# Patient Record
Sex: Male | Born: 1945
Health system: Southern US, Community
[De-identification: ages and names within clinical notes are randomized; demographics above are authoritative.]

## PROBLEM LIST (undated history)

## (undated) DIAGNOSIS — IMO0002 Reserved for concepts with insufficient information to code with codable children: Secondary | ICD-10-CM

## (undated) DIAGNOSIS — M1990 Unspecified osteoarthritis, unspecified site: Secondary | ICD-10-CM

## (undated) DIAGNOSIS — K219 Gastro-esophageal reflux disease without esophagitis: Secondary | ICD-10-CM

## (undated) DIAGNOSIS — E0500 Thyrotoxicosis with diffuse goiter without thyrotoxic crisis or storm: Secondary | ICD-10-CM

## (undated) DIAGNOSIS — R011 Cardiac murmur, unspecified: Secondary | ICD-10-CM

## (undated) DIAGNOSIS — E079 Disorder of thyroid, unspecified: Secondary | ICD-10-CM

## (undated) DIAGNOSIS — J189 Pneumonia, unspecified organism: Secondary | ICD-10-CM

## (undated) DIAGNOSIS — G4730 Sleep apnea, unspecified: Secondary | ICD-10-CM

## (undated) DIAGNOSIS — C61 Malignant neoplasm of prostate: Secondary | ICD-10-CM

## (undated) DIAGNOSIS — Z8719 Personal history of other diseases of the digestive system: Secondary | ICD-10-CM

## (undated) DIAGNOSIS — H269 Unspecified cataract: Secondary | ICD-10-CM

## (undated) DIAGNOSIS — D126 Benign neoplasm of colon, unspecified: Secondary | ICD-10-CM

## (undated) DIAGNOSIS — E785 Hyperlipidemia, unspecified: Secondary | ICD-10-CM

## (undated) DIAGNOSIS — F419 Anxiety disorder, unspecified: Secondary | ICD-10-CM

## (undated) DIAGNOSIS — T7840XA Allergy, unspecified, initial encounter: Secondary | ICD-10-CM

## (undated) HISTORY — PX: COLONOSCOPY: SHX174

## (undated) HISTORY — DX: Anxiety disorder, unspecified: F41.9

## (undated) HISTORY — DX: Disorder of thyroid, unspecified: E07.9

## (undated) HISTORY — DX: Gastro-esophageal reflux disease without esophagitis: K21.9

## (undated) HISTORY — DX: Pneumonia, unspecified organism: J18.9

## (undated) HISTORY — DX: Reserved for concepts with insufficient information to code with codable children: IMO0002

## (undated) HISTORY — DX: Hyperlipidemia, unspecified: E78.5

## (undated) HISTORY — PX: TONSILLECTOMY: SUR1361

## (undated) HISTORY — DX: Unspecified cataract: H26.9

## (undated) HISTORY — PX: CATARACT EXTRACTION W/ INTRAOCULAR LENS  IMPLANT, BILATERAL: SHX1307

## (undated) HISTORY — DX: Allergy, unspecified, initial encounter: T78.40XA

## (undated) HISTORY — PX: PROSTATE BIOPSY: SHX241

## (undated) HISTORY — PX: POLYPECTOMY: SHX149

## (undated) HISTORY — DX: Thyrotoxicosis with diffuse goiter without thyrotoxic crisis or storm: E05.00

## (undated) HISTORY — PX: UPPER GASTROINTESTINAL ENDOSCOPY: SHX188

## (undated) HISTORY — DX: Benign neoplasm of colon, unspecified: D12.6

## (undated) HISTORY — DX: Malignant neoplasm of prostate: C61

---

## 2001-08-12 ENCOUNTER — Other Ambulatory Visit: Admission: RE | Admit: 2001-08-12 | Discharge: 2001-08-12 | Payer: Self-pay | Admitting: *Deleted

## 2005-02-04 ENCOUNTER — Ambulatory Visit: Payer: Self-pay | Admitting: Gastroenterology

## 2005-02-13 ENCOUNTER — Ambulatory Visit: Payer: Self-pay | Admitting: Gastroenterology

## 2005-02-25 DIAGNOSIS — R011 Cardiac murmur, unspecified: Secondary | ICD-10-CM

## 2005-02-25 DIAGNOSIS — G4730 Sleep apnea, unspecified: Secondary | ICD-10-CM

## 2005-02-25 HISTORY — DX: Cardiac murmur, unspecified: R01.1

## 2005-02-25 HISTORY — DX: Sleep apnea, unspecified: G47.30

## 2010-02-05 ENCOUNTER — Encounter: Payer: Self-pay | Admitting: Gastroenterology

## 2010-03-29 NOTE — Letter (Signed)
Summary: Colonoscopy Letter  [facility]  [address]   [place], [place] [address]   Phone: [phone]  Fax: [phone]      February 05, 2010 MRN: [id number]   [facility] [address] [place], [place]  [address]   Dear Hunter Porter,   According to your medical record, it is time for you to schedule a Colonoscopy. The American Cancer Society recommends this procedure as a method to detect early colon cancer. Patients with a family history of colon cancer, or a personal history of colon polyps or inflammatory bowel disease are at increased risk.  This letter has been generated based on the recommendations made at the time of your procedure. If you feel that in your particular situation this may no longer apply, please contact our office.  Please call our office at [phone] to schedule this appointment or to update your records at your earliest convenience.  Thank you for cooperating with [place] to provide you with the very best care possible.   Sincerely,  Claudette Head, M.D.  [facility] [phone]

## 2010-09-19 ENCOUNTER — Encounter: Payer: Self-pay | Admitting: Gastroenterology

## 2010-11-02 ENCOUNTER — Encounter: Payer: Self-pay | Admitting: Gastroenterology

## 2010-11-02 ENCOUNTER — Ambulatory Visit (AMBULATORY_SURGERY_CENTER): Payer: 59 | Admitting: *Deleted

## 2010-11-02 VITALS — Ht 69.0 in | Wt 215.0 lb

## 2010-11-02 DIAGNOSIS — Z1211 Encounter for screening for malignant neoplasm of colon: Secondary | ICD-10-CM

## 2010-11-02 MED ORDER — SUPREP BOWEL PREP KIT 17.5-3.13-1.6 GM/177ML PO SOLN: 1.0000 | ORAL | Status: DC

## 2010-11-16 ENCOUNTER — Encounter: Payer: Self-pay | Admitting: Gastroenterology

## 2010-11-16 ENCOUNTER — Ambulatory Visit (AMBULATORY_SURGERY_CENTER): Payer: 59 | Admitting: Gastroenterology

## 2010-11-16 DIAGNOSIS — Z1211 Encounter for screening for malignant neoplasm of colon: Secondary | ICD-10-CM

## 2010-11-16 DIAGNOSIS — D126 Benign neoplasm of colon, unspecified: Secondary | ICD-10-CM

## 2010-11-16 DIAGNOSIS — Z86010 Personal history of colonic polyps: Secondary | ICD-10-CM

## 2010-11-16 MED ORDER — SODIUM CHLORIDE 0.9 % IV SOLN: 500.0000 mL | INTRAVENOUS | Status: DC

## 2010-11-16 NOTE — Patient Instructions (Signed)
  Follow your discharge instructions.  Continue your medications.  Await Pathology results.

## 2010-11-19 ENCOUNTER — Telehealth: Payer: Self-pay | Admitting: *Deleted

## 2010-11-19 NOTE — Telephone Encounter (Signed)
No answer, no identifier, no message left for the patient.

## 2010-11-21 ENCOUNTER — Encounter: Payer: Self-pay | Admitting: Gastroenterology

## 2011-02-14 ENCOUNTER — Other Ambulatory Visit: Payer: Self-pay | Admitting: Family Medicine

## 2011-02-14 NOTE — Telephone Encounter (Signed)
Please advise refill on Xanax 

## 2012-09-02 ENCOUNTER — Other Ambulatory Visit: Payer: Self-pay | Admitting: Physician Assistant

## 2012-09-02 ENCOUNTER — Encounter: Payer: Self-pay | Admitting: Family Medicine

## 2012-09-02 NOTE — Telephone Encounter (Signed)
Refill given for one month.  Pt due to be seen and also needs fasting labs he did not have done after last visit.  Reminders letters for both sent to patient

## 2012-10-05 ENCOUNTER — Other Ambulatory Visit: Payer: Medicare Other

## 2012-10-05 DIAGNOSIS — E538 Deficiency of other specified B group vitamins: Secondary | ICD-10-CM

## 2012-10-05 DIAGNOSIS — R5381 Other malaise: Secondary | ICD-10-CM

## 2012-10-05 DIAGNOSIS — F419 Anxiety disorder, unspecified: Secondary | ICD-10-CM

## 2012-10-05 DIAGNOSIS — Z79899 Other long term (current) drug therapy: Secondary | ICD-10-CM

## 2012-10-05 DIAGNOSIS — E0500 Thyrotoxicosis with diffuse goiter without thyrotoxic crisis or storm: Secondary | ICD-10-CM

## 2012-10-05 DIAGNOSIS — M1990 Unspecified osteoarthritis, unspecified site: Secondary | ICD-10-CM

## 2012-10-05 DIAGNOSIS — K219 Gastro-esophageal reflux disease without esophagitis: Secondary | ICD-10-CM

## 2012-10-05 DIAGNOSIS — E785 Hyperlipidemia, unspecified: Secondary | ICD-10-CM

## 2012-10-05 LAB — CBC WITH DIFFERENTIAL/PLATELET
Eosinophils Absolute: 0.3 10*3/uL (ref 0.0–0.7)
Eosinophils Relative: 4 % (ref 0–5)
Hemoglobin: 15 g/dL (ref 13.0–17.0)
Lymphocytes Relative: 29 % (ref 12–46)
Lymphs Abs: 2 10*3/uL (ref 0.7–4.0)
MCH: 32.8 pg (ref 26.0–34.0)
MCV: 93 fL (ref 78.0–100.0)
Monocytes Relative: 7 % (ref 3–12)
Neutrophils Relative %: 59 % (ref 43–77)
Platelets: 235 10*3/uL (ref 150–400)
RBC: 4.58 MIL/uL (ref 4.22–5.81)
WBC: 6.7 10*3/uL (ref 4.0–10.5)

## 2012-10-05 LAB — LIPID PANEL
Cholesterol: 209 mg/dL — ABNORMAL HIGH (ref 0–200)
HDL: 48 mg/dL (ref >39)
Total CHOL/HDL Ratio: 4.4 Ratio
VLDL: 49 mg/dL — ABNORMAL HIGH (ref 0–40)

## 2012-10-09 ENCOUNTER — Other Ambulatory Visit: Payer: Self-pay | Admitting: Family Medicine

## 2012-10-09 MED ORDER — NAPROXEN 375 MG PO TABS: 375.0000 mg | ORAL_TABLET | Freq: Two times a day (BID) | ORAL | Status: DC

## 2012-10-09 NOTE — Telephone Encounter (Signed)
Pt has appt next week.  Out of Naproxen having arthritic pain.  One refill sent to pharmacy

## 2012-10-14 ENCOUNTER — Ambulatory Visit (INDEPENDENT_AMBULATORY_CARE_PROVIDER_SITE_OTHER): Payer: Medicare Other | Admitting: Physician Assistant

## 2012-10-14 ENCOUNTER — Encounter: Payer: Self-pay | Admitting: Physician Assistant

## 2012-10-14 VITALS — BP 130/80 | HR 68 | Temp 97.9°F | Resp 18 | Ht 67.5 in | Wt 224.0 lb

## 2012-10-14 DIAGNOSIS — K219 Gastro-esophageal reflux disease without esophagitis: Secondary | ICD-10-CM | POA: Insufficient documentation

## 2012-10-14 DIAGNOSIS — F419 Anxiety disorder, unspecified: Secondary | ICD-10-CM

## 2012-10-14 DIAGNOSIS — R972 Elevated prostate specific antigen [PSA]: Secondary | ICD-10-CM

## 2012-10-14 DIAGNOSIS — E079 Disorder of thyroid, unspecified: Secondary | ICD-10-CM

## 2012-10-14 DIAGNOSIS — F411 Generalized anxiety disorder: Secondary | ICD-10-CM

## 2012-10-14 NOTE — Progress Notes (Signed)
Patient ID: Hunter Porter MRN: [id number], DOB: [birth year], 67 y.o. Date of Encounter: @DATE @  Chief Complaint:  Chief Complaint  Patient presents with  . follow up after lab work    HPI: 67 y.o. year old white male  presents for routine office visits.  He recently came in had labs done. He asked the lab to check a testosterone level also because of recent decreased energy. He had recently heard about low testosterone as a possible cause of this type of symptom. Otherwise he has no complaints today. He states that his reflux has been well controlled with the Nexium. He has continued to have routine followup at [facility] regarding his Luiz Blare' disease but this has not required any type of treatments recently. He does report that he drinks about 6 beers each evening. No other type of alcohol and no increased amount on the weekends. He continues to state that he rarely uses Xanax. He says when he does need one he only takes one fourth of a tablet at a time. He is retired and plays golf 4 days a week. He cleans the house and does yard work.   Past Medical History  Diagnosis Date  . Hyperlipidemia   . Substance abuse   . Ulcer approx 1970    HX peptic ulcer  . Adenomatous colon polyp   . GERD (gastroesophageal reflux disease)   . Anxiety   . Thyroid disease     graves     Home Meds: See attached medication section for current medication list. Any medications entered into computer today will not appear on this note's list. The medications listed below were entered prior to today. Current Outpatient Prescriptions on File Prior to Visit  Medication Sig Dispense Refill  . aspirin 81 MG tablet Take 81 mg by mouth daily.        . fish oil-omega-3 fatty acids 1000 MG capsule Take 1 g by mouth every other day.        . Multiple Vitamins-Minerals (MULTIVITAMIN WITH MINERALS) tablet Take 1 tablet by mouth daily.        . naproxen (NAPROSYN) 375 MG tablet Take 1 tablet (375 mg total) by mouth 2  (two) times daily with a meal.  60 tablet  0  . NEXIUM 40 MG capsule Take 1 tablet by mouth Daily.      . Potassium 99 MG TABS Take 1 tablet by mouth daily.        . vitamin E (VITAMIN E) 400 UNIT capsule Take 400 Units by mouth every other day.         No current facility-administered medications on file prior to visit.    Allergies:  Allergies  Allergen Reactions  . Penicillins Rash    History   Social History  . Marital Status: Married    Spouse Name: N/A    Number of Children: N/A  . Years of Education: N/A   Occupational History  . Not on file.   Social History Main Topics  . Smoking status: Current Every Day Smoker -- 0.80 packs/day    Types: Cigarettes  . Smokeless tobacco: Never Used  . Alcohol Use: 25.2 oz/week    42 Cans of beer per week  . Drug Use: No  . Sexual Activity: Not on file   Other Topics Concern  . Not on file   Social History Narrative   Retired.    Plays golf 4 days/week.   Works in yard.    Drinks  6 beers each evening.    History reviewed. No pertinent family history.   Review of Systems:  See HPI for pertinent ROS. All other ROS negative.  He has had no pelvic pain. No dysuria. No fevers chills. No hematuria. No nocturia or frequency or urgency.   Physical Exam: Blood pressure 130/80, pulse 68, temperature 97.9 F (36.6 C), temperature source Oral, resp. rate 18, height 5' 7.5" (1.715 m), weight 224 lb (101.606 kg)., Body mass index is 34.55 kg/(m^2). General:well-nourished well-developed white male Appears in no acute distress. Neck: Supple. No thyromegaly. No lymphadenopathy.no carotid bruit. Lungs: Clear bilaterally to auscultation without wheezes, rales, or rhonchi. Breathing is unlabored. Heart: RRR with S1 S2. No murmurs, rubs, or gallops. Abdomen: Soft, non-tender, non-distended with normoactive bowel sounds. No hepatomegaly. No rebound/guarding. No obvious abdominal masses. Musculoskeletal:  Strength and tone normal for  age. Extremities/Skin: Warm and dry. No clubbing or cyanosis. No edema. No rashes or suspicious lesions. Digital rectal exam: This is normal. The prostate gland is smooth with no nodules or mass. No tenderness. Neuro: Alert and oriented X 3. Moves all extremities spontaneously. Gait is normal. CNII-XII grossly in tact. Psych:  Responds to questions appropriately with a normal affect.   Results for orders placed in visit on 10/05/12  CBC WITH DIFFERENTIAL      Result Value Range   WBC 6.7  4.0 - 10.5 K/uL   RBC 4.58  4.22 - 5.81 MIL/uL   Hemoglobin 15.0  13.0 - 17.0 g/dL   HCT 16.1  09.6 - 04.5 %   MCV 93.0  78.0 - 100.0 fL   MCH 32.8  26.0 - 34.0 pg   MCHC 35.2  30.0 - 36.0 g/dL   RDW 40.9  81.1 - 91.4 %   Platelets 235  150 - 400 K/uL   Neutrophils Relative % 59  43 - 77 %   Neutro Abs 4.0  1.7 - 7.7 K/uL   Lymphocytes Relative 29  12 - 46 %   Lymphs Abs 2.0  0.7 - 4.0 K/uL   Monocytes Relative 7  3 - 12 %   Monocytes Absolute 0.5  0.1 - 1.0 K/uL   Eosinophils Relative 4  0 - 5 %   Eosinophils Absolute 0.3  0.0 - 0.7 K/uL   Basophils Relative 1  0 - 1 %   Basophils Absolute 0.0  0.0 - 0.1 K/uL   Smear Review Criteria for review not met    LIPID PANEL      Result Value Range   Cholesterol 209 (*) 0 - 200 mg/dL   Triglycerides 782 (*) <150 mg/dL   HDL 48  >95 mg/dL   Total CHOL/HDL Ratio 4.4     VLDL 49 (*) 0 - 40 mg/dL   LDL Cholesterol 621 (*) 0 - 99 mg/dL  VITAMIN H08      Result Value Range   Vitamin B-12 532  211 - 911 pg/mL  PSA, MEDICARE      Result Value Range   PSA 4.32 (*) <=4.00 ng/mL  TSH      Result Value Range   TSH 3.125  0.350 - 4.500 uIU/mL  TESTOSTERONE      Result Value Range   Testosterone 285 (*) 300 - 890 ng/dL     ASSESSMENT AND PLAN:  67 y.o. year old male with  1. Elevated PSA Writer to recent PSA the last PSA was done on 06/22/2010. At that time it was 1.53. The patient reports  no known history of elevated PSA. He states he has never  seen a urologist. He is agreeable to proceed with referral. Discussed possible causes of elevated PSA including BPH prostatitis and prostate cancer. - Ambulatory referral to Urology  2. GERD (gastroesophageal reflux disease) controlled. He will continue the Nexium when necessary.  3. Thyroid disease/Graves Disease TSH above is normal. [facility] was [facility]  4. Anxiety He rarely needs low-dose Xanax. Can continue this when necessary. No Rx is needed today. He will call for needs refill.  #5 screening colonoscopy: History of tubular adenoma polyp. Last colonoscopy was 2012. He is to repeat every 5 years.  #6 alcohol use                   He is to decrease the beer intake. This will help the triglycerides. We discussed this. As well discussed liver damage. #7 hypertriglyceridemia   Signed, Shon Hale [place], [place], [facility] 10/14/2012 4:44 PM

## 2012-11-09 ENCOUNTER — Other Ambulatory Visit: Payer: Self-pay | Admitting: Physician Assistant

## 2012-11-09 NOTE — Telephone Encounter (Signed)
Medication refilled per protocol. 

## 2012-12-01 HISTORY — PX: TRANSURETHRAL RESECTION OF PROSTATE: SHX73

## 2012-12-10 ENCOUNTER — Other Ambulatory Visit: Payer: Self-pay | Admitting: Physician Assistant

## 2012-12-10 NOTE — Telephone Encounter (Signed)
Medication refilled per protocol. 

## 2013-01-11 ENCOUNTER — Other Ambulatory Visit: Payer: Self-pay | Admitting: Physician Assistant

## 2013-01-20 ENCOUNTER — Encounter: Payer: Self-pay | Admitting: Family Medicine

## 2013-01-20 DIAGNOSIS — C61 Malignant neoplasm of prostate: Secondary | ICD-10-CM

## 2013-01-20 HISTORY — DX: Malignant neoplasm of prostate: C61

## 2013-01-27 ENCOUNTER — Other Ambulatory Visit: Payer: Self-pay | Admitting: Urology

## 2013-01-28 ENCOUNTER — Other Ambulatory Visit: Payer: Self-pay | Admitting: Urology

## 2013-03-05 ENCOUNTER — Encounter (HOSPITAL_COMMUNITY): Payer: Self-pay | Admitting: Pharmacy Technician

## 2013-03-11 ENCOUNTER — Inpatient Hospital Stay (HOSPITAL_COMMUNITY): Admission: RE | Admit: 2013-03-11 | Payer: 59 | Source: Ambulatory Visit

## 2013-03-12 ENCOUNTER — Other Ambulatory Visit (HOSPITAL_COMMUNITY): Payer: 59

## 2013-03-15 ENCOUNTER — Encounter (HOSPITAL_COMMUNITY)
Admission: RE | Admit: 2013-03-15 | Discharge: 2013-03-15 | Disposition: A | Discharge status: Home / Self Care | Payer: Medicare Other | Source: Ambulatory Visit | Attending: Urology | Admitting: Urology

## 2013-03-15 ENCOUNTER — Encounter (INDEPENDENT_AMBULATORY_CARE_PROVIDER_SITE_OTHER): Payer: Self-pay

## 2013-03-15 ENCOUNTER — Encounter (HOSPITAL_COMMUNITY): Payer: Self-pay

## 2013-03-15 HISTORY — DX: Unspecified osteoarthritis, unspecified site: M19.90

## 2013-03-15 HISTORY — DX: Sleep apnea, unspecified: G47.30

## 2013-03-15 HISTORY — DX: Cardiac murmur, unspecified: R01.1

## 2013-03-15 HISTORY — DX: Personal history of other diseases of the digestive system: Z87.19

## 2013-03-15 LAB — BASIC METABOLIC PANEL
BUN: 12 mg/dL (ref 6–23)
CO2: 27 meq/L (ref 19–32)
CREATININE: 0.81 mg/dL (ref 0.50–1.35)
Calcium: 9.4 mg/dL (ref 8.4–10.5)
Chloride: 97 mEq/L (ref 96–112)
GFR calc Af Amer: >90 mL/min (ref >90)
GFR calc non Af Amer: >90 mL/min (ref >90)
Glucose, Bld: 99 mg/dL (ref 70–99)
Potassium: 4.2 mEq/L (ref 3.7–5.3)
Sodium: 137 mEq/L (ref 137–147)

## 2013-03-15 LAB — CBC
HCT: 41.7 % (ref 39.0–52.0)
Hemoglobin: 14.4 g/dL (ref 13.0–17.0)
MCH: 32.4 pg (ref 26.0–34.0)
MCHC: 34.5 g/dL (ref 30.0–36.0)
MCV: 93.7 fL (ref 78.0–100.0)
Platelets: 211 10*3/uL (ref 150–400)
RBC: 4.45 MIL/uL (ref 4.22–5.81)
RDW: 12.7 % (ref 11.5–15.5)
WBC: 9 10*3/uL (ref 4.0–10.5)

## 2013-03-15 LAB — ABO/RH: ABO/RH(D): A NEG

## 2013-03-15 NOTE — Patient Instructions (Signed)
[place]  03/15/2013   Your procedure is scheduled on:  03/17/13  Report to [facility] at  [place]     AM.  Call this number if you have problems the morning of surgery: [phone]       Remember:   Do not eat food  :After Midnight. Monday NIGHT-- BEGIN CLEAR LIQUIDS Tuesday MORNING AS DIRECTED BY OFFICE FOR BOWEL PREP.Marland Kitchen NOTHING BY MOUTH AFTER MIDNIGHT Tuesday NIGHT   Take these medicines the morning of surgery with A SIP OF WATER:  NONE   .  Contacts, dentures or partial plates can not be worn to surgery  Leave suitcase in the car. After surgery it may be brought to your room.  For patients admitted to the hospital, checkout time is 11:00 AM day of  discharge.             SPECIAL INSTRUCTIONS- SEE [place] PREPARING FOR SURGERY INSTRUCTION SHEET-     DO NOT WEAR JEWELRY, LOTIONS, POWDERS, OR PERFUMES.  WOMEN-- DO NOT SHAVE LEGS OR UNDERARMS FOR 12 HOURS BEFORE SHOWERS. MEN MAY SHAVE FACE.  Patients discharged the day of surgery will not be allowed to drive home. IF going home the day of surgery, you must have a driver and someone to stay with you for the first 24 hours  Name and phone number of your driver:  [name]                                                                      Please read over the following fact sheets that you were given: MRSA Information, Incentive Spirometry Sheet, Blood Transfusion Sheet  Information                                                                                   Hunter Porter  PST [phone]  [phone]                 [place]                                                  Patient Signature _____________________________

## 2013-03-16 NOTE — Anesthesia Preprocedure Evaluation (Addendum)
Anesthesia Evaluation  Patient identified by MRN, date of birth, ID band Patient awake    Reviewed: Allergy & Precautions, H&P , NPO status , Patient's Chart, lab work & pertinent test results  Airway Mallampati: II TM Distance: >3 FB Neck ROM: full    Dental  (+) Teeth Intact and Dental Advisory Given   Pulmonary neg pulmonary ROS, sleep apnea , Current Smoker,  breath sounds clear to auscultation  Pulmonary exam normal       Cardiovascular Exercise Tolerance: Good negative cardio ROS  Rhythm:regular Rate:Normal     Neuro/Psych negative neurological ROS  negative psych ROS   GI/Hepatic negative GI ROS, Neg liver ROS, hiatal hernia, GERD-  Controlled and Medicated,  Endo/Other  negative endocrine ROSGraves disease.  Eye problems but no thyroid problems  Renal/GU negative Renal ROS  negative genitourinary   Musculoskeletal   Abdominal   Peds  Hematology negative hematology ROS (+)   Anesthesia Other Findings   Reproductive/Obstetrics negative OB ROS                        Anesthesia Physical Anesthesia Plan  ASA: III  Anesthesia Plan: General   Post-op Pain Management:    Induction: Intravenous  Airway Management Planned: Oral ETT  Additional Equipment:   Intra-op Plan:   Post-operative Plan: Extubation in OR  Informed Consent: I have reviewed the patients History and Physical, chart, labs and discussed the procedure including the risks, benefits and alternatives for the proposed anesthesia with the patient or authorized representative who has indicated his/her understanding and acceptance.   Dental Advisory Given  Plan Discussed with: CRNA and Surgeon  Anesthesia Plan Comments:         Anesthesia Quick Evaluation

## 2013-03-17 ENCOUNTER — Encounter (HOSPITAL_COMMUNITY): Payer: Medicare Other | Admitting: Anesthesiology

## 2013-03-17 ENCOUNTER — Encounter (HOSPITAL_COMMUNITY)
Admission: RE | Disposition: A | Discharge status: Home / Self Care | Payer: Self-pay | Source: Ambulatory Visit | Attending: Urology

## 2013-03-17 ENCOUNTER — Encounter (HOSPITAL_COMMUNITY): Payer: Self-pay | Admitting: *Deleted

## 2013-03-17 ENCOUNTER — Inpatient Hospital Stay (HOSPITAL_COMMUNITY): Payer: Medicare Other | Admitting: Anesthesiology

## 2013-03-17 ENCOUNTER — Observation Stay (HOSPITAL_COMMUNITY)
Admission: RE | Admit: 2013-03-17 | Discharge: 2013-03-18 | Disposition: A | Discharge status: Home / Self Care | Payer: Medicare Other | Source: Ambulatory Visit | Attending: Urology | Admitting: Urology

## 2013-03-17 DIAGNOSIS — E785 Hyperlipidemia, unspecified: Secondary | ICD-10-CM | POA: Insufficient documentation

## 2013-03-17 DIAGNOSIS — F17200 Nicotine dependence, unspecified, uncomplicated: Secondary | ICD-10-CM | POA: Insufficient documentation

## 2013-03-17 DIAGNOSIS — E0500 Thyrotoxicosis with diffuse goiter without thyrotoxic crisis or storm: Secondary | ICD-10-CM | POA: Insufficient documentation

## 2013-03-17 DIAGNOSIS — Z23 Encounter for immunization: Secondary | ICD-10-CM | POA: Insufficient documentation

## 2013-03-17 DIAGNOSIS — K219 Gastro-esophageal reflux disease without esophagitis: Secondary | ICD-10-CM | POA: Insufficient documentation

## 2013-03-17 DIAGNOSIS — K449 Diaphragmatic hernia without obstruction or gangrene: Secondary | ICD-10-CM | POA: Insufficient documentation

## 2013-03-17 DIAGNOSIS — Z7982 Long term (current) use of aspirin: Secondary | ICD-10-CM | POA: Insufficient documentation

## 2013-03-17 DIAGNOSIS — C61 Malignant neoplasm of prostate: Principal | ICD-10-CM | POA: Diagnosis present

## 2013-03-17 DIAGNOSIS — Z79899 Other long term (current) drug therapy: Secondary | ICD-10-CM | POA: Insufficient documentation

## 2013-03-17 DIAGNOSIS — Z8711 Personal history of peptic ulcer disease: Secondary | ICD-10-CM | POA: Insufficient documentation

## 2013-03-17 DIAGNOSIS — G4730 Sleep apnea, unspecified: Secondary | ICD-10-CM | POA: Insufficient documentation

## 2013-03-17 HISTORY — PX: LYMPHADENECTOMY: SHX5960

## 2013-03-17 HISTORY — PX: ROBOT ASSISTED LAPAROSCOPIC RADICAL PROSTATECTOMY: SHX5141

## 2013-03-17 LAB — HEMOGLOBIN AND HEMATOCRIT, BLOOD
HCT: 35.6 % — ABNORMAL LOW (ref 39.0–52.0)
HEMOGLOBIN: 12.3 g/dL — AB (ref 13.0–17.0)

## 2013-03-17 LAB — TYPE AND SCREEN
ABO/RH(D): A NEG
Antibody Screen: NEGATIVE

## 2013-03-17 SURGERY — ROBOTIC ASSISTED LAPAROSCOPIC RADICAL PROSTATECTOMY
Anesthesia: General

## 2013-03-17 MED ORDER — GLYCOPYRROLATE 0.2 MG/ML IJ SOLN
INTRAMUSCULAR | Status: DC | PRN
  Administered 2013-03-17: .8 mg via INTRAVENOUS

## 2013-03-17 MED ORDER — MIDAZOLAM HCL 2 MG/2ML IJ SOLN
INTRAMUSCULAR | Status: AC
  Filled 2013-03-17: qty 2

## 2013-03-17 MED ORDER — LIDOCAINE HCL (CARDIAC) 20 MG/ML IV SOLN
INTRAVENOUS | Status: AC
  Filled 2013-03-17: qty 5

## 2013-03-17 MED ORDER — PANTOPRAZOLE SODIUM 40 MG PO TBEC
80.0000 mg | DELAYED_RELEASE_TABLET | Freq: Every day | ORAL | Status: DC
  Administered 2013-03-17 – 2013-03-18 (×2): 80 mg via ORAL
  Filled 2013-03-17 (×2): qty 2

## 2013-03-17 MED ORDER — NEOSTIGMINE METHYLSULFATE 1 MG/ML IJ SOLN
INTRAMUSCULAR | Status: AC
  Filled 2013-03-17: qty 10

## 2013-03-17 MED ORDER — MORPHINE SULFATE 2 MG/ML IJ SOLN: 2.0000 mg | INTRAMUSCULAR | Status: DC | PRN

## 2013-03-17 MED ORDER — BUPIVACAINE-EPINEPHRINE 0.25% -1:200000 IJ SOLN
INTRAMUSCULAR | Status: DC | PRN
  Administered 2013-03-17: 25 mL

## 2013-03-17 MED ORDER — ROCURONIUM BROMIDE 100 MG/10ML IV SOLN
INTRAVENOUS | Status: DC | PRN
  Administered 2013-03-17: 10 mg via INTRAVENOUS
  Administered 2013-03-17: 50 mg via INTRAVENOUS
  Administered 2013-03-17 (×6): 10 mg via INTRAVENOUS

## 2013-03-17 MED ORDER — ACETAMINOPHEN 325 MG PO TABS
650.0000 mg | ORAL_TABLET | ORAL | Status: DC | PRN
  Administered 2013-03-18: 650 mg via ORAL
  Filled 2013-03-17: qty 2

## 2013-03-17 MED ORDER — NEOSTIGMINE METHYLSULFATE 1 MG/ML IJ SOLN
INTRAMUSCULAR | Status: DC | PRN
  Administered 2013-03-17: 4 mg via INTRAVENOUS

## 2013-03-17 MED ORDER — METHYLENE BLUE 1 % INJ SOLN
INTRAMUSCULAR | Status: DC | PRN
  Administered 2013-03-17: 5 mL via INTRAVENOUS

## 2013-03-17 MED ORDER — ALPRAZOLAM 0.25 MG PO TABS
0.2500 mg | ORAL_TABLET | Freq: Two times a day (BID) | ORAL | Status: DC | PRN
  Administered 2013-03-17: 0.25 mg via ORAL
  Filled 2013-03-17: qty 1

## 2013-03-17 MED ORDER — ROCURONIUM BROMIDE 100 MG/10ML IV SOLN
INTRAVENOUS | Status: AC
  Filled 2013-03-17: qty 1

## 2013-03-17 MED ORDER — HYDROMORPHONE HCL PF 2 MG/ML IJ SOLN
INTRAMUSCULAR | Status: AC
  Filled 2013-03-17: qty 1

## 2013-03-17 MED ORDER — FENTANYL CITRATE 0.05 MG/ML IJ SOLN
INTRAMUSCULAR | Status: DC | PRN
  Administered 2013-03-17: 100 ug via INTRAVENOUS
  Administered 2013-03-17 (×3): 50 ug via INTRAVENOUS

## 2013-03-17 MED ORDER — STERILE WATER FOR IRRIGATION IR SOLN
Status: DC | PRN
  Administered 2013-03-17: 3000 mL

## 2013-03-17 MED ORDER — MIDAZOLAM HCL 5 MG/5ML IJ SOLN
INTRAMUSCULAR | Status: DC | PRN
  Administered 2013-03-17: 2 mg via INTRAVENOUS

## 2013-03-17 MED ORDER — GLYCOPYRROLATE 0.2 MG/ML IJ SOLN
INTRAMUSCULAR | Status: AC
  Filled 2013-03-17: qty 4

## 2013-03-17 MED ORDER — ONDANSETRON HCL 4 MG/2ML IJ SOLN
INTRAMUSCULAR | Status: DC | PRN
  Administered 2013-03-17: 4 mg via INTRAVENOUS

## 2013-03-17 MED ORDER — SODIUM CHLORIDE 0.9 % IV BOLUS (SEPSIS)
1000.0000 mL | Freq: Once | INTRAVENOUS | Status: AC
  Administered 2013-03-17: 1000 mL via INTRAVENOUS

## 2013-03-17 MED ORDER — PROPOFOL 10 MG/ML IV BOLUS
INTRAVENOUS | Status: DC | PRN
  Administered 2013-03-17: 200 mg via INTRAVENOUS

## 2013-03-17 MED ORDER — PNEUMOCOCCAL VAC POLYVALENT 25 MCG/0.5ML IJ INJ
0.5000 mL | INJECTION | INTRAMUSCULAR | Status: AC
  Administered 2013-03-18: 0.5 mL via INTRAMUSCULAR
  Filled 2013-03-17 (×3): qty 0.5

## 2013-03-17 MED ORDER — LACTATED RINGERS IV SOLN: INTRAVENOUS | Status: DC

## 2013-03-17 MED ORDER — LIDOCAINE HCL (CARDIAC) 20 MG/ML IV SOLN
INTRAVENOUS | Status: DC | PRN
  Administered 2013-03-17: 50 mg via INTRAVENOUS

## 2013-03-17 MED ORDER — CEFAZOLIN SODIUM-DEXTROSE 2-3 GM-% IV SOLR
INTRAVENOUS | Status: AC
  Filled 2013-03-17: qty 50

## 2013-03-17 MED ORDER — BUPIVACAINE-EPINEPHRINE PF 0.25-1:200000 % IJ SOLN
INTRAMUSCULAR | Status: AC
  Filled 2013-03-17: qty 30

## 2013-03-17 MED ORDER — DEXTROSE-NACL 5-0.45 % IV SOLN
INTRAVENOUS | Status: DC
  Administered 2013-03-17 – 2013-03-18 (×3): via INTRAVENOUS

## 2013-03-17 MED ORDER — FENTANYL CITRATE 0.05 MG/ML IJ SOLN
INTRAMUSCULAR | Status: AC
  Filled 2013-03-17: qty 5

## 2013-03-17 MED ORDER — METHYLENE BLUE 1 % INJ SOLN
INTRAMUSCULAR | Status: AC
  Filled 2013-03-17: qty 10

## 2013-03-17 MED ORDER — HYDROMORPHONE HCL PF 1 MG/ML IJ SOLN: 0.2500 mg | INTRAMUSCULAR | Status: DC | PRN

## 2013-03-17 MED ORDER — LACTATED RINGERS IV SOLN
INTRAVENOUS | Status: DC | PRN
  Administered 2013-03-17: 10:00:00

## 2013-03-17 MED ORDER — PROPOFOL 10 MG/ML IV BOLUS
INTRAVENOUS | Status: AC
  Filled 2013-03-17: qty 20

## 2013-03-17 MED ORDER — SODIUM CHLORIDE 0.9 % IR SOLN
Status: DC | PRN
  Administered 2013-03-17: 1000 mL via INTRAVESICAL

## 2013-03-17 MED ORDER — CEFAZOLIN SODIUM-DEXTROSE 2-3 GM-% IV SOLR
INTRAVENOUS | Status: DC | PRN
  Administered 2013-03-17: 2 g via INTRAVENOUS

## 2013-03-17 MED ORDER — ONDANSETRON HCL 4 MG/2ML IJ SOLN
INTRAMUSCULAR | Status: AC
  Filled 2013-03-17: qty 2

## 2013-03-17 MED ORDER — OXYCODONE-ACETAMINOPHEN 5-325 MG PO TABS
1.0000 | ORAL_TABLET | ORAL | Status: DC | PRN
  Administered 2013-03-17 – 2013-03-18 (×2): 1 via ORAL
  Filled 2013-03-17 (×2): qty 1

## 2013-03-17 MED ORDER — CIPROFLOXACIN HCL 500 MG PO TABS: 500.0000 mg | ORAL_TABLET | Freq: Two times a day (BID) | ORAL | Status: DC

## 2013-03-17 MED ORDER — LACTATED RINGERS IV SOLN
INTRAVENOUS | Status: DC | PRN
  Administered 2013-03-17 (×3): via INTRAVENOUS

## 2013-03-17 MED ORDER — HEPARIN SODIUM (PORCINE) 1000 UNIT/ML IJ SOLN
INTRAMUSCULAR | Status: AC
  Filled 2013-03-17: qty 1

## 2013-03-17 MED ORDER — HYDROMORPHONE HCL PF 1 MG/ML IJ SOLN
INTRAMUSCULAR | Status: DC | PRN
  Administered 2013-03-17 (×4): 0.5 mg via INTRAVENOUS
  Administered 2013-03-17: 1 mg via INTRAVENOUS
  Administered 2013-03-17 (×2): 0.5 mg via INTRAVENOUS

## 2013-03-17 MED ORDER — KETOROLAC TROMETHAMINE 30 MG/ML IJ SOLN
30.0000 mg | Freq: Four times a day (QID) | INTRAMUSCULAR | Status: DC
  Administered 2013-03-17 – 2013-03-18 (×4): 30 mg via INTRAVENOUS
  Filled 2013-03-17 (×6): qty 1

## 2013-03-17 MED ORDER — OXYCODONE-ACETAMINOPHEN 5-325 MG PO TABS: 1.0000 | ORAL_TABLET | Freq: Four times a day (QID) | ORAL | Status: DC | PRN

## 2013-03-17 SURGICAL SUPPLY — 48 items
APPLICATOR SURGIFLO ENDO (HEMOSTASIS) ×3 IMPLANT
CABLE HIGH FREQUENCY MONO STRZ (ELECTRODE) ×3 IMPLANT
CANISTER SUCTION 2500CC (MISCELLANEOUS) ×3 IMPLANT
CATH FOLEY 2WAY SLVR 18FR 30CC (CATHETERS) ×3 IMPLANT
CATH ROBINSON RED A/P 16FR (CATHETERS) ×3 IMPLANT
CATH ROBINSON RED A/P 8FR (CATHETERS) ×3 IMPLANT
CATH TIEMANN FOLEY 18FR 5CC (CATHETERS) ×3 IMPLANT
CHLORAPREP W/TINT 26ML (MISCELLANEOUS) ×3 IMPLANT
CLIP LIGATING HEM O LOK PURPLE (MISCELLANEOUS) ×3 IMPLANT
CLOTH BEACON ORANGE TIMEOUT ST (SAFETY) ×3 IMPLANT
COVER SURGICAL LIGHT HANDLE (MISCELLANEOUS) ×3 IMPLANT
COVER TIP SHEARS 8 DVNC (MISCELLANEOUS) ×2 IMPLANT
COVER TIP SHEARS 8MM DA VINCI (MISCELLANEOUS) ×1
CUTTER ECHEON FLEX ENDO 45 340 (ENDOMECHANICALS) ×3 IMPLANT
DECANTER SPIKE VIAL GLASS SM (MISCELLANEOUS) IMPLANT
DRAPE SURG IRRIG POUCH 19X23 (DRAPES) ×3 IMPLANT
DRSG TEGADERM 2-3/8X2-3/4 SM (GAUZE/BANDAGES/DRESSINGS) ×12 IMPLANT
DRSG TEGADERM 4X4.75 (GAUZE/BANDAGES/DRESSINGS) ×6 IMPLANT
DRSG TEGADERM 6X8 (GAUZE/BANDAGES/DRESSINGS) ×6 IMPLANT
ELECT REM PT RETURN 9FT ADLT (ELECTROSURGICAL) ×3
ELECTRODE REM PT RTRN 9FT ADLT (ELECTROSURGICAL) ×2 IMPLANT
GAUZE SPONGE 2X2 8PLY STRL LF (GAUZE/BANDAGES/DRESSINGS) IMPLANT
GLOVE BIO SURGEON STRL SZ 6.5 (GLOVE) ×3 IMPLANT
GLOVE BIOGEL M STRL SZ7.5 (GLOVE) ×6 IMPLANT
GOWN STRL REUS W/TWL LRG LVL3 (GOWN DISPOSABLE) ×3 IMPLANT
GOWN STRL REUS W/TWL XL LVL3 (GOWN DISPOSABLE) ×6 IMPLANT
HOLDER FOLEY CATH W/STRAP (MISCELLANEOUS) ×3 IMPLANT
IV LACTATED RINGERS 1000ML (IV SOLUTION) ×3 IMPLANT
KIT ACCESSORY DA VINCI DISP (KITS) ×1
KIT ACCESSORY DVNC DISP (KITS) ×2 IMPLANT
NDL SAFETY ECLIPSE 18X1.5 (NEEDLE) ×2 IMPLANT
NEEDLE HYPO 18GX1.5 SHARP (NEEDLE) ×1
PACK ROBOT UROLOGY CUSTOM (CUSTOM PROCEDURE TRAY) ×3 IMPLANT
RELOAD GREEN ECHELON 45 (STAPLE) ×3 IMPLANT
SEALER TISSUE G2 CVD JAW 45CM (ENDOMECHANICALS) IMPLANT
SET TUBE IRRIG SUCTION NO TIP (IRRIGATION / IRRIGATOR) ×3 IMPLANT
SOLUTION ELECTROLUBE (MISCELLANEOUS) ×3 IMPLANT
SPONGE GAUZE 2X2 STER 10/PKG (GAUZE/BANDAGES/DRESSINGS)
SURGIFLO W/THROMBIN 8M KIT (HEMOSTASIS) ×6 IMPLANT
SUT ETHILON 3 0 PS 1 (SUTURE) ×3 IMPLANT
SUT VIC AB 2-0 SH 27 (SUTURE) ×2
SUT VIC AB 2-0 SH 27X BRD (SUTURE) ×4 IMPLANT
SUT VICRYL 0 UR6 27IN ABS (SUTURE) ×3 IMPLANT
SUT VLOC BARB 180 ABS3/0GR12 (SUTURE) ×6
SUTURE VLOC BRB 180 ABS3/0GR12 (SUTURE) ×4 IMPLANT
SYR 27GX1/2 1ML LL SAFETY (SYRINGE) ×3 IMPLANT
TOWEL OR NON WOVEN STRL DISP B (DISPOSABLE) ×3 IMPLANT
WATER STERILE IRR 1500ML POUR (IV SOLUTION) ×6 IMPLANT

## 2013-03-17 NOTE — Progress Notes (Signed)
Pt states that he does not wear and CPAP at home and does not want to wear one he is fine.

## 2013-03-17 NOTE — Care Management Note (Addendum)
    Page 1 of 1   03/18/2013     12:51:04 PM   CARE MANAGEMENT NOTE 03/18/2013  Patient:  Hunter Porter, Hunter Porter   Account Number:  [id number]  Date Initiated:  03/17/2013  Documentation initiated by:  Dessa Phi  Subjective/Objective Assessment:   68 Y/O M ADMITTED W/ADENO PROSTATE CA.     Action/Plan:   FROM HOME ALONE.HAS PCP,PHARMACY.   Anticipated DC Date:  03/18/2013   Anticipated DC Plan:  [place]  CM consult      Choice offered to / List presented to:             Status of service:  Completed, signed off Medicare Important Message given?   (If response is "NO", the following Medicare IM given date fields will be blank) Date Medicare IM given:   Date Additional Medicare IM given:    Discharge Disposition:  HOME/SELF CARE  Per UR Regulation:  Reviewed for med. necessity/level of care/duration of stay  If discussed at [place] of Stay Meetings, dates discussed:    Comments:  03/18/13 Nason Conradt RN,BSN NCM [phone] D/C HOME NO NEEDS OR ORDERS.  03/17/13 Lashaya Kienitz RN,BSN NCM [phone] S/P LAP RAD PROSTATECTOMY.NO ANTICIPATED D/C NEEDS.

## 2013-03-17 NOTE — Progress Notes (Signed)
Patient ID: Hunter Porter, male   DOB: 11/02/1945, 68 y.o.   MRN: [id number] Post-op note  Subjective: The patient is doing well.  No complaints.  Denies N/V.  Has not amb yet.  Objective: Vital signs in last 24 hours: Temp:  [97 F (36.1 C)-97.9 F (36.6 C)] 97.4 F (36.3 C) (01/21 1350) Pulse Rate:  [73-80] 73 (01/21 1434) Resp:  [12-18] 14 (01/21 1434) BP: (127-177)/(67-95) 127/67 mmHg (01/21 1434) SpO2:  [94 %-99 %] 99 % (01/21 1434) Weight:  [97.977 kg (216 lb)] 97.977 kg (216 lb) (01/21 1350)  Intake/Output from previous day:   Intake/Output this shift: Total I/O In: 3000 [I.V.:3000] Out: 395 [Urine:100; Drains:95; Blood:200]  Physical Exam:  General: Alert and oriented. Abdomen: Soft, Nondistended. Incisions: Clean and dry.  Lab Results:  Recent Labs  03/15/13 1400 03/17/13 1245  HGB 14.4 12.3*  HCT 41.7 35.6*    Assessment/Plan: POD#0   1) Continue to monitor  2) Pain control, IS, amb, clears, DVT prophy    LOS: 0 days   Marcie Bal. 03/17/2013, 4:41 PM

## 2013-03-17 NOTE — Anesthesia Postprocedure Evaluation (Signed)
  Anesthesia Post-op Note  Patient: Hunter Porter  Procedure(s) Performed: Procedure(s) (LRB): ROBOTIC ASSISTED LAPAROSCOPIC RADICAL PROSTATECTOMY (N/A) LYMPHADENECTOMY PELVIC LYMPH NODE DISSECTION (Bilateral)  Patient Location: PACU  Anesthesia Type: General  Level of Consciousness: awake and alert   Airway and Oxygen Therapy: Patient Spontanous Breathing  Post-op Pain: mild  Post-op Assessment: Post-op Vital signs reviewed, Patient's Cardiovascular Status Stable, Respiratory Function Stable, Patent Airway and No signs of Nausea or vomiting  Last Vitals:  Filed Vitals:   03/17/13 1215  BP: 177/95  Pulse:   Temp:   Resp:     Post-op Vital Signs: stable   Complications: No apparent anesthesia complications

## 2013-03-17 NOTE — Op Note (Signed)
Preoperative diagnosis: Clinical stage T1c Adenocarcinoma prostate  Postoperative diagnosis: Same  Procedure: Robotic-assisted laparoscopic radical retropubic prostatectomy with bilateral pelvic lymph node dissection  Surgeon: [name], MD  Asst.: Leta Baptist, PA Anesthesia: Gen. Endotracheal  Indications: Patient was diagnosed with clinical stage TIc Adenocarcinoma the prostate. He underwent extensive consultation with regard to treatment options. The patient decided on a surgical approach. He appeared to understand the distinct advantages as well as the disadvantages of this procedure. The patient has performed a mechanical bowel prep. He has had placement of PAS compression boots and has received perioperative antibiotics. Ultrasound revealed a 30 g prostate.   Technique and findings:The patient was brought to the operating room and had successful induction of general endotracheal anesthesia.the patient was placed in a low lithotomy position with careful padding of all extremities. He was secured to the operative table and placed in the steep Trendelenburg position. He was prepped and draped in usual manner. A Foley catheter was placed sterilely on the field. Camera port site was chosen 18 cm above the pubic symphysis just to the left of the umbilicus. A standard open Hassan technique was utilized. A 12 mm trocar was placed without difficulty. The camera was then inserted and no abnormalities were noted within the pelvis. The trochars were placed with direct visual guidance. This included 3 57mm robotic trochars and a 12 mm and 5 mm assist ports. Once all the ports were placed the robot was docked. The bladder was filled and the space of Retzius was developed with electrocautery dissection as well as blunt dissection. Superficial fat off the endopelvic fascia and bladder neck was removed with electrocautery scissors. The endopelvic fascia was then incised bilaterally from base to apex. Levator  musculature was swept off the apex of the prostate isolating the dorsal venous complex which was then stapled with the ETS stapling device. The anterior bladder neck was identified with the aid of the Foley balloon. This was then transected down to the Foley catheter with electrocautery scissors. The Foley catheter was then retracted anteriorly. The posterior bladder neck was then transected and the dissection carried down to the adnexal structures. The seminal vesicles and vas deferens on both sides were then individually dissected free and retracted anteriorly. The posterior plane between the rectum and prostate was then established primarily with blunt dissection.  Attention was then turned towards nerve sparing. The patient was felt to be a candidate for left-sided nerve sparing only and wide excision on the right side. Superficial fascia along the anterior lateral aspect of the prostate was incised on the left. This tissue was then swept laterally until we were able to establish a groove between the neurovascular tissue and the posterior lateral aspect on the prostate. This groove was then extended from the apex back to the base of the prostate. With the prostate retracted anteriorly the vascular pedicles of the prostate were taken with the Enseal device widely on the right side. On the left we used clips and continued to hug near the capsule of the prostate. The Foley catheter was then reinserted and the anterior urethra was transected. The posterior urethra was then transected as were some rectourethralis fibers. The prostate was then removed from the pelvis. The pelvis was then copiously irrigated. Rectal insufflation was performed and there was no evidence of rectal injury.  Attention was then turned towards bilateral pelvic lymph node dissection. The obturator node packets were removed I laterally and the dissection extended towards the bifurcation of the  iliac artery. The obturator nerve was identified  on both sides and preserved. Hemalock clips were used for small veins and lymphatic channels. The node packets were sent for permanent analysis.  Attention was then turned towards reconstruction. The bladder neck did require some reconstruction. A figure-of-eight suture was placed at both lung 9:00 and 3:00 positions to narrow the bladder neck.. The bladder neck and posterior urethra were reapproximated at the 6:00 position utilizing a 2-0 Vicryl suture. The rest of the anastomosis was done with a double-armed 3-0V LOC suture in a 360 degree manner. A new catheter was placed and bladder irrigation revealed no evidence of leakage. A Blake drain was placed through one of the robotic trochars and positioned in the retropubic space above the anastomosis. This was then secured to the skin with a nylon suture. The prostate was placed in the Endopouch retrieval bag. The 12 mm trocar site was closed with a Vicryl suture with the aid of a suture passer. Our other trochars were taken out with direct visual guidance without evidence of any bleeding. The camera port incision was extended slightly to allow for removal of the specimen and then closed with a running Vicryl suture. All port sites were infiltrated with Marcaine and then closed with surgical clips. The patient was then taken to recovery room having had no obvious complications or problems. Sponge and needle counts were correct.

## 2013-03-17 NOTE — H&P (Signed)
Reason For Visit Hunter Porter presents today for RLRRP with PLND  SHIM 20/25   IPSS 3/0   History of Present Illness   Prostate cancer history:    Hunter Porter returns status post his prostate ultrasound and biopsy performed in October 2014. He had a PSA that was minimally elevated, but there was a significant change from the year prior. In addition, I thought that there was some induration in the right lobe of his prostate. At the time of ultrasonography, we did not see anything of concern. The prostate was otherwise symmetric without any obvious capsular extension. Prostate was approximately 30 grams in size. Unfortunately, biopsies were positive. On the left, there was a single core positive for Gleason 3+3=6 cancer that was low volume. On the right side, however, 6/6 cores positive, all for Gleason 7 cancer. He primarily has a Gleason 4 component. Core involvement was 50% to 90%. He has had no problems or issues status post the biopsy.   Past Medical History Problems  1. History of Anxiety (300.00) 2. History of Graves' Disease (242.00) 3. History of heartburn (V12.79) 4. History of hyperlipidemia (V12.29) 5. History of Peptic Ulcer (V12.71) 6. History of Substance Use Disorders (305.90)  Current Meds 1. Adult Aspirin Low Strength 81 MG Oral Tablet Dispersible;  Therapy: (Recorded:29Aug2014) to Recorded 2. Fish Oil 1000 MG Oral Capsule;  Therapy: (Recorded:29Aug2014) to Recorded 3. Naproxen 375 MG Oral Tablet;  Therapy: [id number] to Recorded 4. NexIUM 40 MG Oral Capsule Delayed Release;  Therapy: 21Oct2013 to Recorded 5. Potassium 99 MG Oral Tablet;  Therapy: (Recorded:29Aug2014) to Recorded 6. Vitamin E 100 UNIT Oral Tablet;  Therapy: (Recorded:29Aug2014) to Recorded 7. Xanax TABS;  Therapy: (Recorded:29Aug2014) to Recorded  Allergies Medication  1. Penicillins  Family History Problems  1. Family history of Brain Cancer (V16.8) : Mother 2. Family history of Death  In The Family Father : Father   54yrs 3. Family history of Death In The Family Mother : Mother   54yrs 4. Family history of Family Health Status Number Of Children   1 son and 2 daughters  Social History Problems  1. Alcohol Use   drinks 6 beers qd (evening) 2. Caffeine Use   2c qd 3. Marital History - Single 4. Occupation:   Retired 28. Tobacco use (305.1)   1 1/2 ppd for 5yrs  Review of Systems Genitourinary, constitutional, skin, eye, otolaryngeal, hematologic/lymphatic, cardiovascular, pulmonary, endocrine, musculoskeletal, gastrointestinal, neurological and psychiatric system(s) were reviewed and pertinent findings if present are noted.  Genitourinary: nocturia.    Vitals Vital Signs [Data Includes: Last 1 Day]   Blood Pressure: 129 / 72 Temperature: 97.2 F Heart Rate: 80  Physical Exam Constitutional: Well nourished and well developed . No acute distress.  Pulmonary: No respiratory distress and normal respiratory rhythm and effort.  Cardiovascular: Heart rate and rhythm are normal . No peripheral edema.  Abdomen: The abdomen is soft and nontender. No masses are palpated. No CVA tenderness. No hernias are palpable. No hepatosplenomegaly noted.  Genitourinary: Examination of the penis demonstrates no discharge, no masses, no lesions and a normal meatus. The scrotum is without lesions. The right epididymis is palpably normal and non-tender. The left epididymis is palpably normal and non-tender. The right testis is non-tender and without masses. The left testis is non-tender and without masses.  Skin: Normal skin turgor, no visible rash and no visible skin lesions.  Neuro/Psych:. Mood and affect are appropriate.    Assessment Assessed  1. Prostate cancer (185)  Plan Prostate cancer  1. Follow-up Schedule Surgery Office  Follow-up  already gave green sheet to scheduling  Status: Hold For - Appointment  Requested for:  [id number]  Discussion/Summary  The  patient was counseled about the natural history of prostate cancer and the standard treatment options that are available for prostate cancer. It was explained to him how his age and life expectancy, clinical stage, Gleason score, and PSA affect his prognosis, the decision to proceed with additional staging studies, as well as how that information influences recommended treatment strategies. We discussed the roles for active surveillance, radiation therapy, surgical therapy, androgen deprivation, as well as ablative therapy options for the treatment of prostate cancer as appropriate to his individual cancer situation. We discussed the risks and benefits of these options with regard to their impact on cancer control and also in terms of potential adverse events, complications, and impact on quiality of life particularly related to urinary, bowel, and sexual function. The patient was encouraged to ask questions throughout the discussion today and all questions were answered to his stated satisfaction. In addition, the patient was provided with and/or directed to appropriate resources and literature for further education about prostate cancer and treatment options.   We discussed surgical therapy for prostate cancer including the different available surgical approaches. We discussed, in detail, the risks and expectations of surgery with regard to cancer control, urinary control, and erectile function as well as the expected postoperative recovery process. The risks, potential complications/adverse events of radical prostatectomy as well as alternative options were explained to the patient.   We discussed surgical therapy for prostate cancer including the different available surgical approaches. We discussed, in detail, the risks and expectations of surgery with regard to cancer control, urinary control, and erectile function as well as the expected postoperative recovery process. Additional risks of surgery including  but not limited to bleeding, infection, hernia formation, nerve damage, lymphocele formation, bowel/rectal injury potentially necessitating colostomy, damage to the urinary tract resulting in urine leakage, urethral stricture, and the cardiopulmonary risks such as myocardial infarction, stroke, death, venothromboembolism, etc. were explained. The risk of open surgical conversion for robotic/laparoscopic prostatectomy was also discussed.   45 minutes were spent in face to face consultation with patient today.       Wants surgery. Would recommend B PLND with unilateral nerve spare and wide excision on right.  SLOAN NOMO: T2a--ECE 40%,  SVE 2%, LNI 2.3%

## 2013-03-17 NOTE — Transfer of Care (Signed)
Immediate Anesthesia Transfer of Care Note  Patient: Hunter Porter  Procedure(s) Performed: Procedure(s) (LRB): ROBOTIC ASSISTED LAPAROSCOPIC RADICAL PROSTATECTOMY (N/A) LYMPHADENECTOMY PELVIC LYMPH NODE DISSECTION (Bilateral)  Patient Location: PACU  Anesthesia Type: General  Level of Consciousness: sedated, patient cooperative and responds to stimulation  Airway & Oxygen Therapy: Patient Spontanous Breathing and Patient connected to face mask oxgen  Post-op Assessment: Report given to PACU RN and Post -op Vital signs reviewed and stable  Post vital signs: Reviewed and stable  Complications: No apparent anesthesia complications

## 2013-03-18 LAB — BASIC METABOLIC PANEL
BUN: 9 mg/dL (ref 6–23)
CHLORIDE: 101 meq/L (ref 96–112)
CO2: 25 mEq/L (ref 19–32)
CREATININE: 1.19 mg/dL (ref 0.50–1.35)
Calcium: 7.6 mg/dL — ABNORMAL LOW (ref 8.4–10.5)
GFR calc non Af Amer: 62 mL/min — ABNORMAL LOW (ref >90)
GFR, EST AFRICAN AMERICAN: 72 mL/min — AB (ref >90)
Glucose, Bld: 123 mg/dL — ABNORMAL HIGH (ref 70–99)
Potassium: 3.2 mEq/L — ABNORMAL LOW (ref 3.7–5.3)
Sodium: 137 mEq/L (ref 137–147)

## 2013-03-18 LAB — CREATININE, FLUID (PLEURAL, PERITONEAL, JP DRAINAGE): Creat, Fluid: 1.2 mg/dL

## 2013-03-18 LAB — HEMOGLOBIN AND HEMATOCRIT, BLOOD
HCT: 32.5 % — ABNORMAL LOW (ref 39.0–52.0)
HCT: 33.5 % — ABNORMAL LOW (ref 39.0–52.0)
HEMOGLOBIN: 10.9 g/dL — AB (ref 13.0–17.0)
Hemoglobin: 11.7 g/dL — ABNORMAL LOW (ref 13.0–17.0)

## 2013-03-18 MED ORDER — BISACODYL 10 MG RE SUPP
10.0000 mg | Freq: Once | RECTAL | Status: AC
Start: 2013-03-18 — End: 2013-03-18
  Administered 2013-03-18: 10 mg via RECTAL
  Filled 2013-03-18: qty 1

## 2013-03-18 MED ORDER — NICOTINE 21 MG/24HR TD PT24
21.0000 mg | MEDICATED_PATCH | Freq: Every day | TRANSDERMAL | Status: DC
  Administered 2013-03-18: 21 mg via TRANSDERMAL
  Filled 2013-03-18: qty 1

## 2013-03-18 NOTE — Progress Notes (Signed)
Patient ID: Hunter Porter, male   DOB: [birth year], 68 y.o.   MRN: [id number] 1 Day Post-Op Subjective: The patient is doing well.  No nausea or vomiting. Pain is adequately controlled.  He amb without difficulty. H/H dropped 4g post op-- Asymptomatic.  Still with significant drainage from JP with no evidence of decrease this morning.   Objective: Vital signs in last 24 hours: Temp:  [97 F (36.1 C)-99.1 F (37.3 C)] 98.8 F (37.1 C) (01/22 0556) Pulse Rate:  [66-77] 67 (01/22 0556) Resp:  [12-16] 16 (01/22 0556) BP: (107-177)/(56-95) 116/63 mmHg (01/22 0556) SpO2:  [94 %-100 %] 97 % (01/22 0556) Weight:  [97.977 kg (216 lb)] 97.977 kg (216 lb) (01/21 1350)  Intake/Output from previous day: 01/21 0701 - 01/22 0700 In: 5384.2 [P.O.:480; I.V.:4904.2] Out: 1365 [Urine:950; Drains:215; Blood:200] Intake/Output this shift:    Physical Exam:  General: Alert and oriented. CV: RRR Lungs: Clear bilaterally. GI: Soft, Nondistended. Incisions: Dressings intact. Urine: blue Extremities: Nontender, no erythema, no edema.  Lab Results:  Recent Labs  03/15/13 1400 03/17/13 1245 03/18/13 0526  HGB 14.4 12.3* 10.9*  HCT 41.7 35.6* 32.5*      Assessment/Plan: POD# 1 s/p robotic prostatectomy.  1) SL IVF 2) Ambulate, Incentive spirometry 3) Transition to oral pain medication 4) Dulcolax suppository 5) Send JP fluid for Cr 6) Recheck H/H at 10:00 to ensure stable 7) Poss d/c later today pending above    LOS: 1 day   YARBROUGH,Roch Quach G. 03/18/2013, 7:32 AM

## 2013-03-18 NOTE — Discharge Instructions (Signed)
1. Activity:  You are encouraged to ambulate frequently (about every hour during waking hours) to help prevent blood clots from forming in your legs or lungs.  However, you should not engage in any heavy lifting (> 10-15 lbs), strenuous activity, or straining. 2. Diet: You should continue a clear liquid diet until passing gas from below.  Once this occurs, you may advance your diet to a soft diet that would be easy to digest (i.e soups, scrambled eggs, mashed potatoes, etc.) for 24 hours just as you would if getting over a bad stomach flu.  If tolerating this diet well for 24 hours, you may then begin eating regular food.  It will be normal to have some amount of bloating, nausea, and abdominal discomfort intermittently. 3. Prescriptions:  You will be provided a prescription for pain medication to take as needed.  If your pain is not severe enough to require the prescription pain medication, you may take Tylenol instead.  Use a laxative the first night home and then as needed to start bowel movement.  You should also take an over the counter stool softener (Colace 100 mg twice daily) to avoid straining with bowel movements as the pain medication may constipate you. Finally, you will also be provided a prescription for an antibiotic to begin the day prior to your return visit in the office for catheter removal. 4. Catheter care: You will be taught how to take care of the catheter by the nursing staff prior to discharge from the hospital.  You may use both a leg bag and the larger bedside bag but it is recommended to use the bigger bedside bag at nighttime as the leg bag is small and will fill up overnight and also does not drain as well when lying flat. You may periodically feel a strong urge to void with the catheter in place.  This is a bladder spasm and most often can occur when having a bowel movement or when you are moving around. It is typically self-limited and usually will stop after a few minutes.  You  may use some Vaseline or Neosporin around the tip of the catheter to reduce friction at the tip of the penis. 5. Incisions: You may remove your dressing bandages the 2nd day after surgery.  You most likely will have a few small staples in each of the incisions and once the bandages are removed, the incisions may stay open to air.  You may start showering (not soaking or bathing in water) 48 hours after surgery and the incisions simply need to be patted dry after the shower.  No additional care is needed. 6. What to call us about: You should call the office [phone]) if you develop fever > 101, persistent vomiting, or the catheter stops draining. Also, feel free to call with any other questions you may have and remember the handout that was provided to you as a reference preoperatively which answers many of the common questions that arise after surgery.  You may resume aspirin, vitamins, and supplements 7 days after surgery.

## 2013-03-18 NOTE — Progress Notes (Signed)
Patient discharged home with son, discharge instructions given and explained to patient/son and they verbalized understanding, denies any pain/distress. Surgical incision dressing  Clean/dry/intact. No other wound noted. Patient accompanied home by son.

## 2013-03-18 NOTE — Discharge Summary (Signed)
  Date of admission: 03/17/2013  Date of discharge: 03/18/2013  Admission diagnosis: Prostate Cancer  Discharge diagnosis: Prostate Cancer  History and Physical: For full details, please see admission history and physical. Briefly, Hunter Porter is a 68 y.o. gentleman with localized prostate cancer.  After discussing management/treatment options, he elected to proceed with surgical treatment.  Hospital Course: [facility] was taken to the operating room on 03/17/2013 and underwent a robotic assisted laparoscopic radical prostatectomy. He tolerated this procedure well and without complications. Postoperatively, he was able to be transferred to a regular hospital room following recovery from anesthesia.  He was able to begin ambulating the night of surgery. He remained hemodynamically stable overnight.  He had excellent urine output with appropriately minimal output from his pelvic drain and his pelvic drain was removed on POD #1.  He was transitioned to oral pain medication, tolerated a clear liquid diet, and had met all discharge criteria and was able to be discharged home later on POD#1.  Laboratory values:  Recent Labs  03/17/13 1245 03/18/13 0526 03/18/13 1020  HGB 12.3* 10.9* 11.7*  HCT 35.6* 32.5* 33.5*    Disposition: Home  Discharge instruction: He was instructed to be ambulatory but to refrain from heavy lifting, strenuous activity, or driving. He was instructed on urethral catheter care.  Discharge medications:     Medication List    STOP taking these medications       aspirin 81 MG tablet     fish oil-omega-3 fatty acids 1000 MG capsule     multivitamin with minerals tablet     naproxen 375 MG tablet  Commonly known as:  NAPROSYN      TAKE these medications       ALPRAZolam 1 MG tablet  Commonly known as:  XANAX  Take 0.25 mg by mouth 2 (two) times daily as needed for anxiety.     ciprofloxacin 500 MG tablet  Commonly known as:  CIPRO  Take 1 tablet  (500 mg total) by mouth 2 (two) times daily. Start day prior to office visit for foley removal     esomeprazole 40 MG capsule  Commonly known as:  NEXIUM  Take 40 mg by mouth daily at 12 noon.     oxyCODONE-acetaminophen 5-325 MG per tablet  Commonly known as:  ROXICET  Take 1-2 tablets by mouth every 6 (six) hours as needed for moderate pain or severe pain (make take one tablet every 4 hours or 2 every 6 hours).     Potassium 99 MG Tabs  Take 1 tablet by mouth daily.        Followup: He will followup in 1 week for catheter removal and to discuss his surgical pathology results.

## 2013-03-19 ENCOUNTER — Encounter (HOSPITAL_COMMUNITY): Payer: Self-pay | Admitting: Urology

## 2013-05-17 ENCOUNTER — Other Ambulatory Visit: Payer: Self-pay | Admitting: Family Medicine

## 2013-05-17 NOTE — Telephone Encounter (Signed)
This was discontinued by Urologist after procedure in January

## 2013-05-21 ENCOUNTER — Other Ambulatory Visit: Payer: Self-pay | Admitting: Family Medicine

## 2013-05-21 NOTE — Telephone Encounter (Signed)
OK refill??  Was discontinued by Urology after procedure in January!!

## 2013-05-21 NOTE — Telephone Encounter (Signed)
What does he take it for, its alleve.  I am okay if he needs it for joints.

## 2013-06-25 ENCOUNTER — Other Ambulatory Visit (HOSPITAL_COMMUNITY): Payer: Self-pay | Admitting: Urology

## 2013-06-25 DIAGNOSIS — C61 Malignant neoplasm of prostate: Secondary | ICD-10-CM

## 2013-07-08 ENCOUNTER — Ambulatory Visit (INDEPENDENT_AMBULATORY_CARE_PROVIDER_SITE_OTHER): Payer: Medicare Other | Admitting: Physician Assistant

## 2013-07-08 ENCOUNTER — Encounter: Payer: Self-pay | Admitting: Physician Assistant

## 2013-07-08 VITALS — BP 122/84 | HR 68 | Temp 98.3°F | Resp 18 | Ht 66.75 in | Wt 214.0 lb

## 2013-07-08 DIAGNOSIS — B9689 Other specified bacterial agents as the cause of diseases classified elsewhere: Principal | ICD-10-CM

## 2013-07-08 DIAGNOSIS — J988 Other specified respiratory disorders: Secondary | ICD-10-CM

## 2013-07-08 DIAGNOSIS — M19049 Primary osteoarthritis, unspecified hand: Secondary | ICD-10-CM

## 2013-07-08 DIAGNOSIS — A499 Bacterial infection, unspecified: Secondary | ICD-10-CM

## 2013-07-08 DIAGNOSIS — M19041 Primary osteoarthritis, right hand: Secondary | ICD-10-CM

## 2013-07-08 DIAGNOSIS — M19042 Primary osteoarthritis, left hand: Secondary | ICD-10-CM

## 2013-07-08 MED ORDER — AZITHROMYCIN 250 MG PO TABS: ORAL_TABLET | ORAL | Status: DC

## 2013-07-08 MED ORDER — NAPROXEN 375 MG PO TABS: 375.0000 mg | ORAL_TABLET | Freq: Two times a day (BID) | ORAL | Status: DC

## 2013-07-08 NOTE — Progress Notes (Signed)
Patient ID: Hunter Porter MRN: [id number], DOB: Dec 01, 1945, 68 y.o. Date of Encounter: 07/08/2013, 10:16 AM    Chief Complaint:  Chief Complaint  Patient presents with  . c/o allergy prob    watery eyes, head/chest cong, ears hurt,  refill naprosyn     HPI: 68 y.o. year old white male reports that at the beginning he was having some sore throat. Says it was really sore but has gotten some better and is more just irritated now. Also has felt some congestion in his ears. Having a lot of chest congestion but this seems some better. Has been taking guaifenesin for this. Also has been getting mucus from his nose which is thick dark mucus. No known fevers or chills.  also he needs a refill on his Naprosyn. He takes this for his arthritis pains. Is that he consistently has discomfort in his fingers and hands. Occasionally also has some problems with his knees.     Home Meds: See attached medication section for any medications that were entered at today's visit. The computer does not put those onto this list.The following list is a list of meds entered prior to today's visit.   Current Outpatient Prescriptions on File Prior to Visit  Medication Sig Dispense Refill  . ALPRAZolam (XANAX) 1 MG tablet Take 0.25 mg by mouth 2 (two) times daily as needed for anxiety.      Marland Kitchen esomeprazole (NEXIUM) 40 MG capsule Take 40 mg by mouth daily at 12 noon.      . Potassium 99 MG TABS Take 1 tablet by mouth daily.         No current facility-administered medications on file prior to visit.    Allergies:  Allergies  Allergen Reactions  . Hydrocodone-Acetaminophen Nausea And Vomiting  . Penicillins Rash      Review of Systems: See HPI for pertinent ROS. All other ROS negative.    Physical Exam: Blood pressure 122/84, pulse 68, temperature 98.3 F (36.8 C), temperature source Oral, resp. rate 18, height 5' 6.75" (1.695 m), weight 214 lb (97.07 kg)., Body mass index is 33.79 kg/(m^2). General:  Overweight white male.  Appears in no acute distress. HEENT: Normocephalic, atraumatic, eyes without discharge, sclera non-icteric, nares are without discharge. Bilateral auditory canals clear, TM's are without perforation, pearly grey and translucent with reflective cone of light bilaterally. Oral cavity moist, posterior pharynx without exudate or peritonsillar abscess. Uvula and posterior pharynx has moderate erythema.  Neck: Supple. No thyromegaly. No lymphadenopathy. Lungs: Clear bilaterally to auscultation without wheezes, rales, or rhonchi. Breathing is unlabored. Heart: Regular rhythm. No murmurs, rubs, or gallops. Msk:  Strength and tone normal for age. Extremities/Skin: Warm and dry.  No edema. No rashes . Neuro: Alert and oriented X 3. Moves all extremities spontaneously. Gait is normal. CNII-XII grossly in tact. Psych:  Responds to questions appropriately with a normal affect.     ASSESSMENT AND PLAN:  68 y.o. year old male with  1. Bacterial respiratory infection - azithromycin (ZITHROMAX) 250 MG tablet; Day 1: Take 2 daily.  Days 2-5: Take 1 daily.  Dispense: 6 tablet; Refill: 0 He can continue using over-the-counter guaifenesin and other than over-the-counter decongestants as needed for symptom relief. He is allergic to penicillin. Followup if symptoms do not resolve within 1 week of completion of antibiotic.  2. Arthritis of both hands - naproxen (NAPROSYN) 375 MG tablet; Take 1 tablet (375 mg total) by mouth 2 (two) times daily with a meal. As  needed  Dispense: 60 tablet; Refill: [address] [place], [place], [facility] 07/08/2013 10:16 AM

## 2013-07-09 ENCOUNTER — Encounter (HOSPITAL_COMMUNITY)
Admission: RE | Admit: 2013-07-09 | Discharge: 2013-07-09 | Disposition: A | Discharge status: Home / Self Care | Payer: Medicare Other | Source: Ambulatory Visit | Attending: Urology | Admitting: Urology

## 2013-07-09 ENCOUNTER — Encounter (HOSPITAL_COMMUNITY): Payer: Self-pay

## 2013-07-09 DIAGNOSIS — C61 Malignant neoplasm of prostate: Secondary | ICD-10-CM

## 2013-07-09 MED ORDER — TECHNETIUM TC 99M MEDRONATE IV KIT
25.9000 | PACK | Freq: Once | INTRAVENOUS | Status: AC | PRN
  Administered 2013-07-09: 25.9 via INTRAVENOUS

## 2013-08-12 ENCOUNTER — Other Ambulatory Visit: Payer: Self-pay | Admitting: Physician Assistant

## 2013-08-12 NOTE — Telephone Encounter (Signed)
Refill appropriate and filled per protocol. 

## 2013-09-02 ENCOUNTER — Encounter: Payer: Self-pay | Admitting: Physician Assistant

## 2013-09-02 ENCOUNTER — Ambulatory Visit (INDEPENDENT_AMBULATORY_CARE_PROVIDER_SITE_OTHER): Payer: Medicare Other | Admitting: Physician Assistant

## 2013-09-02 VITALS — BP 138/76 | HR 72 | Temp 98.1°F | Resp 14 | Ht 67.0 in | Wt 213.0 lb

## 2013-09-02 DIAGNOSIS — T148XXA Other injury of unspecified body region, initial encounter: Secondary | ICD-10-CM

## 2013-09-02 NOTE — Progress Notes (Signed)
Patient ID: Hunter Porter MRN: [id number], DOB: February 10, 1946, 68 y.o. Date of Encounter: 09/02/2013, 12:16 PM    Chief Complaint:  Chief Complaint  Patient presents with  . Pulled Muscle    x 1 week- R side, in between hip and ribs- was stung by bee and he "yanked" and thinks he pulled muscle     HPI: 68 y.o. year old white male is that he has had a prior history of low back pain and that I prescribed Flexeril for this in the past. Says he still has Flexeril at home. Says last Tuesday he played golf. Says that he felt some discomfort in his right low back for the last 6 holes  but continued to play to finish the course.  He says the next day he had area was still sore. Then he got a bee sting to the left ear. When the bee stung him, it caused him to jerk and yank away. When he did this, it caused a muscle spasm in the right low back and around his right low flank. Says that initially it was cramped up so bad that it takes up him 40 minutes just to walk to get into the house. Says after that it began to ease off. As well, he has been using the Flexeril on a routine basis which is helping. Says it has eased off a lot and is not bothering him much at all. Says that he was able to sleep well for the last 2 nights and not needing to get up to take any medicine during the night.  Says he thought he should just come in and have it checked.     Home Meds:   Outpatient Prescriptions Prior to Visit  Medication Sig Dispense Refill  . ALPRAZolam (XANAX) 1 MG tablet Take 0.25 mg by mouth 2 (two) times daily as needed for anxiety.      . naproxen (NAPROSYN) 375 MG tablet Take 1 tablet (375 mg total) by mouth 2 (two) times daily with a meal. As needed  60 tablet  11  . NEXIUM 40 MG capsule TAKE ONE CAPSULE DAILY.  30 capsule  6  . Potassium 99 MG TABS Take 1 tablet by mouth daily.        Marland Kitchen azithromycin (ZITHROMAX) 250 MG tablet Day 1: Take 2 daily.  Days 2-5: Take 1 daily.  6 tablet  0   No  facility-administered medications prior to visit.    Allergies:  Allergies  Allergen Reactions  . Hydrocodone-Acetaminophen Nausea And Vomiting  . Penicillins Rash      Review of Systems: See HPI for pertinent ROS. All other ROS negative.    Physical Exam: Blood pressure 138/76, pulse 72, temperature 98.1 F (36.7 C), temperature source Oral, resp. rate 14, height 5\' 7"  (1.702 m), weight 213 lb (96.616 kg)., Body mass index is 33.35 kg/(m^2). General: WM with Abdominal Obesity.  Appears in no acute distress. Lungs: Clear bilaterally to auscultation without wheezes, rales, or rhonchi. Breathing is unlabored. Heart: Regular rhythm. No murmurs, rubs, or gallops. Msk:  Strength and tone normal for age. He points to the right low back at about L3 level as the area of discomfort. He then moves his finger around the right flank and down towards the right lower abdomen following the T11 nerve root area. Says that this has been area of discomfort.  There is minimal discomfort reproduced with palpation of that area of the right low back. There is  no rash present at that site. Extremities/Skin: Warm and dry.No rashes or suspicious lesions. Neuro: Alert and oriented X 3. Moves all extremities spontaneously. Gait is normal. CNII-XII grossly in tact. Psych:  Responds to questions appropriately with a normal affect.     ASSESSMENT AND PLAN:  68 y.o. year old male with  68. Muscle strain He says that he is doing some stretches. Encouraged him to continue to do stretches including lateral stretches to stretch the right side. Continue to use Flexeril as needed. also can apply heat as needed. No further treatment at this time. Followup as needed.    [address] [place], [place], [facility] 09/02/2013 12:16 PM

## 2013-09-24 ENCOUNTER — Encounter: Payer: Self-pay | Admitting: Radiation Oncology

## 2013-09-24 NOTE — Progress Notes (Signed)
GU Location of Tumor / Histology: Adenocarcinoma of prostate  If Prostate Cancer, Gleason Score is (4 + 5) and PSA is (0.53)  Hunter Porter incidentally noted in 2012 to have an slightly elevated PSA with minimal nocturia but, no other complaints.   Biopsies of prostate (if applicable) revealed:    Past/Anticipated interventions by urology, if any: prostatectomy  Past/Anticipated interventions by medical oncology, if any: no  Weight changes, if any: no  Bowel/Bladder complaints, if any: yes, mild nocturia   Nausea/Vomiting, if any: no  Pain issues, if any:  no  SAFETY ISSUES:  Prior radiation? no  Pacemaker/ICD? no  Possible current pregnancy? no  Is the patient on methotrexate? no  Current Complaints / other details:  68 year old male. Denies incontinence and reports an excellent urinary stream following prostatectomy. Remains active playing golf. Concern for detectable PSA and extra tissue extending from the apex periurethral tissue positive for gleason 8 cancer.

## 2013-09-26 ENCOUNTER — Encounter: Payer: Self-pay | Admitting: Radiation Oncology

## 2013-09-26 NOTE — Progress Notes (Signed)
Radiation Oncology         [phone]) [phone] ________________________________  Initial outpatient Consultation  Name: Hunter Porter MRN: [id number]  Date: 09/27/2013  DOB: Jul 09, 1945  GU:YQIHK,VQQV BETH, PA-C  [name], MD   REFERRING PHYSICIAN: [name], MD  DIAGNOSIS: 68 y.o. gentleman with stage pT3a adenocarcinoma of the prostate with a Gleason's score of 4+5 and a rising post-operative PSA of 0.53  HISTORY OF PRESENT ILLNESS::Hunter Porter is a 68 y.o. gentleman.  He was noted to have an elevated PSA of 4.32 by his primary care PA, [name].  Accordingly, he was referred for evaluation in urology by Dr. Risa Grill on 10/23/12,  digital rectal examination was performed at that time revealing a 1+ gland with induration of the right lobe.  The patient proceeded to transrectal ultrasound with 12 biopsies of the prostate on 12/01/12.  The prostate volume measured 31 cc.  Out of 12 core biopsies, 7 were positive.  The maximum Gleason score was 4+3, and this was seen in the distribution pictured in the figure below:    The patient reviewed the biopsy results with his urologist and elected to proceed with prostatectomy on 03/17/13.  Pathology showed Gleason's 4+5 disease with focal extracapsular extension.  Prostate margins were negative, seminal vesicles were negative, and 13 nodes from the right and left were negative.  A biopsy of the right urethral margin was however positive for Gleason's 4+4 adenocarcinoma.  Post-operatively, his PSA has remained detectable and shows a slowly rising trajectory.    04/30/13 - 0.39 06/16/13 - 0.45 08/12/13 - 0.53  Staging CT of abd/pelvis along with bone scan were performed on 07/09/13.  There was a low density mass in the left pelvic iliac chain suggestive of possible adenopathy.  The patient reviewed the post-op PSA results with his urologist and he has kindly been referred today for discussion of potential radiation treatment  options.  PREVIOUS RADIATION THERAPY: Yes, posterior orbits treated for Grave's ophthalmopathy 18 years ago.  PAST MEDICAL HISTORY:  has a past medical history of Hyperlipidemia; Substance abuse; Ulcer (approx 1970); Adenomatous colon polyp; GERD (gastroesophageal reflux disease); Anxiety; Thyroid disease; Prostate cancer (01/20/2013); Arthritis; H/O hiatal hernia; H/O hemorrhoids; Heart murmur (2007); and Sleep apnea (2007).    PAST SURGICAL HISTORY: Past Surgical History  Procedure Laterality Date  . Cataract extraction w/ intraocular lens  implant, bilateral    . Upper gastrointestinal endoscopy    . Colonoscopy    . Tonsillectomy    . Transurethral resection of prostate  12/01/2012    Dr Risa Grill  . Robot assisted laparoscopic radical prostatectomy N/A 03/17/2013    Procedure: ROBOTIC ASSISTED LAPAROSCOPIC RADICAL PROSTATECTOMY;  Surgeon: [name], MD;  Location: [facility];  Service: Urology;  Laterality: N/A;  . Lymphadenectomy Bilateral 03/17/2013    Procedure: LYMPHADENECTOMY PELVIC LYMPH NODE DISSECTION;  Surgeon: [name], MD;  Location: [facility];  Service: Urology;  Laterality: Bilateral;  . Prostate biopsy      FAMILY HISTORY: family history includes Cancer in his mother.  SOCIAL HISTORY:  reports that he has been smoking Cigarettes.  He has been smoking about 0.80 packs per day. He has never used smokeless tobacco. He reports that he drinks about 14.4 ounces of alcohol per week. He reports that he does not use illicit drugs.  ALLERGIES: Hydrocodone-acetaminophen and Penicillins  MEDICATIONS:  Current Outpatient Prescriptions  Medication Sig Dispense Refill  . aspirin 81 MG tablet Take 81 mg by mouth daily.      Marland Kitchen  calcium carbonate 1250 MG capsule Take 1,250 mg by mouth daily.      . cyclobenzaprine (FLEXERIL) 10 MG tablet       . magnesium 30 MG tablet Take 30 mg by mouth daily.      . Multiple Vitamin (MULTIVITAMIN) capsule Take 1 capsule by mouth daily.      .  naproxen (NAPROSYN) 375 MG tablet Take 1 tablet (375 mg total) by mouth 2 (two) times daily with a meal. As needed  60 tablet  11  . NEXIUM 40 MG capsule TAKE ONE CAPSULE DAILY.  30 capsule  6  . Omega-3 Fatty Acids (FISH OIL) 1000 MG CAPS Take by mouth.      . Potassium 99 MG TABS Take 1 tablet by mouth daily.        Marland Kitchen zinc gluconate 50 MG tablet Take 50 mg by mouth daily.      Marland Kitchen ALPRAZolam (XANAX) 1 MG tablet Take 0.25 mg by mouth 2 (two) times daily as needed for anxiety.       No current facility-administered medications for this encounter.    REVIEW OF SYSTEMS:  A 15 point review of systems is documented in the electronic medical record. This was obtained by the nursing staff. However, I reviewed this with the patient to discuss relevant findings and make appropriate changes.  A comprehensive review of systems was negative..  The patient completed an IPSS and IIEF questionnaire.  His IPSS score was 2 indicating mild urinary outflow obstructive symptoms, and has regained full urinary continence.  He indicated that his erectile function is not an issue that is important to him.   PHYSICAL EXAM: This patient is in no acute distress.  He is alert and oriented.   height is 5\' 8"  (1.727 m) and weight is 213 lb (96.616 kg). His oral temperature is 98 F (36.7 C). His blood pressure is 133/71 and his pulse is 69. His respiration is 16 and oxygen saturation is 100%.  He exhibits no respiratory distress or labored breathing.  He appears neurologically intact.  His mood is pleasant.  His affect is appropriate.  Please note the digital rectal exam findings described above.  KPS = 100  100 - Normal; no complaints; no evidence of disease. 90   - Able to carry on normal activity; minor signs or symptoms of disease. 80   - Normal activity with effort; some signs or symptoms of disease. 48   - Cares for self; unable to carry on normal activity or to do active work. 60   - Requires occasional assistance, but  is able to care for most of his personal needs. 50   - Requires considerable assistance and frequent medical care. 43   - Disabled; requires special care and assistance. 15   - Severely disabled; hospital admission is indicated although death not imminent. 40   - Very sick; hospital admission necessary; active supportive treatment necessary. 10   - Moribund; fatal processes progressing rapidly. 0     - Dead  Karnofsky DA, Abelmann [place], Craver LS and Burchenal [facility] [phone]) The use of the nitrogen mustards in the palliative treatment of carcinoma: with particular reference to bronchogenic carcinoma Cancer 1 634-56   LABORATORY DATA:  Lab Results  Component Value Date   WBC 9.0 03/15/2013   HGB 11.7* 03/18/2013   HCT 33.5* 03/18/2013   MCV 93.7 03/15/2013   PLT 211 03/15/2013   Lab Results  Component Value Date   NA 137  03/18/2013   K 3.2* 03/18/2013   CL 101 03/18/2013   CO2 25 03/18/2013   No results found for this basename: ALT,  AST,  GGT,  ALKPHOS,  BILITOT     RADIOGRAPHY: No results found.    IMPRESSION: This gentleman is a  67 y.o. gentleman with stage pT3a adenocarcinoma of the prostate with a Gleason's score of 4+5 and a rising post-operative PSA of 0.53.  His extraprostatic extension, positive urethral margin sample and rising PSA suggest a possible local recurrence with biochemical failure.  The patient may benefit from salvage radiotherapy to the pelvic nodes and prostatic fossa.  PLAN: Today, I talked to the patient and family about the findings and work-up thus far.  We discussed the natural history of biochemically recurrent prostate cancer following prostatectomy and general treatment, highlighting the role or radiotherapy in the management.  We discussed the available radiation techniques, and focused on the details of logistics and delivery.  We reviewed the anticipated acute and late sequelae associated with radiation in this setting.  The patient was encouraged to ask questions  that I answered to the best of my ability.  I filled out a patient counseling form during our discussion including treatment diagrams.  We retained a copy for our records.  The patient would like to proceed with radiation.  I suspect the addition of androgen deprivation to radiotherapy may be beneficial in this high risk patient.  We will work to have him return to see Dr. Risa Grill to initiate androgen deprivation with a plan for pelvic/prostatic fossa radiotherapy after 2 months of androgen deprivation.  I spent 60 minutes minutes face to face with the patient and more than 50% of that time was spent in counseling and/or coordination of care.    ------------------------------------------------  Sheral Apley. [name], M.D.

## 2013-09-27 ENCOUNTER — Encounter: Payer: Self-pay | Admitting: Radiation Oncology

## 2013-09-27 ENCOUNTER — Ambulatory Visit
Admission: RE | Admit: 2013-09-27 | Discharge: 2013-09-27 | Disposition: A | Discharge status: Home / Self Care | Payer: Medicare Other | Source: Ambulatory Visit | Attending: Radiation Oncology | Admitting: Radiation Oncology

## 2013-09-27 VITALS — BP 133/71 | HR 69 | Temp 98.0°F | Resp 16 | Ht 68.0 in | Wt 213.0 lb

## 2013-09-27 DIAGNOSIS — Z79899 Other long term (current) drug therapy: Secondary | ICD-10-CM | POA: Diagnosis not present

## 2013-09-27 DIAGNOSIS — K219 Gastro-esophageal reflux disease without esophagitis: Secondary | ICD-10-CM | POA: Diagnosis not present

## 2013-09-27 DIAGNOSIS — Z7982 Long term (current) use of aspirin: Secondary | ICD-10-CM | POA: Diagnosis not present

## 2013-09-27 DIAGNOSIS — C61 Malignant neoplasm of prostate: Secondary | ICD-10-CM | POA: Diagnosis not present

## 2013-09-27 DIAGNOSIS — F411 Generalized anxiety disorder: Secondary | ICD-10-CM | POA: Diagnosis not present

## 2013-09-27 DIAGNOSIS — Z9079 Acquired absence of other genital organ(s): Secondary | ICD-10-CM | POA: Insufficient documentation

## 2013-09-27 DIAGNOSIS — F17200 Nicotine dependence, unspecified, uncomplicated: Secondary | ICD-10-CM | POA: Diagnosis not present

## 2013-09-27 DIAGNOSIS — Z510 Encounter for antineoplastic radiation therapy: Secondary | ICD-10-CM | POA: Diagnosis present

## 2013-09-27 NOTE — Progress Notes (Signed)
See progress note under physician encounter. 

## 2013-09-27 NOTE — Progress Notes (Signed)
Patient remain active playing golf and working in the yard. Reports chronic night sweat related to Graves disease/thyroid. Reports nocturia x1. Reports urgency. Denies hematuria or dysuria. Denies diarrhea, blood in stool or pain associated with bowel movements. Denies weight loss. Reports he WAS TREATED WITH RADIATION THERAPY 18 YEARS AGO AT [facility] for Graves disease. Denies headache, dizziness, nausea, or vomiting. Reports a steady stream of urine when voiding.

## 2013-09-29 ENCOUNTER — Telehealth: Payer: Self-pay | Admitting: *Deleted

## 2013-09-29 NOTE — Telephone Encounter (Signed)
CALLED PATIENT TO INFORM OF SIM APPT. FOR 11-18-13 @ 9 AM, PATIENT'S HORMONE THERAPY IS SCHEDULED FOR 09-30-13 @ DR. GRAPEY'S OFFICE, LVM FOR A RETURN CALL

## 2013-09-29 NOTE — Telephone Encounter (Signed)
patint returned call to Romie Jumper, knows of Dr.Grapey's appt 09/30/13, asked if shirley was available, informed him she was out of office for 1 hour, to cll him on his cell [phone] when she gets back, may ned to change 11/18/13 schedule time ct [facility]

## 2013-11-02 ENCOUNTER — Telehealth: Payer: Self-pay | Admitting: Physician Assistant

## 2013-11-02 MED ORDER — EPINEPHRINE 0.3 MG/0.3ML IJ SOAJ: 0.3000 mg | Freq: Once | INTRAMUSCULAR | Status: AC

## 2013-11-02 NOTE — Telephone Encounter (Signed)
MD made aware of request and approved.   Prescription sent to pharmacy.

## 2013-11-02 NOTE — Telephone Encounter (Signed)
[facility] [address] Patient needs rx for epi pen if possible  He is going out of town tomorrow  [phone]

## 2013-11-17 NOTE — Progress Notes (Signed)
  Radiation Oncology         [phone] ________________________________  Name: Hunter Porter MRN: [id number]  Date: 11/18/2013  DOB: Jan 28, 1946  SIMULATION AND TREATMENT PLANNING NOTE  DIAGNOSIS:  68 y.o. gentleman with stage pT3a adenocarcinoma of the prostate with a Gleason's score of 4+5 and a rising post-operative PSA of 0.53  NARRATIVE:  The patient was brought to the [facility].  Identity was confirmed.  All relevant records and images related to the planned course of therapy were reviewed.  The patient freely provided informed written consent to proceed with treatment after reviewing the details related to the planned course of therapy. The consent form was witnessed and verified by the simulation staff.  Then, the patient was set-up in a stable reproducible supine position for radiation therapy.  A vacuum lock pillow device was custom fabricated to position his legs in a reproducible immobilized position.  Then, I performed a urethrogram under sterile conditions to identify the prostatic apex.  CT images were obtained.  Surface markings were placed.  The CT images were loaded into the planning software.  Then the prostate target and avoidance structures including the rectum, bladder, bowel and hips were contoured.  Treatment planning then occurred.  The radiation prescription was entered and confirmed.  A total of one complex treatment device was fabricated. I have requested : Intensity Modulated Radiotherapy (IMRT) is medically necessary for this case for the following reason:  Rectal sparing.Marland Kitchen  PLAN:  The patient will receive 68.4 Gy in 38 fractions.  ________________________________  Sheral Apley [name], M.D.

## 2013-11-18 ENCOUNTER — Ambulatory Visit
Admission: RE | Admit: 2013-11-18 | Discharge: 2013-11-18 | Disposition: A | Discharge status: Home / Self Care | Payer: Medicare Other | Source: Ambulatory Visit | Attending: Radiation Oncology | Admitting: Radiation Oncology

## 2013-11-18 DIAGNOSIS — Z510 Encounter for antineoplastic radiation therapy: Secondary | ICD-10-CM | POA: Diagnosis not present

## 2013-11-18 DIAGNOSIS — C61 Malignant neoplasm of prostate: Secondary | ICD-10-CM

## 2013-11-26 DIAGNOSIS — Z7982 Long term (current) use of aspirin: Secondary | ICD-10-CM | POA: Diagnosis not present

## 2013-11-26 DIAGNOSIS — C61 Malignant neoplasm of prostate: Secondary | ICD-10-CM | POA: Insufficient documentation

## 2013-11-26 DIAGNOSIS — Z79899 Other long term (current) drug therapy: Secondary | ICD-10-CM | POA: Diagnosis not present

## 2013-11-26 DIAGNOSIS — Z510 Encounter for antineoplastic radiation therapy: Secondary | ICD-10-CM | POA: Diagnosis not present

## 2013-11-29 ENCOUNTER — Ambulatory Visit
Admission: RE | Admit: 2013-11-29 | Discharge: 2013-11-29 | Disposition: A | Discharge status: Home / Self Care | Payer: Medicare Other | Source: Ambulatory Visit | Attending: Radiation Oncology | Admitting: Radiation Oncology

## 2013-11-29 DIAGNOSIS — Z510 Encounter for antineoplastic radiation therapy: Secondary | ICD-10-CM | POA: Diagnosis not present

## 2013-11-30 ENCOUNTER — Ambulatory Visit
Admission: RE | Admit: 2013-11-30 | Discharge: 2013-11-30 | Disposition: A | Discharge status: Home / Self Care | Payer: Medicare Other | Source: Ambulatory Visit | Attending: Radiation Oncology | Admitting: Radiation Oncology

## 2013-11-30 DIAGNOSIS — Z510 Encounter for antineoplastic radiation therapy: Secondary | ICD-10-CM | POA: Diagnosis not present

## 2013-12-01 ENCOUNTER — Ambulatory Visit
Admission: RE | Admit: 2013-12-01 | Discharge: 2013-12-01 | Disposition: A | Discharge status: Home / Self Care | Payer: Medicare Other | Source: Ambulatory Visit | Attending: Radiation Oncology | Admitting: Radiation Oncology

## 2013-12-01 DIAGNOSIS — Z510 Encounter for antineoplastic radiation therapy: Secondary | ICD-10-CM | POA: Diagnosis not present

## 2013-12-02 ENCOUNTER — Ambulatory Visit
Admission: RE | Admit: 2013-12-02 | Discharge: 2013-12-02 | Disposition: A | Discharge status: Home / Self Care | Payer: Medicare Other | Source: Ambulatory Visit | Attending: Radiation Oncology | Admitting: Radiation Oncology

## 2013-12-02 DIAGNOSIS — Z510 Encounter for antineoplastic radiation therapy: Secondary | ICD-10-CM | POA: Diagnosis not present

## 2013-12-03 ENCOUNTER — Encounter: Payer: Self-pay | Admitting: Radiation Oncology

## 2013-12-03 ENCOUNTER — Ambulatory Visit
Admission: RE | Admit: 2013-12-03 | Discharge: 2013-12-03 | Disposition: A | Discharge status: Home / Self Care | Payer: Medicare Other | Source: Ambulatory Visit | Attending: Radiation Oncology | Admitting: Radiation Oncology

## 2013-12-03 VITALS — BP 133/75 | HR 73 | Resp 16 | Wt 213.5 lb

## 2013-12-03 DIAGNOSIS — Z510 Encounter for antineoplastic radiation therapy: Secondary | ICD-10-CM | POA: Diagnosis not present

## 2013-12-03 DIAGNOSIS — C61 Malignant neoplasm of prostate: Secondary | ICD-10-CM

## 2013-12-03 NOTE — Progress Notes (Signed)
Weight and vitals stable. Reports nocturia x1. Reports urgency. Denies hematuria or dysuria. Denies diarrhea, blood in stool or pain associated with bowel movements.

## 2013-12-03 NOTE — Progress Notes (Signed)
  Radiation Oncology         [phone] ________________________________   Name: Hunter Porter MRN: [id number]  Date: 12/03/2013  DOB: 1946-02-23  Weekly Radiation Therapy Management    ICD-9-CM ICD-10-CM  1. Prostate cancer 185 C61    Current Dose: 9 Gy     Planned Dose:  60.4 Gy  Narrative . . . . . . . . The patient presents for routine under treatment assessment.                                   The patient is without complaint.                                 Set-up films were reviewed.                                 The chart was checked. Physical Findings. . .  vitals were not taken for this visit.. Weight essentially stable.  No significant changes. Impression . . . . . . . The patient is tolerating radiation. Plan . . . . . . . . . . . . Continue treatment as planned.  ________________________________  Sheral Apley. [name], M.D.

## 2013-12-06 ENCOUNTER — Ambulatory Visit
Admission: RE | Admit: 2013-12-06 | Discharge: 2013-12-06 | Disposition: A | Discharge status: Home / Self Care | Payer: Medicare Other | Source: Ambulatory Visit | Attending: Radiation Oncology | Admitting: Radiation Oncology

## 2013-12-06 DIAGNOSIS — Z510 Encounter for antineoplastic radiation therapy: Secondary | ICD-10-CM | POA: Diagnosis not present

## 2013-12-07 ENCOUNTER — Ambulatory Visit
Admission: RE | Admit: 2013-12-07 | Discharge: 2013-12-07 | Disposition: A | Discharge status: Home / Self Care | Payer: Medicare Other | Source: Ambulatory Visit | Attending: Radiation Oncology | Admitting: Radiation Oncology

## 2013-12-07 DIAGNOSIS — Z510 Encounter for antineoplastic radiation therapy: Secondary | ICD-10-CM | POA: Diagnosis not present

## 2013-12-08 ENCOUNTER — Ambulatory Visit
Admission: RE | Admit: 2013-12-08 | Discharge: 2013-12-08 | Disposition: A | Discharge status: Home / Self Care | Payer: Medicare Other | Source: Ambulatory Visit | Attending: Radiation Oncology | Admitting: Radiation Oncology

## 2013-12-08 DIAGNOSIS — Z510 Encounter for antineoplastic radiation therapy: Secondary | ICD-10-CM | POA: Diagnosis not present

## 2013-12-09 ENCOUNTER — Ambulatory Visit
Admission: RE | Admit: 2013-12-09 | Discharge: 2013-12-09 | Disposition: A | Discharge status: Home / Self Care | Payer: Medicare Other | Source: Ambulatory Visit | Attending: Radiation Oncology | Admitting: Radiation Oncology

## 2013-12-09 DIAGNOSIS — Z510 Encounter for antineoplastic radiation therapy: Secondary | ICD-10-CM | POA: Diagnosis not present

## 2013-12-10 ENCOUNTER — Ambulatory Visit
Admission: RE | Admit: 2013-12-10 | Discharge: 2013-12-10 | Disposition: A | Discharge status: Home / Self Care | Payer: Medicare Other | Source: Ambulatory Visit | Attending: Radiation Oncology | Admitting: Radiation Oncology

## 2013-12-10 ENCOUNTER — Encounter: Payer: Self-pay | Admitting: Radiation Oncology

## 2013-12-10 VITALS — BP 141/74 | HR 74 | Resp 18 | Wt 216.1 lb

## 2013-12-10 DIAGNOSIS — C61 Malignant neoplasm of prostate: Secondary | ICD-10-CM

## 2013-12-10 DIAGNOSIS — Z510 Encounter for antineoplastic radiation therapy: Secondary | ICD-10-CM | POA: Diagnosis not present

## 2013-12-10 NOTE — Progress Notes (Signed)
Vitals and weight are stable. Reports nocturia x1. Reports urgency. Denies hematuria or dysuria. Denies pain. Denies diarrhea.

## 2013-12-11 ENCOUNTER — Encounter: Payer: Self-pay | Admitting: Radiation Oncology

## 2013-12-11 NOTE — Progress Notes (Signed)
  Radiation Oncology         [phone] ________________________________    Name: Hunter Porter MRN: [id number]  Date: 12/10/2013  DOB: 31-May-1945  Weekly Radiation Therapy Management    ICD-9-CM ICD-10-CM  1. Prostate cancer 185 C61    Current Dose: 18 Gy     Planned Dose:  60.4 Gy  Narrative . . . . . . . . The patient presents for routine under treatment assessment.                                 Vitals and weight are stable. Reports nocturia x1. Reports urgency. Denies hematuria or dysuria. Denies pain. Denies diarrhea.                                 Set-up films were reviewed.                                 The chart was checked. Physical Findings. . .  weight is 216 lb 1.6 oz (98.022 kg). His blood pressure is 141/74 and his pulse is 74. His respiration is 18. . Weight essentially stable.  No significant changes. Impression . . . . . . . The patient is tolerating radiation. Plan . . . . . . . . . . . . Continue treatment as planned.  ________________________________  Sheral Apley. [name], M.D.

## 2013-12-13 ENCOUNTER — Ambulatory Visit
Admission: RE | Admit: 2013-12-13 | Discharge: 2013-12-13 | Disposition: A | Discharge status: Home / Self Care | Payer: Medicare Other | Source: Ambulatory Visit | Attending: Radiation Oncology | Admitting: Radiation Oncology

## 2013-12-13 DIAGNOSIS — Z510 Encounter for antineoplastic radiation therapy: Secondary | ICD-10-CM | POA: Diagnosis not present

## 2013-12-14 ENCOUNTER — Ambulatory Visit
Admission: RE | Admit: 2013-12-14 | Discharge: 2013-12-14 | Disposition: A | Discharge status: Home / Self Care | Payer: Medicare Other | Source: Ambulatory Visit | Attending: Radiation Oncology | Admitting: Radiation Oncology

## 2013-12-14 DIAGNOSIS — Z510 Encounter for antineoplastic radiation therapy: Secondary | ICD-10-CM | POA: Diagnosis not present

## 2013-12-15 ENCOUNTER — Ambulatory Visit
Admission: RE | Admit: 2013-12-15 | Discharge: 2013-12-15 | Disposition: A | Discharge status: Home / Self Care | Payer: Medicare Other | Source: Ambulatory Visit | Attending: Radiation Oncology | Admitting: Radiation Oncology

## 2013-12-15 DIAGNOSIS — Z510 Encounter for antineoplastic radiation therapy: Secondary | ICD-10-CM | POA: Diagnosis not present

## 2013-12-16 ENCOUNTER — Ambulatory Visit
Admission: RE | Admit: 2013-12-16 | Discharge: 2013-12-16 | Disposition: A | Discharge status: Home / Self Care | Payer: Medicare Other | Source: Ambulatory Visit | Attending: Radiation Oncology | Admitting: Radiation Oncology

## 2013-12-16 DIAGNOSIS — Z510 Encounter for antineoplastic radiation therapy: Secondary | ICD-10-CM | POA: Diagnosis not present

## 2013-12-17 ENCOUNTER — Encounter: Payer: Self-pay | Admitting: Radiation Oncology

## 2013-12-17 ENCOUNTER — Ambulatory Visit
Admission: RE | Admit: 2013-12-17 | Discharge: 2013-12-17 | Disposition: A | Discharge status: Home / Self Care | Payer: Medicare Other | Source: Ambulatory Visit | Attending: Radiation Oncology | Admitting: Radiation Oncology

## 2013-12-17 VITALS — BP 140/79 | HR 75 | Resp 16 | Wt 215.0 lb

## 2013-12-17 DIAGNOSIS — C61 Malignant neoplasm of prostate: Secondary | ICD-10-CM

## 2013-12-17 DIAGNOSIS — Z510 Encounter for antineoplastic radiation therapy: Secondary | ICD-10-CM | POA: Diagnosis not present

## 2013-12-17 NOTE — Progress Notes (Signed)
  Radiation Oncology         [phone] ________________________________    Name: Hunter Porter MRN: [id number]  Date: 12/17/2013  DOB: Mar 20, 1945  Weekly Radiation Therapy Management    ICD-9-CM ICD-10-CM  1. Prostate cancer 185 C61    Current Dose: 27 Gy     Planned Dose:  68.4 Gy  Narrative . . . . . . . . The patient presents for routine under treatment assessment.                                 Weight and vitals stable. Denies pain. Reports nocturia x1. Denies fatigue. Denies hematuria, dysuria, or diarrhea.                                  Set-up films were reviewed.                                 The chart was checked. Physical Findings. . .  weight is 215 lb (97.523 kg). His blood pressure is 140/79 and his pulse is 75. His respiration is 16. . Weight essentially stable.  No significant changes. Impression . . . . . . . The patient is tolerating radiation. Plan . . . . . . . . . . . . Continue treatment as planned.  ________________________________  Sheral Apley. [name], M.D.

## 2013-12-17 NOTE — Progress Notes (Signed)
Weight and vitals stable. Denies pain. Reports nocturia x1. Denies fatigue. Denies hematuria, dysuria, or diarrhea.

## 2013-12-20 ENCOUNTER — Ambulatory Visit
Admission: RE | Admit: 2013-12-20 | Discharge: 2013-12-20 | Disposition: A | Discharge status: Home / Self Care | Payer: Medicare Other | Source: Ambulatory Visit | Attending: Radiation Oncology | Admitting: Radiation Oncology

## 2013-12-20 DIAGNOSIS — Z510 Encounter for antineoplastic radiation therapy: Secondary | ICD-10-CM | POA: Diagnosis not present

## 2013-12-21 ENCOUNTER — Ambulatory Visit
Admission: RE | Admit: 2013-12-21 | Discharge: 2013-12-21 | Disposition: A | Discharge status: Home / Self Care | Payer: Medicare Other | Source: Ambulatory Visit | Attending: Radiation Oncology | Admitting: Radiation Oncology

## 2013-12-21 DIAGNOSIS — Z510 Encounter for antineoplastic radiation therapy: Secondary | ICD-10-CM | POA: Diagnosis not present

## 2013-12-22 ENCOUNTER — Ambulatory Visit
Admission: RE | Admit: 2013-12-22 | Discharge: 2013-12-22 | Disposition: A | Discharge status: Home / Self Care | Payer: Medicare Other | Source: Ambulatory Visit | Attending: Radiation Oncology | Admitting: Radiation Oncology

## 2013-12-22 DIAGNOSIS — Z510 Encounter for antineoplastic radiation therapy: Secondary | ICD-10-CM | POA: Diagnosis not present

## 2013-12-23 ENCOUNTER — Ambulatory Visit
Admission: RE | Admit: 2013-12-23 | Discharge: 2013-12-23 | Disposition: A | Discharge status: Home / Self Care | Payer: Medicare Other | Source: Ambulatory Visit | Attending: Radiation Oncology | Admitting: Radiation Oncology

## 2013-12-23 DIAGNOSIS — Z510 Encounter for antineoplastic radiation therapy: Secondary | ICD-10-CM | POA: Diagnosis not present

## 2013-12-24 ENCOUNTER — Ambulatory Visit: Payer: Medicare Other

## 2013-12-27 ENCOUNTER — Inpatient Hospital Stay
Admission: RE | Admit: 2013-12-27 | Discharge: 2013-12-27 | Disposition: A | Discharge status: Home / Self Care | Payer: Medicare Other | Source: Ambulatory Visit | Attending: Radiation Oncology | Admitting: Radiation Oncology

## 2013-12-27 ENCOUNTER — Encounter: Payer: Self-pay | Admitting: Radiation Oncology

## 2013-12-27 ENCOUNTER — Ambulatory Visit
Admission: RE | Admit: 2013-12-27 | Discharge: 2013-12-27 | Disposition: A | Discharge status: Home / Self Care | Payer: Medicare Other | Source: Ambulatory Visit | Attending: Radiation Oncology | Admitting: Radiation Oncology

## 2013-12-27 VITALS — BP 129/71 | HR 70 | Temp 98.2°F | Resp 16 | Ht 68.0 in

## 2013-12-27 DIAGNOSIS — Z510 Encounter for antineoplastic radiation therapy: Secondary | ICD-10-CM | POA: Diagnosis not present

## 2013-12-27 DIAGNOSIS — C61 Malignant neoplasm of prostate: Secondary | ICD-10-CM

## 2013-12-27 NOTE — Progress Notes (Signed)
   Department of Radiation Oncology  Phone:  [phone] Fax:        [phone]  Weekly Treatment Note    Name: [name] Date: 12/27/2013 MRN: [id number] DOB: Sep 11, 1945   Current dose: 36 Gy  Current fraction:20   MEDICATIONS: Current Outpatient Prescriptions  Medication Sig Dispense Refill  . ALPRAZolam (XANAX) 1 MG tablet Take 0.25 mg by mouth 2 (two) times daily as needed for anxiety.    Marland Kitchen aspirin 81 MG tablet Take 81 mg by mouth daily.    . calcium carbonate 1250 MG capsule Take 1,250 mg by mouth daily.    . cyclobenzaprine (FLEXERIL) 10 MG tablet     . EPINEPHrine 0.3 mg/0.3 mL IJ SOAJ injection Inject 0.3 mLs (0.3 mg total) into the muscle once. For allergic reaction to bee stings 1 Device 3  . magnesium 30 MG tablet Take 30 mg by mouth daily.    . naproxen (NAPROSYN) 375 MG tablet Take 1 tablet (375 mg total) by mouth 2 (two) times daily with a meal. As needed 60 tablet 11  . NEXIUM 40 MG capsule TAKE ONE CAPSULE DAILY. 30 capsule 6  . Omega-3 Fatty Acids (FISH OIL) 1000 MG CAPS Take by mouth.    . Potassium 99 MG TABS Take 1 tablet by mouth daily.      Marland Kitchen zinc gluconate 50 MG tablet Take 50 mg by mouth daily.    . Multiple Vitamin (MULTIVITAMIN) capsule Take 1 capsule by mouth daily.     No current facility-administered medications for this encounter.     ALLERGIES: Hydrocodone-acetaminophen; Bee venom; and Penicillins   LABORATORY DATA:  Lab Results  Component Value Date   WBC 9.0 03/15/2013   HGB 11.7* 03/18/2013   HCT 33.5* 03/18/2013   MCV 93.7 03/15/2013   PLT 211 03/15/2013   Lab Results  Component Value Date   NA 137 03/18/2013   K 3.2* 03/18/2013   CL 101 03/18/2013   CO2 25 03/18/2013   No results found for: ALT, AST, GGT, ALKPHOS, BILITOT   NARRATIVE: Hunter Porter was seen today for weekly treatment management. The chart was checked and the patient's films were reviewed.  Hunter Porter has completed 20 fractions to his  prostate.  He reports an increase in urinary frquency at night and is getting up 2 times per night.  He denies dysuria, hematuria, diarrhea and skin irritation.  He reports that he is getting tired easier.  PHYSICAL EXAMINATION: height is 5\' 8"  (1.727 m). His oral temperature is 98.2 F (36.8 C). His blood pressure is 129/71 and his pulse is 70. His respiration is 16.        ASSESSMENT: The patient is doing satisfactorily with treatment.  PLAN: We will continue with the patient's radiation treatment as planned.

## 2013-12-27 NOTE — Progress Notes (Signed)
Hunter Porter has completed 20 fractions to his prostate.  He reports an increase in urinary frquency at night and is getting up 2 times per night.  He denies dysuria, hematuria, diarrhea and skin irritation.  He reports that he is getting tired easier.

## 2013-12-28 ENCOUNTER — Ambulatory Visit
Admission: RE | Admit: 2013-12-28 | Discharge: 2013-12-28 | Disposition: A | Discharge status: Home / Self Care | Payer: Medicare Other | Source: Ambulatory Visit | Attending: Radiation Oncology | Admitting: Radiation Oncology

## 2013-12-28 DIAGNOSIS — Z510 Encounter for antineoplastic radiation therapy: Secondary | ICD-10-CM | POA: Diagnosis not present

## 2013-12-29 ENCOUNTER — Ambulatory Visit
Admission: RE | Admit: 2013-12-29 | Discharge: 2013-12-29 | Disposition: A | Discharge status: Home / Self Care | Payer: Medicare Other | Source: Ambulatory Visit | Attending: Radiation Oncology | Admitting: Radiation Oncology

## 2013-12-29 DIAGNOSIS — Z510 Encounter for antineoplastic radiation therapy: Secondary | ICD-10-CM | POA: Diagnosis not present

## 2013-12-30 ENCOUNTER — Encounter: Payer: Self-pay | Admitting: Radiation Oncology

## 2013-12-30 ENCOUNTER — Ambulatory Visit
Admission: RE | Admit: 2013-12-30 | Discharge: 2013-12-30 | Disposition: A | Discharge status: Home / Self Care | Payer: Medicare Other | Source: Ambulatory Visit | Attending: Radiation Oncology | Admitting: Radiation Oncology

## 2013-12-30 VITALS — BP 127/79 | HR 76 | Temp 98.0°F | Resp 10 | Wt 219.0 lb

## 2013-12-30 DIAGNOSIS — Z510 Encounter for antineoplastic radiation therapy: Secondary | ICD-10-CM | POA: Diagnosis not present

## 2013-12-30 DIAGNOSIS — C61 Malignant neoplasm of prostate: Secondary | ICD-10-CM

## 2013-12-30 NOTE — Progress Notes (Signed)
  Radiation Oncology         ([phone] ________________________________    Name: Hunter Porter MRN: [id number]  Date: 12/30/2013  DOB: [birth year]  Weekly Radiation Therapy Management    ICD-9-CM ICD-10-CM   1. Prostate cancer 185 C61     Current Dose: 41.4 Gy     Planned Dose:  68.4 Gy  Narrative . . . . . . . . The patient presents for routine under treatment assessment.                                 He is currently in no pain. Pt complains of, Fatigue and Generalized Weakness.  Reports sweats.  Pt states they urinate 2 - 3 times per night.  Pt reports, a bowel movement every day. The patient eats a regular, healthy diet.                                 Set-up films were reviewed.                                 The chart was checked. Physical Findings. . .  weight is 219 lb (99.338 kg). His oral temperature is 98 F (36.7 C). His blood pressure is 127/79 and his pulse is 76. His respiration is 10 and oxygen saturation is 96%. . Weight essentially stable.  No significant changes. Impression . . . . . . . The patient is tolerating radiation. Plan . . . . . . . . . . . . Continue treatment as planned.  ________________________________  Sheral Apley. [name], M.D.

## 2013-12-30 NOTE — Progress Notes (Signed)
He is currently in no pain. Pt complains of, Fatigue and Generalized Weakness.  Reports sweats.  Pt states they urinate 2 - 3 times per night.  Pt reports, a bowel movement every day. The patient eats a regular, healthy diet.

## 2013-12-31 ENCOUNTER — Ambulatory Visit
Admission: RE | Admit: 2013-12-31 | Discharge: 2013-12-31 | Disposition: A | Discharge status: Home / Self Care | Payer: Medicare Other | Source: Ambulatory Visit | Attending: Radiation Oncology | Admitting: Radiation Oncology

## 2013-12-31 DIAGNOSIS — Z510 Encounter for antineoplastic radiation therapy: Secondary | ICD-10-CM | POA: Diagnosis not present

## 2014-01-03 ENCOUNTER — Ambulatory Visit
Admission: RE | Admit: 2014-01-03 | Discharge: 2014-01-03 | Disposition: A | Discharge status: Home / Self Care | Payer: Medicare Other | Source: Ambulatory Visit | Attending: Radiation Oncology | Admitting: Radiation Oncology

## 2014-01-03 DIAGNOSIS — Z510 Encounter for antineoplastic radiation therapy: Secondary | ICD-10-CM | POA: Diagnosis not present

## 2014-01-04 ENCOUNTER — Ambulatory Visit
Admission: RE | Admit: 2014-01-04 | Discharge: 2014-01-04 | Disposition: A | Discharge status: Home / Self Care | Payer: Medicare Other | Source: Ambulatory Visit | Attending: Radiation Oncology | Admitting: Radiation Oncology

## 2014-01-04 DIAGNOSIS — Z510 Encounter for antineoplastic radiation therapy: Secondary | ICD-10-CM | POA: Diagnosis not present

## 2014-01-05 ENCOUNTER — Ambulatory Visit
Admission: RE | Admit: 2014-01-05 | Discharge: 2014-01-05 | Disposition: A | Discharge status: Home / Self Care | Payer: Medicare Other | Source: Ambulatory Visit | Attending: Radiation Oncology | Admitting: Radiation Oncology

## 2014-01-05 DIAGNOSIS — Z510 Encounter for antineoplastic radiation therapy: Secondary | ICD-10-CM | POA: Diagnosis not present

## 2014-01-06 ENCOUNTER — Ambulatory Visit: Payer: Medicare Other

## 2014-01-07 ENCOUNTER — Ambulatory Visit: Payer: Medicare Other

## 2014-01-07 ENCOUNTER — Ambulatory Visit: Payer: Medicare Other | Admitting: Radiation Oncology

## 2014-01-10 ENCOUNTER — Ambulatory Visit: Payer: Medicare Other

## 2014-01-11 ENCOUNTER — Ambulatory Visit
Admission: RE | Admit: 2014-01-11 | Discharge: 2014-01-11 | Disposition: A | Discharge status: Home / Self Care | Payer: Medicare Other | Source: Ambulatory Visit | Attending: Radiation Oncology | Admitting: Radiation Oncology

## 2014-01-11 DIAGNOSIS — Z510 Encounter for antineoplastic radiation therapy: Secondary | ICD-10-CM | POA: Diagnosis not present

## 2014-01-12 ENCOUNTER — Ambulatory Visit
Admission: RE | Admit: 2014-01-12 | Discharge: 2014-01-12 | Disposition: A | Discharge status: Home / Self Care | Payer: Medicare Other | Source: Ambulatory Visit | Attending: Radiation Oncology | Admitting: Radiation Oncology

## 2014-01-12 DIAGNOSIS — Z510 Encounter for antineoplastic radiation therapy: Secondary | ICD-10-CM | POA: Diagnosis not present

## 2014-01-13 ENCOUNTER — Ambulatory Visit
Admission: RE | Admit: 2014-01-13 | Discharge: 2014-01-13 | Disposition: A | Discharge status: Home / Self Care | Payer: Medicare Other | Source: Ambulatory Visit | Attending: Radiation Oncology | Admitting: Radiation Oncology

## 2014-01-13 DIAGNOSIS — Z510 Encounter for antineoplastic radiation therapy: Secondary | ICD-10-CM | POA: Diagnosis not present

## 2014-01-14 ENCOUNTER — Ambulatory Visit
Admission: RE | Admit: 2014-01-14 | Discharge: 2014-01-14 | Disposition: A | Discharge status: Home / Self Care | Payer: Medicare Other | Source: Ambulatory Visit | Attending: Radiation Oncology | Admitting: Radiation Oncology

## 2014-01-14 ENCOUNTER — Encounter: Payer: Self-pay | Admitting: Radiation Oncology

## 2014-01-14 VITALS — BP 149/69 | HR 74 | Resp 18 | Wt 217.8 lb

## 2014-01-14 DIAGNOSIS — Z510 Encounter for antineoplastic radiation therapy: Secondary | ICD-10-CM | POA: Diagnosis not present

## 2014-01-14 DIAGNOSIS — C61 Malignant neoplasm of prostate: Secondary | ICD-10-CM

## 2014-01-14 NOTE — Progress Notes (Signed)
  Radiation Oncology         [phone] ________________________________    Name: BRODI KARI MRN: [id number]  Date: 01/14/2014  DOB: 01-15-1946  Weekly Radiation Therapy Management    ICD-9-CM ICD-10-CM   1. Prostate cancer 185 C61     Current Dose: 55.8 Gy     Planned Dose:  68.4 Gy  Narrative . . . . . . . . The patient presents for routine under treatment assessment.                                 Weight and vitals stable. Denies pain. Reports nocturia x 3. Reports fatigue and night sweats. Denies hematuria, dysuria, or diarrhea.                                 Set-up films were reviewed.                                 The chart was checked. Physical Findings. . .  weight is 217 lb 12.8 oz (98.793 kg). His blood pressure is 149/69 and his pulse is 74. His respiration is 18. . Weight essentially stable.  No significant changes. Impression . . . . . . . The patient is tolerating radiation. Plan . . . . . . . . . . . . Continue treatment as planned.  ________________________________  Sheral Apley. [name], M.D.

## 2014-01-14 NOTE — Progress Notes (Signed)
Weight and vitals stable. Denies pain. Reports nocturia x 3. Reports fatigue and night sweats. Denies hematuria, dysuria, or diarrhea.

## 2014-01-16 ENCOUNTER — Ambulatory Visit
Admission: RE | Admit: 2014-01-16 | Discharge: 2014-01-16 | Disposition: A | Discharge status: Home / Self Care | Payer: Medicare Other | Source: Ambulatory Visit | Attending: Radiation Oncology | Admitting: Radiation Oncology

## 2014-01-16 DIAGNOSIS — Z510 Encounter for antineoplastic radiation therapy: Secondary | ICD-10-CM | POA: Diagnosis not present

## 2014-01-17 ENCOUNTER — Ambulatory Visit
Admission: RE | Admit: 2014-01-17 | Discharge: 2014-01-17 | Disposition: A | Discharge status: Home / Self Care | Payer: Medicare Other | Source: Ambulatory Visit | Attending: Radiation Oncology | Admitting: Radiation Oncology

## 2014-01-17 DIAGNOSIS — Z510 Encounter for antineoplastic radiation therapy: Secondary | ICD-10-CM | POA: Diagnosis not present

## 2014-01-18 ENCOUNTER — Ambulatory Visit
Admission: RE | Admit: 2014-01-18 | Discharge: 2014-01-18 | Disposition: A | Discharge status: Home / Self Care | Payer: Medicare Other | Source: Ambulatory Visit | Attending: Radiation Oncology | Admitting: Radiation Oncology

## 2014-01-18 DIAGNOSIS — Z510 Encounter for antineoplastic radiation therapy: Secondary | ICD-10-CM | POA: Diagnosis not present

## 2014-01-19 ENCOUNTER — Ambulatory Visit: Payer: Medicare Other

## 2014-01-19 ENCOUNTER — Encounter: Payer: Self-pay | Admitting: Radiation Oncology

## 2014-01-19 ENCOUNTER — Ambulatory Visit
Admission: RE | Admit: 2014-01-19 | Discharge: 2014-01-19 | Disposition: A | Discharge status: Home / Self Care | Payer: Medicare Other | Source: Ambulatory Visit | Attending: Radiation Oncology | Admitting: Radiation Oncology

## 2014-01-19 VITALS — BP 140/76 | HR 70 | Resp 16 | Wt 213.4 lb

## 2014-01-19 DIAGNOSIS — Z510 Encounter for antineoplastic radiation therapy: Secondary | ICD-10-CM | POA: Diagnosis not present

## 2014-01-19 DIAGNOSIS — C61 Malignant neoplasm of prostate: Secondary | ICD-10-CM

## 2014-01-19 NOTE — Progress Notes (Signed)
  Radiation Oncology         ([phone] ________________________________    Name: Hunter Porter MRN: [id number]  Date: 01/19/2014  DOB: 09-20-1945  Weekly Radiation Therapy Management    ICD-9-CM ICD-10-CM   1. Prostate cancer 185 C61     Current Dose: 63 Gy     Planned Dose:  68.4 Gy  Narrative . . . . . . . . The patient presents for routine under treatment assessment.                                 Weight and vitals stable. Denies pain. Reports nocturia x 3. Reports fatigue and night sweats. Denies hematuria, dysuria, or diarrhea.                                 Set-up films were reviewed.                                 The chart was checked. Physical Findings. . .  weight is 213 lb 6.4 oz (96.798 kg). His blood pressure is 140/76 and his pulse is 70. His respiration is 16. . Weight essentially stable.  No significant changes. Impression . . . . . . . The patient is tolerating radiation. Plan . . . . . . . . . . . . Continue treatment as planned.  ________________________________  Sheral Apley. [name], M.D.

## 2014-01-19 NOTE — Progress Notes (Signed)
Weight and vitals stable. Denies pain. Reports nocturia x 3. Reports fatigue and night sweats. Denies hematuria, dysuria, or diarrhea.

## 2014-01-21 ENCOUNTER — Ambulatory Visit: Payer: Medicare Other

## 2014-01-24 ENCOUNTER — Ambulatory Visit: Payer: Medicare Other

## 2014-01-24 ENCOUNTER — Ambulatory Visit
Admission: RE | Admit: 2014-01-24 | Discharge: 2014-01-24 | Disposition: A | Discharge status: Home / Self Care | Payer: Medicare Other | Source: Ambulatory Visit | Attending: Radiation Oncology | Admitting: Radiation Oncology

## 2014-01-24 DIAGNOSIS — Z510 Encounter for antineoplastic radiation therapy: Secondary | ICD-10-CM | POA: Diagnosis not present

## 2014-01-25 ENCOUNTER — Ambulatory Visit
Admission: RE | Admit: 2014-01-25 | Discharge: 2014-01-25 | Disposition: A | Discharge status: Home / Self Care | Payer: Medicare Other | Source: Ambulatory Visit | Attending: Radiation Oncology | Admitting: Radiation Oncology

## 2014-01-25 ENCOUNTER — Ambulatory Visit: Payer: Medicare Other

## 2014-01-25 DIAGNOSIS — Z510 Encounter for antineoplastic radiation therapy: Secondary | ICD-10-CM | POA: Diagnosis not present

## 2014-01-26 ENCOUNTER — Ambulatory Visit
Admission: RE | Admit: 2014-01-26 | Discharge: 2014-01-26 | Disposition: A | Discharge status: Home / Self Care | Payer: Medicare Other | Source: Ambulatory Visit | Attending: Radiation Oncology | Admitting: Radiation Oncology

## 2014-01-26 ENCOUNTER — Encounter: Payer: Self-pay | Admitting: Radiation Oncology

## 2014-01-26 ENCOUNTER — Ambulatory Visit: Payer: Medicare Other

## 2014-01-26 VITALS — BP 140/79 | HR 76 | Resp 16 | Wt 215.1 lb

## 2014-01-26 DIAGNOSIS — Z510 Encounter for antineoplastic radiation therapy: Secondary | ICD-10-CM | POA: Diagnosis not present

## 2014-01-26 DIAGNOSIS — C61 Malignant neoplasm of prostate: Secondary | ICD-10-CM

## 2014-01-26 NOTE — Progress Notes (Signed)
Weight and vitals stable. Denies pain. Reports nocturia x 2. Reports mild fatigue. Reports night sweats continue. Denies hematuria, dysuria or diarrhea. Schedule to follow up with [facility] but, unsure of the date. One month follow up appointment card given.

## 2014-01-27 NOTE — Progress Notes (Signed)
  Radiation Oncology         [phone] ________________________________    Name: Hunter Porter MRN: [id number]  Date: 01/26/2014  DOB: [birth year]  Weekly Radiation Therapy Management    ICD-9-CM ICD-10-CM   1. Prostate cancer 185 C61     Current Dose: 68.4 Gy     Planned Dose:  68.4 Gy  Narrative . . . . . . . . The patient presents for routine under treatment assessment.                                 Weight and vitals stable. Denies pain. Reports nocturia x 2. Reports mild fatigue. Reports night sweats continue. Denies hematuria, dysuria or diarrhea..                                  Set-up films were reviewed.                                 The chart was checked. Physical Findings. . .  weight is 215 lb 1.6 oz (97.569 kg). His blood pressure is 140/79 and his pulse is 76. His respiration is 16. . Weight essentially stable.  No significant changes. Impression . . . . . . . The patient is tolerating radiation. Plan . . . . . . . . . . . . Treatment completed.   Schedule to follow up with [facility] but, unsure of the date. One month follow up appointment card given  ________________________________  Sheral Apley. [name], M.D.

## 2014-01-27 NOTE — Progress Notes (Signed)
  Radiation Oncology         [phone]) [phone] ________________________________  Name: Hunter Porter MRN: [id number]  Date: 01/26/2014  DOB: 1945-08-10  End of Treatment Note  Diagnosis:   68 y.o. gentleman with stage pT3a adenocarcinoma of the prostate with a Gleason's score of 4+5 and a rising post-operative PSA of 0.53  Indication for treatment:  Curative, salvage Prostatic Fossa Radiotherapy       Radiation treatment dates:    11/29/2013-01/26/2014  Site/dose:   The prostatic fossa was treated to 68.4 Gy in 38 fractions of 1.8 Gy  Beams/energy:   The prostatic fossa was boosted using helical intensity modulated radiotherapy delivering 6 megavolt photons. Image guidance was performed with megavoltage CT studies prior to each fraction. He was immobilized with a body fix lower extremity mold.  Narrative: The patient tolerated radiation treatment relatively well.   He experienced mild urinary symptoms and minor fatigue.  Plan: The patient has completed radiation treatment. The patient will return to radiation oncology clinic for routine followup in one month. I advised them to call or return sooner if they have any questions or concerns related to their recovery or treatment. ________________________________  Sheral Apley. [name], M.D.

## 2014-03-04 ENCOUNTER — Encounter: Payer: Self-pay | Admitting: Radiation Oncology

## 2014-03-04 ENCOUNTER — Ambulatory Visit
Admission: RE | Admit: 2014-03-04 | Discharge: 2014-03-04 | Disposition: A | Discharge status: Home / Self Care | Payer: Medicare Other | Source: Ambulatory Visit | Attending: Radiation Oncology | Admitting: Radiation Oncology

## 2014-03-04 VITALS — BP 136/72 | HR 68 | Resp 16 | Wt 216.4 lb

## 2014-03-04 DIAGNOSIS — C61 Malignant neoplasm of prostate: Secondary | ICD-10-CM

## 2014-03-04 NOTE — Progress Notes (Signed)
Patient returns today for one month follow up s/p xrt to prostate. Denies pain. Weight and vitals stable. Scheduled to follow up with urologist on February 22 for blood work and 24 to receive results. Reports energy level has greatly improved. Denies diarrhea. Denies urinary incontinence. Reports nausea one week s/p treatment has resolved. Denies dysuria or hematuria. Reports nocturia x2.

## 2014-03-04 NOTE — Progress Notes (Signed)
Radiation Oncology         [phone] ________________________________  Name: Hunter Porter MRN: [id number]  Date: 03/04/2014  DOB: 1945-06-17  Follow-Up Visit Note  CC: [name], PA-C  [name], MD  Diagnosis:   69 y.o. gentleman with stage pT3a adenocarcinoma of the prostate with a Gleason's score of 4+5 and a rising post-operative PSA of 0.53 s/p curative, salvage Prostatic Fossa Radiotherapy 11/29/2013-01/26/2014 to 68.4 Gy     ICD-9-CM ICD-10-CM   1. Prostate cancer 185 C61     Interval Since Last Radiation:  4  weeks  Narrative:  The patient returns today for routine follow-up.  Patient returns today for one month follow up s/p xrt to prostate. Denies pain. Weight and vitals stable. Scheduled to follow up with urologist on February 22 for blood work and 24 to receive results. Reports energy level has greatly improved. Denies diarrhea. Denies urinary incontinence. Reports nausea one week s/p treatment has resolved. Denies dysuria or hematuria. Reports nocturia x2.                               ALLERGIES:  is allergic to hydrocodone-acetaminophen; bee venom; and penicillins.  Meds: Current Outpatient Prescriptions  Medication Sig Dispense Refill  . ALPRAZolam (XANAX) 1 MG tablet Take 0.25 mg by mouth 2 (two) times daily as needed for anxiety.    Marland Kitchen aspirin 81 MG tablet Take 81 mg by mouth daily.    . calcium carbonate 1250 MG capsule Take 1,250 mg by mouth daily.    Marland Kitchen EPINEPHrine 0.3 mg/0.3 mL IJ SOAJ injection Inject 0.3 mLs (0.3 mg total) into the muscle once. For allergic reaction to bee stings 1 Device 3  . magnesium 30 MG tablet Take 30 mg by mouth daily.    . Multiple Vitamin (MULTIVITAMIN) capsule Take 1 capsule by mouth daily.    . naproxen (NAPROSYN) 375 MG tablet Take 1 tablet (375 mg total) by mouth 2 (two) times daily with a meal. As needed 60 tablet 11  . NEXIUM 40 MG capsule TAKE ONE CAPSULE DAILY. 30 capsule 6  . Omega-3 Fatty Acids (FISH OIL) 1000  MG CAPS Take by mouth.    . Potassium 99 MG TABS Take 1 tablet by mouth daily.      Marland Kitchen zinc gluconate 50 MG tablet Take 50 mg by mouth daily.    . cyclobenzaprine (FLEXERIL) 10 MG tablet      No current facility-administered medications for this encounter.    Physical Findings: The patient is in no acute distress. Patient is alert and oriented.  weight is 216 lb 6.4 oz (98.158 kg). His blood pressure is 136/72 and his pulse is 68. His respiration is 16. .  No significant changes.  Lab Findings: Lab Results  Component Value Date   WBC 9.0 03/15/2013   HGB 11.7* 03/18/2013   HCT 33.5* 03/18/2013   MCV 93.7 03/15/2013   PLT 211 03/15/2013   Impression:  The patient is recovering from the effects of radiation.    Plan:  He will continue to follow-up with urology for ongoing PSA determinations.  I will look forward to following his response through their correspondence, and be happy to participate in care if clinically indicated.  I talked to the patient about what to expect in the future, including his risk for erectile dysfunction and rectal bleeding.  I encouraged him to call or return to the office  if he has any question about his previous radiation or possible radiation effects.  He was comfortable with this plan  _____________________________________  Sheral Apley. [name], M.D.

## 2014-05-12 ENCOUNTER — Encounter: Payer: Self-pay | Admitting: Physician Assistant

## 2014-05-12 ENCOUNTER — Ambulatory Visit (INDEPENDENT_AMBULATORY_CARE_PROVIDER_SITE_OTHER): Payer: Medicare Other | Admitting: Physician Assistant

## 2014-05-12 VITALS — BP 130/80 | HR 76 | Temp 97.3°F | Resp 14 | Ht 68.0 in | Wt 210.0 lb

## 2014-05-12 DIAGNOSIS — M778 Other enthesopathies, not elsewhere classified: Secondary | ICD-10-CM

## 2014-05-12 DIAGNOSIS — M7582 Other shoulder lesions, left shoulder: Secondary | ICD-10-CM | POA: Diagnosis not present

## 2014-05-12 MED ORDER — PREDNISONE 20 MG PO TABS: ORAL_TABLET | ORAL | Status: DC

## 2014-05-12 MED ORDER — ALPRAZOLAM 1 MG PO TABS: ORAL_TABLET | ORAL | Status: DC

## 2014-05-12 NOTE — Progress Notes (Signed)
Patient ID: Hunter Porter MRN: [id number], DOB: 08/13/1945, 69 y.o. Date of Encounter: 05/12/2014, 3:54 PM    Chief Complaint:  Chief Complaint  Patient presents with  . L Shoulder Pain    states that he fell during Ice and snow, but thought shoulder had healed- reports that he was golfing yesterday when he noted pain in swing     HPI: 69 y.o. year old white male states that when we had some snow and ice about a month ago he fell and landed on his left shoulder.  Says that he was no longer having much discomfort in the shoulder. Says that since that snow and ice storm, he has played golf multiple times without much problem. However says that when he was playing golf this past Tuesday which was 05/10/14, he had pulled the golf club back to swing, then when he pulled it forward to hit the ball--he could feel tendons popping over each other up in that left shoulder. Says that it felt tight like a tight rubber band. Points to the anterior aspect of the left shoulder as the area of the discomfort and that tight sensation. Says that he has been taking Naprosyn and applying heating pad to the area and that the achiness there has improved but he wanted to get it checked.     Home Meds:   Outpatient Prescriptions Prior to Visit  Medication Sig Dispense Refill  . aspirin 81 MG tablet Take 81 mg by mouth daily.    . calcium carbonate 1250 MG capsule Take 1,250 mg by mouth daily.    Marland Kitchen EPINEPHrine 0.3 mg/0.3 mL IJ SOAJ injection Inject 0.3 mLs (0.3 mg total) into the muscle once. For allergic reaction to bee stings 1 Device 3  . magnesium 30 MG tablet Take 30 mg by mouth daily.    . Multiple Vitamin (MULTIVITAMIN) capsule Take 1 capsule by mouth daily.    . naproxen (NAPROSYN) 375 MG tablet Take 1 tablet (375 mg total) by mouth 2 (two) times daily with a meal. As needed 60 tablet 11  . NEXIUM 40 MG capsule TAKE ONE CAPSULE DAILY. 30 capsule 6  . Omega-3 Fatty Acids (FISH OIL) 1000 MG CAPS  Take by mouth.    . Potassium 99 MG TABS Take 1 tablet by mouth daily.      Marland Kitchen zinc gluconate 50 MG tablet Take 50 mg by mouth daily.    Marland Kitchen ALPRAZolam (XANAX) 1 MG tablet Take 0.25 mg by mouth 2 (two) times daily as needed for anxiety.    . cyclobenzaprine (FLEXERIL) 10 MG tablet      No facility-administered medications prior to visit.    Allergies:  Allergies  Allergen Reactions  . Hydrocodone-Acetaminophen Nausea And Vomiting  . Bee Venom   . Penicillins Rash      Review of Systems: See HPI for pertinent ROS. All other ROS negative.    Physical Exam: Blood pressure 130/80, pulse 76, temperature 97.3 F (36.3 C), temperature source Oral, resp. rate 14, height 5\' 8"  (1.727 m), weight 210 lb (95.255 kg)., Body mass index is 31.94 kg/(m^2). General: WNWD WM.  Appears in no acute distress. Neck: Supple. No thyromegaly. No lymphadenopathy. Lungs: Clear bilaterally to auscultation without wheezes, rales, or rhonchi. Breathing is unlabored. Heart: Regular rhythm. No murmurs, rubs, or gallops. Msk:  Strength and tone normal for age. Left Shoulder: Tenderness with palpation of the AC joint.  Positive tenderness with pal[pation of the anterior aspect at the  joint line of the shoulder joint.  Forward extension, empty can are normal. Abduction normal. Forearm strength 5/5 with both abduction and abduction. Extremities/Skin: Warm and dry. Neuro: Alert and oriented X 3. Moves all extremities spontaneously. Gait is normal. CNII-XII grossly in tact. Psych:  Responds to questions appropriately with a normal affect.     ASSESSMENT AND PLAN:  69 y.o. year old male with  1. Tendonitis of shoulder, left - predniSONE (DELTASONE) 20 MG tablet; Take 3 daily for 2 days, then 2 daily for 2 days, then 1 daily for 2 days.  Dispense: 12 tablet; Refill: 0 Also demonstrated a stretch for him to do. He is to do this stretch throughout the day every day for a few weeks. Follow-up if does not have  significant improvement with this prednisone taper and if symptoms do not resolve over the next couple weeks.  [address] [place], [place], [facility] 05/12/2014 3:54 PM

## 2014-07-12 ENCOUNTER — Other Ambulatory Visit: Payer: Self-pay | Admitting: Physician Assistant

## 2014-07-12 NOTE — Telephone Encounter (Signed)
Medication refilled per protocol. 

## 2014-09-01 ENCOUNTER — Other Ambulatory Visit: Payer: Self-pay | Admitting: Physician Assistant

## 2014-09-01 NOTE — Telephone Encounter (Signed)
Medication refilled per protocol. 

## 2014-10-03 ENCOUNTER — Other Ambulatory Visit: Payer: Self-pay | Admitting: Physician Assistant

## 2014-10-03 NOTE — Telephone Encounter (Signed)
Medication refilled per protocol. 

## 2015-01-11 ENCOUNTER — Other Ambulatory Visit: Payer: Self-pay | Admitting: Physician Assistant

## 2015-01-11 NOTE — Telephone Encounter (Signed)
Refill appropriate and filled per protocol. 

## 2015-02-26 HISTORY — PX: COLONOSCOPY: SHX174

## 2015-03-07 ENCOUNTER — Other Ambulatory Visit: Payer: Self-pay | Admitting: Physician Assistant

## 2015-03-07 NOTE — Telephone Encounter (Signed)
Medication refilled per protocol. 

## 2015-04-15 ENCOUNTER — Other Ambulatory Visit: Payer: Self-pay | Admitting: Physician Assistant

## 2015-04-17 ENCOUNTER — Encounter: Payer: Self-pay | Admitting: Family Medicine

## 2015-04-17 NOTE — Telephone Encounter (Signed)
Medication refill for one time only.  Patient needs to be seen.  Letter sent for patient to call and schedule 

## 2015-05-22 ENCOUNTER — Other Ambulatory Visit: Payer: Self-pay | Admitting: Physician Assistant

## 2015-05-22 ENCOUNTER — Encounter: Payer: Self-pay | Admitting: Family Medicine

## 2015-05-22 NOTE — Telephone Encounter (Signed)
Refill denied.  Pt has not been seen in over 1 year.  Second letter sent to make appt.

## 2015-05-24 ENCOUNTER — Telehealth: Payer: Self-pay | Admitting: Physician Assistant

## 2015-05-24 MED ORDER — NAPROXEN 375 MG PO TABS: ORAL_TABLET | ORAL | Status: DC

## 2015-05-24 NOTE — Telephone Encounter (Signed)
1 month refill sent.

## 2015-05-24 NOTE — Telephone Encounter (Signed)
Pt has scheduled an appt for a cpe on 4/6 and is requesting a refill of Neproxin to last until then. [place] Pt # [phone]

## 2015-05-30 ENCOUNTER — Encounter: Payer: Self-pay | Admitting: Gastroenterology

## 2015-06-01 ENCOUNTER — Ambulatory Visit (INDEPENDENT_AMBULATORY_CARE_PROVIDER_SITE_OTHER): Payer: Medicare Other | Admitting: Physician Assistant

## 2015-06-01 ENCOUNTER — Encounter: Payer: Self-pay | Admitting: Physician Assistant

## 2015-06-01 VITALS — BP 126/80 | HR 68 | Temp 97.8°F | Resp 18 | Ht 66.5 in | Wt 219.0 lb

## 2015-06-01 DIAGNOSIS — C61 Malignant neoplasm of prostate: Secondary | ICD-10-CM

## 2015-06-01 DIAGNOSIS — Z0000 Encounter for general adult medical examination without abnormal findings: Secondary | ICD-10-CM | POA: Diagnosis not present

## 2015-06-01 DIAGNOSIS — F419 Anxiety disorder, unspecified: Secondary | ICD-10-CM | POA: Diagnosis not present

## 2015-06-01 DIAGNOSIS — G4700 Insomnia, unspecified: Secondary | ICD-10-CM | POA: Diagnosis not present

## 2015-06-01 DIAGNOSIS — Z23 Encounter for immunization: Secondary | ICD-10-CM | POA: Diagnosis not present

## 2015-06-01 LAB — COMPLETE METABOLIC PANEL WITH GFR
ALBUMIN: 4.1 g/dL (ref 3.6–5.1)
ALK PHOS: 76 U/L (ref 40–115)
ALT: 26 U/L (ref 9–46)
AST: 23 U/L (ref 10–35)
BUN: 15 mg/dL (ref 7–25)
CO2: 24 mmol/L (ref 20–31)
Calcium: 8.8 mg/dL (ref 8.6–10.3)
Chloride: 104 mmol/L (ref 98–110)
Creat: 0.73 mg/dL (ref 0.70–1.25)
GFR, Est African American: >89 mL/min (ref >=60)
GFR, Est Non African American: >89 mL/min (ref >=60)
GLUCOSE: 94 mg/dL (ref 70–99)
Potassium: 4.2 mmol/L (ref 3.5–5.3)
SODIUM: 141 mmol/L (ref 135–146)
TOTAL PROTEIN: 6.9 g/dL (ref 6.1–8.1)
Total Bilirubin: 0.4 mg/dL (ref 0.2–1.2)

## 2015-06-01 LAB — CBC WITH DIFFERENTIAL/PLATELET
Basophils Absolute: 52 cells/uL (ref 0–200)
Basophils Relative: 1 %
EOS PCT: 5 %
Eosinophils Absolute: 260 cells/uL (ref 15–500)
HCT: 43.2 % (ref 38.5–50.0)
HEMOGLOBIN: 14.2 g/dL (ref 13.0–17.0)
Lymphocytes Relative: 15 %
Lymphs Abs: 780 cells/uL — ABNORMAL LOW (ref 850–3900)
MCH: 31.9 pg (ref 27.0–33.0)
MCHC: 32.9 g/dL (ref 32.0–36.0)
MCV: 97.1 fL (ref 80.0–100.0)
MONO ABS: 364 {cells}/uL (ref 200–950)
MPV: 9.2 fL (ref 7.5–12.5)
Monocytes Relative: 7 %
Neutro Abs: 3744 cells/uL (ref 1500–7800)
Neutrophils Relative %: 72 %
PLATELETS: 218 10*3/uL (ref 140–400)
RBC: 4.45 MIL/uL (ref 4.20–5.80)
RDW: 13.5 % (ref 11.0–15.0)
WBC: 5.2 10*3/uL (ref 3.8–10.8)

## 2015-06-01 LAB — LIPID PANEL
Cholesterol: 176 mg/dL (ref 125–200)
HDL: 43 mg/dL (ref >=40)
LDL CALC: 97 mg/dL (ref <130)
Total CHOL/HDL Ratio: 4.1 Ratio (ref <=5.0)
Triglycerides: 181 mg/dL — ABNORMAL HIGH (ref <150)
VLDL: 36 mg/dL — ABNORMAL HIGH (ref <30)

## 2015-06-01 LAB — TSH: TSH: 3.13 m[IU]/L (ref 0.40–4.50)

## 2015-06-01 MED ORDER — NAPROXEN 375 MG PO TABS: ORAL_TABLET | ORAL | Status: DC

## 2015-06-01 MED ORDER — ALPRAZOLAM 1 MG PO TABS: ORAL_TABLET | ORAL | Status: DC

## 2015-06-01 NOTE — Progress Notes (Signed)
Patient ID: Hunter Porter MRN: [id number], DOB: 1945/12/26 70 y.o. Date of Encounter: 06/01/2015, 9:57 AM    Chief Complaint: Physical (CPE)  HPI: 70 y.o. y/o white male here for CPE.   Says he has been undergoing treatments for prostate cancer but says he has completed those. Says his last PSA level was 0 and his next follow-up is 6 months which she says is due in August.  Says he has been seeing no other specialists her medical providers.  No complaints or concerns.  Says that he would like a refill on the Xanax. Says he only takes 1/4 of a pill at a time. He needs it occasionally for sleep. Also needs refill on Naprosyn. Says that pretty much every day he needs at least one per day and sometimes another one later for twice a day. He makes sure to take it with food and tries to use it sparingly.   Review of Systems: Consitutional: No fever, chills, fatigue, night sweats, lymphadenopathy, or weight changes. Eyes: No visual changes, eye redness, or discharge. ENT/Mouth: Ears: No otalgia, tinnitus, hearing loss, discharge. Nose: No congestion, rhinorrhea, sinus pain, or epistaxis. Throat: No sore throat, post nasal drip, or teeth pain. Cardiovascular: No CP, palpitations, diaphoresis, DOE, edema, orthopnea, PND. Respiratory: No cough, hemoptysis, SOB, or wheezing. Gastrointestinal: No anorexia, dysphagia, reflux, pain, nausea, vomiting, hematemesis, diarrhea, constipation, BRBPR, or melena. Genitourinary: No dysuria, frequency, urgency, hematuria, incontinence, nocturia, decreased urinary stream, discharge, impotence, or testicular pain/masses. Musculoskeletal: No decreased ROM, myalgias, stiffness, joint swelling, or weakness. Skin: No rash, erythema, lesion changes, pain, warmth, jaundice, or pruritis. Neurological: No headache, dizziness, syncope, seizures, tremors, memory loss, coordination problems, or paresthesias. Psychological: No anxiety, depression, hallucinations,  SI/HI. Endocrine: No fatigue, polydipsia, polyphagia, polyuria, or known diabetes. All other systems were reviewed and are otherwise negative.  Past Medical History  Diagnosis Date  . Hyperlipidemia   . Substance abuse   . Ulcer approx 1970    HX peptic ulcer  . Adenomatous colon polyp   . GERD (gastroesophageal reflux disease)   . Anxiety   . Thyroid disease     graves  . Prostate cancer ([place]) 01/20/2013  . Arthritis   . H/O hiatal hernia   . H/O hemorrhoids   . Heart murmur 2007    per pt history- possible murmur by PCp- states negative stress test  . Sleep apnea 2007    no followup per pt following study     Past Surgical History  Procedure Laterality Date  . Cataract extraction w/ intraocular lens  implant, bilateral    . Upper gastrointestinal endoscopy    . Colonoscopy    . Tonsillectomy    . Transurethral resection of prostate  12/01/2012    Dr Risa Grill  . Robot assisted laparoscopic radical prostatectomy N/A 03/17/2013    Procedure: ROBOTIC ASSISTED LAPAROSCOPIC RADICAL PROSTATECTOMY;  Surgeon: [name], MD;  Location: [facility];  Service: Urology;  Laterality: N/A;  . Lymphadenectomy Bilateral 03/17/2013    Procedure: LYMPHADENECTOMY PELVIC LYMPH NODE DISSECTION;  Surgeon: [name], MD;  Location: [facility];  Service: Urology;  Laterality: Bilateral;  . Prostate biopsy      Home Meds:  Outpatient Prescriptions Prior to Visit  Medication Sig Dispense Refill  . ALPRAZolam (XANAX) 1 MG tablet Take 1/2 twice a day as needed. 30 tablet 0  . aspirin 81 MG tablet Take 81 mg by mouth daily.    . calcium carbonate 1250 MG capsule Take  1,250 mg by mouth daily.    Marland Kitchen EPINEPHrine 0.3 mg/0.3 mL IJ SOAJ injection Inject 0.3 mLs (0.3 mg total) into the muscle once. For allergic reaction to bee stings 1 Device 3  . magnesium 30 MG tablet Take 30 mg by mouth daily.    . Multiple Vitamin (MULTIVITAMIN) capsule Take 1 capsule by mouth daily.    . naproxen (NAPROSYN) 375 MG  tablet TAKE 1 TABLET TWICE DAILY AS NEEDED FOR PAIN. TAKE WITH FOOD. 60 tablet 0  . NEXIUM 40 MG capsule TAKE ONE CAPSULE DAILY. 30 capsule 6  . Omega-3 Fatty Acids (FISH OIL) 1000 MG CAPS Take by mouth.    . Potassium 99 MG TABS Take 1 tablet by mouth daily.      . predniSONE (DELTASONE) 20 MG tablet Take 3 daily for 2 days, then 2 daily for 2 days, then 1 daily for 2 days. 12 tablet 0  . zinc gluconate 50 MG tablet Take 50 mg by mouth daily.     No facility-administered medications prior to visit.    Allergies:  Allergies  Allergen Reactions  . Hydrocodone-Acetaminophen Nausea And Vomiting  . Bee Venom   . Penicillins Rash    Social History   Social History  . Marital Status: Single    Spouse Name: N/A  . Number of Children: N/A  . Years of Education: N/A   Occupational History  . Not on file.   Social History Main Topics  . Smoking status: Current Every Day Smoker -- 0.80 packs/day    Types: Cigarettes  . Smokeless tobacco: Never Used  . Alcohol Use: 14.4 oz/week    24 Cans of beer per week     Comment: 24 cans beer week  . Drug Use: No  . Sexual Activity: Not Currently   Other Topics Concern  . Not on file   Social History Narrative   Retired.    Plays golf 4 days/week.   Works in yard.    Drinks 6 beers each evening.    Family History  Problem Relation Age of Onset  . Cancer Mother     brain    Physical Exam: Blood pressure 126/80, pulse 68, temperature 97.8 F (36.6 C), temperature source Oral, resp. rate 18, height 5' 6.5" (1.689 m), weight 219 lb (99.338 kg).  General: Well developed, well nourished,WM. Appears in no acute distress. HEENT: Normocephalic, atraumatic. Conjunctiva pink, sclera non-icteric. Pupils 2 mm constricting to 1 mm, round, regular, and equally reactive to light and accomodation. EOMI. Internal auditory canal clear. TMs with good cone of light and without pathology. Nasal mucosa pink. Nares are without discharge. No sinus  tenderness. Oral mucosa pink. Dentition good. Pharynx without exudate.   Neck: Supple. Trachea midline. No thyromegaly. Full ROM. No lymphadenopathy. Lungs: Clear to auscultation bilaterally without wheezes, rales, or rhonchi. Breathing is of normal effort and unlabored. Cardiovascular: RRR with S1 S2. No murmurs, rubs, or gallops. Distal pulses 2+ symmetrically. No carotid or abdominal bruits. Abdomen: Soft, non-tender, non-distended with normoactive bowel sounds. No hepatosplenomegaly or masses. No rebound/guarding. No CVA tenderness. No hernias. Rectal:Deferred. Sees specialist for prostate cancer. Musculoskeletal: Full range of motion and 5/5 strength throughout.  Skin: Warm and moist without erythema, ecchymosis, wounds, or rash. Neuro: A+Ox3. CN II-XII grossly intact. Moves all extremities spontaneously. Full sensation throughout. Normal gait. DTR 2+ throughout upper and lower extremities. Finger to nose intact. Psych:  Responds to questions appropriately with a normal affect.   Assessment/Plan:  70 y.o.  y/o white male here for CPE  -1. Visit for preventive health examination A. Screening Labs: Not checking PSA--This is managed by specialist. - CBC with Differential/Platelet - COMPLETE METABOLIC PANEL WITH GFR - Lipid panel - TSH - VITAMIN D 25 Hydroxy (Vit-D Deficiency, Fractures)  C. Screening For Colorectal Cancer:  He had a colonoscopy 02/13/2005 that showed polyps. He had another colonoscopy 11/16/2010. Positive polyps. Repeat 5 years.  These are performed at [facility] and these results are in [place] and reviewed today. Reviewed with patient that next colonoscopy is due September of this year and he is agreeable and aware to follow-up with this.  D. Immunizations: Flu---------------------N/A Tetanus------------- Discussed that Medicare does not cover this. 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 He is going to call his insurance and find out what the cost would  be and then he may         return for this depending on cost. Pneumococcal------- he received Pneumovax 23 03/18/2013.  Will give Prevnar 13 today. Patient agreeable.----Given here 06/01/2015 Zostavax---------------He states that he did receive Zostavax. He is not sure of exact date says he knows he was at least 70 years old when he had it.   3. Prostate cancer ([place]) Managed by specialists  4. Insomnia Says he uses 1/4 PRN for insomnia.  - ALPRAZolam (XANAX) 1 MG tablet; Take 1/2 twice a day as needed.  Dispense: 30 tablet; Refill: 0  Subjective:   Patient presents for Medicare Annual/Subsequent preventive examination.   Review Past Medical/Family/Social:Are all reviewed today.   Risk Factors  Current exercise habits:  He plays golf about 4 times a week. He is very active in his yard and shop. Dietary issues discussed:  He follows low-cholesterol low-sodium diet.  Cardiac risk factors:  Age, Male  Depression Screen  (Note: if answer to either of the following is "Yes", a more complete depression screening is indicated)  Over the past two weeks, have you felt down, depressed or hopeless? No Over the past two weeks, have you felt little interest or pleasure in doing things? No Have you lost interest or pleasure in daily life? No Do you often feel hopeless? No Do you cry easily over simple problems? No   Activities of Daily Living  In your present state of health, do you have any difficulty performing the following activities?:  Driving? No  Managing money? No  Feeding yourself? No  Getting from bed to chair? No  Climbing a flight of stairs? No  Preparing food and eating?: No  Bathing or showering? No  Getting dressed: No  Getting to the toilet? No  Using the toilet:No  Moving around from place to place: No  In the past year have you fallen or had a near fall?:No  Are you sexually active? No  Do you have more than one partner? No   Hearing Difficulties: No  Do you often  ask people to speak up or repeat themselves? No  Do you experience ringing or noises in your ears? No Do you have difficulty understanding soft or whispered voices? No  Do you feel that you have a problem with memory? No Do you often misplace items? No  Do you feel safe at home? Yes  Cognitive Testing  Alert? Yes Normal Appearance?Yes  Oriented to person? Yes Place? Yes  Time? Yes  Recall of three objects? Yes  Can perform simple calculations? Yes  Displays appropriate judgment?Yes  Can read the correct  time from a watch face?Yes   List the Names of Other Physician/Practitioners you currently use:  He sees specialist for prostate cancer Sees  GI for his colonoscopies Sees no other specialists.  Indicate any recent Medical Services you may have received from other than [facility] providers in the past year (date may be approximate).   Screening Tests / Date------------- All of this information is documented above. See above. Colonoscopy                     Zostavax  Mammogram  Influenza Vaccine  Tetanus/tdap    Assessment:    Annual wellness medicare exam   Plan:    During the course of the visit the patient was educated and counseled about appropriate screening and preventive services including:  Screening mammography  Colorectal cancer screening  Shingles vaccine. Prescription given to that she can get the vaccine at the pharmacy or Medicare part D.  Screen + for depression. PHQ- 9 score of 12 (moderate depression). We discussed the options of counseling versus possibly a medication. I encouraged her strongly think about the counseling. She is going through some medical problems currently and her husband is as well Mrs. been very stressful for her. She says she will think about it. She does have Xanax to use as needed. Though she may benefit from an SSRI for her more depressive type symptoms but she wants to hold off at this time.  I aksed her to please have her  cardioloist send records since we have none on file.  Diet review for nutrition referral? Yes ____ Not Indicated __x__  Patient Instructions (the written plan) was given to the patient.  Medicare Attestation  I have personally reviewed:  The patient's medical and social history  Their use of alcohol, tobacco or illicit drugs  Their current medications and supplements  The patient's functional ability including ADLs,fall risks, home safety risks, cognitive, and hearing and visual impairment  Diet and physical activities  Evidence for depression or mood disorders  The patient's weight, height, BMI, and visual acuity have been recorded in the chart. I have made referrals, counseling, and provided education to the patient based on review of the above and I have provided the patient with a written personalized care plan for preventive services.      Signed:   [address] [place], [place]  06/01/2015 9:57 AM

## 2015-06-01 NOTE — Addendum Note (Signed)
Addended by: Olena Mater on: 06/01/2015 10:44 AM   Modules accepted: Orders

## 2015-06-02 LAB — VITAMIN D 25 HYDROXY (VIT D DEFICIENCY, FRACTURES): Vit D, 25-Hydroxy: 21 ng/mL — ABNORMAL LOW (ref 30–100)

## 2015-06-08 ENCOUNTER — Encounter: Payer: Self-pay | Admitting: Family Medicine

## 2015-08-30 ENCOUNTER — Encounter: Payer: Self-pay | Admitting: Gastroenterology

## 2015-09-25 ENCOUNTER — Encounter: Payer: Self-pay | Admitting: Gastroenterology

## 2015-10-04 ENCOUNTER — Other Ambulatory Visit: Payer: Self-pay | Admitting: Physician Assistant

## 2015-10-04 DIAGNOSIS — G4700 Insomnia, unspecified: Secondary | ICD-10-CM

## 2015-10-04 NOTE — Telephone Encounter (Signed)
Approved. # [phone] + 0. 

## 2015-10-04 NOTE — Telephone Encounter (Signed)
LRF 06/01/15 #30  LOV 06/01/15  Ok refill?

## 2015-10-05 NOTE — Telephone Encounter (Signed)
Rx called in 

## 2015-11-01 ENCOUNTER — Ambulatory Visit (INDEPENDENT_AMBULATORY_CARE_PROVIDER_SITE_OTHER): Payer: Medicare Other | Admitting: Physician Assistant

## 2015-11-01 ENCOUNTER — Encounter: Payer: Self-pay | Admitting: Physician Assistant

## 2015-11-01 VITALS — BP 128/70 | HR 67 | Temp 97.7°F | Resp 16 | Wt 218.0 lb

## 2015-11-01 DIAGNOSIS — M25562 Pain in left knee: Secondary | ICD-10-CM

## 2015-11-01 MED ORDER — TRAMADOL HCL 50 MG PO TABS: ORAL_TABLET | ORAL | 0 refills | Status: DC

## 2015-11-01 NOTE — Progress Notes (Signed)
Patient ID: Hunter Porter MRN: [id number], DOB: [birth year], 70 y.o. Date of Encounter: 11/01/2015, 12:18 PM    Chief Complaint:  Chief Complaint  Patient presents with  . Knee Pain    left knee pain off and on for several months,     HPI: 70 y.o. year old white male presents With above.  Says that over the last several months he has had some problems with his left knee that it over the past one month it has been getting more frequent and more severe. Says that yesterday he played golf without any problems. However after that he got up to go get something and had a stabbing pain at the medial aspect of his left knee. Says that he is taking Naprosyn twice a day but some nights he can hardly sleep secondary to pain in that knee. Says if he is doing something for a very long time that requires him to stand or be active for a long than the knee bothers him.  Also asked if he can try some Viagra or Cialis. He does see a urologist for his prostate cancer but they told him to talk to me about this. Says that he has never used any type of medicine for ED. He is continuing to follow-up with urology.     Home Meds:   Outpatient Medications Prior to Visit  Medication Sig Dispense Refill  . ALPRAZolam (XANAX) 1 MG tablet TAKE 1/2 TABLET TWICE DAILY AS NEEDED. 30 tablet 0  . aspirin 81 MG tablet Take 81 mg by mouth daily.    . calcium carbonate 1250 MG capsule Take 1,250 mg by mouth daily.    Marland Kitchen EPINEPHrine 0.3 mg/0.3 mL IJ SOAJ injection Inject 0.3 mLs (0.3 mg total) into the muscle once. For allergic reaction to bee stings 1 Device 3  . magnesium 30 MG tablet Take 30 mg by mouth daily.    . Multiple Vitamin (MULTIVITAMIN) capsule Take 1 capsule by mouth daily.    . naproxen (NAPROSYN) 375 MG tablet TAKE 1 TABLET TWICE DAILY AS NEEDED FOR PAIN. TAKE WITH FOOD. 60 tablet 11  . NEXIUM 40 MG capsule TAKE ONE CAPSULE DAILY. 30 capsule 6  . Omega-3 Fatty Acids (FISH OIL) 1000 MG CAPS Take by  mouth.    . Potassium 99 MG TABS Take 1 tablet by mouth daily.      Marland Kitchen zinc gluconate 50 MG tablet Take 50 mg by mouth daily.    . predniSONE (DELTASONE) 20 MG tablet Take 3 daily for 2 days, then 2 daily for 2 days, then 1 daily for 2 days. (Patient not taking: Reported on 11/01/2015) 12 tablet 0   No facility-administered medications prior to visit.     Allergies:  Allergies  Allergen Reactions  . Hydrocodone-Acetaminophen Nausea And Vomiting  . Bee Venom   . Penicillins Rash      Review of Systems: See HPI for pertinent ROS. All other ROS negative.    Physical Exam: Blood pressure 128/70, pulse 67, temperature 97.7 F (36.5 C), temperature source Oral, resp. rate 16, weight 218 lb (98.9 kg)., Body mass index is 34.66 kg/m. General:  Obese WM. Appears in no acute distress. Neck: Supple. No thyromegaly. No lymphadenopathy. Lungs: Clear bilaterally to auscultation without wheezes, rales, or rhonchi. Breathing is unlabored. Heart: Regular rhythm. No murmurs, rubs, or gallops. Msk:  Strength and tone normal for age. Left Knee: There is some swelling superior to knee joint. Remainder of inspection is  normal. There is a focal area of tenderness with palpation at the medial joint line. Grind test positive. No laxity with varus or valgus maneuvers. Negative anterior drawer. Extremities/Skin: Warm and dry.  Neuro: Alert and oriented X 3. Moves all extremities spontaneously. Gait is normal. CNII-XII grossly in tact. Psych:  Responds to questions appropriately with a normal affect.     ASSESSMENT AND PLAN:  70 y.o. year old male with  1. Left knee pain A list includes hydrocodone. We'll continue Naprosyn twice a day. He will use tramadol at night for sleep. Refer to orthopedics. Suspect meniscal tear. - Ambulatory referral to Orthopedic Surgery - traMADol (ULTRAM) 50 MG tablet; Take 2 every night as needed for pain  Dispense: 60 tablet; Refill: 0   ED I am sure Urology has  checked Testosterone level and f/u that so will not check that to evaluate as cause of ED I gave him a sample pack of Viagra 100 mg tablet. Recommend he try half of a tablet and if this works can continue that dose. Call me for Rx once he tries this sample to knows effectiveness.  [address] [place], [place], [facility] 11/01/2015 12:18 PM

## 2015-11-08 ENCOUNTER — Ambulatory Visit (AMBULATORY_SURGERY_CENTER): Payer: Self-pay | Admitting: *Deleted

## 2015-11-08 VITALS — Ht 67.5 in | Wt 222.0 lb

## 2015-11-08 DIAGNOSIS — Z1211 Encounter for screening for malignant neoplasm of colon: Secondary | ICD-10-CM

## 2015-11-08 DIAGNOSIS — Z86010 Personal history of colonic polyps: Secondary | ICD-10-CM

## 2015-11-08 MED ORDER — NA SULFATE-K SULFATE-MG SULF 17.5-3.13-1.6 GM/177ML PO SOLN: 1.0000 | Freq: Once | ORAL | 0 refills | Status: AC

## 2015-11-08 NOTE — Progress Notes (Signed)
No egg or soy allergy known to patient  No issues with past sedation with any surgeries  or procedures, no intubation problems  No diet pills per patient No home 02 use per patient  No blood thinners per patient  Pt denies issues with constipation  No A fib or A flutter   emmi declined'   

## 2015-11-10 ENCOUNTER — Encounter: Payer: Self-pay | Admitting: Gastroenterology

## 2015-11-22 ENCOUNTER — Ambulatory Visit (AMBULATORY_SURGERY_CENTER): Payer: Medicare Other | Admitting: Gastroenterology

## 2015-11-22 ENCOUNTER — Encounter: Payer: Self-pay | Admitting: Gastroenterology

## 2015-11-22 VITALS — BP 121/78 | HR 57 | Temp 98.0°F | Resp 15 | Ht 67.5 in | Wt 222.0 lb

## 2015-11-22 DIAGNOSIS — Z86010 Personal history of colonic polyps: Secondary | ICD-10-CM

## 2015-11-22 DIAGNOSIS — D125 Benign neoplasm of sigmoid colon: Secondary | ICD-10-CM

## 2015-11-22 DIAGNOSIS — D124 Benign neoplasm of descending colon: Secondary | ICD-10-CM

## 2015-11-22 MED ORDER — SODIUM CHLORIDE 0.9 % IV SOLN: 500.0000 mL | INTRAVENOUS | Status: DC

## 2015-11-22 NOTE — Progress Notes (Signed)
Called to room to assist during endoscopic procedure.  Patient ID and intended procedure confirmed with present staff. Received instructions for my participation in the procedure from the performing physician.  

## 2015-11-22 NOTE — Op Note (Signed)
[facility] Patient Name: Hunter Porter Procedure Date: 11/22/2015 9:56 AM MRN: [id number] Endoscopist: Ladene Artist , MD Age: 70 Referring MD:  Date of Birth: [birth year] Gender: Male Account #: [id number] Procedure:                Colonoscopy Indications:              Surveillance: Personal history of adenomatous                            polyps on last colonoscopy > 5 years ago Medicines:                Monitored Anesthesia Care Procedure:                Pre-Anesthesia Assessment:                           - Prior to the procedure, a History and Physical                            was performed, and patient medications and                            allergies were reviewed. The patient's tolerance of                            previous anesthesia was also reviewed. The risks                            and benefits of the procedure and the sedation                            options and risks were discussed with the patient.                            All questions were answered, and informed consent                            was obtained. Prior Anticoagulants: The patient has                            taken no previous anticoagulant or antiplatelet                            agents. ASA Grade Assessment: II - A patient with                            mild systemic disease. After reviewing the risks                            and benefits, the patient was deemed in                            satisfactory condition to undergo the procedure.  After obtaining informed consent, the colonoscope                            was passed under direct vision. Throughout the                            procedure, the patient's blood pressure, pulse, and                            oxygen saturations were monitored continuously. The                            Model PCF-[id number]) scope was introduced                            through the anus and  advanced to the the cecum,                            identified by appendiceal orifice and ileocecal                            valve. The ileocecal valve, appendiceal orifice,                            and rectum were photographed. The quality of the                            bowel preparation was good. The colonoscopy was                            performed without difficulty. The patient tolerated                            the procedure well. Scope In: 10:02:55 AM Scope Out: 10:22:49 AM Scope Withdrawal Time: 0 hours 17 minutes 58 seconds  Total Procedure Duration: 0 hours 19 minutes 54 seconds  Findings:                 The perianal and digital rectal examinations were                            normal.                           13 sessile polyps were found in the sigmoid colon                            (8) and descending colon (5). The polyps were 4 to                            6 mm in size. These polyps were removed with a cold                            snare. Resection and retrieval were complete.  Internal hemorrhoids were found during                            retroflexion. The hemorrhoids were small and Grade                            I (internal hemorrhoids that do not prolapse).                           The exam was otherwise without abnormality on                            direct and retroflexion views. Complications:            No immediate complications. Estimated blood loss:                            None. Estimated Blood Loss:     Estimated blood loss: none. Impression:               - Thirteen 4 to 6 mm polyps in the sigmoid colon                            and in the descending colon, removed with a cold                            snare. Resected and retrieved.                           - Internal hemorrhoids. Recommendation:           - Repeat colonoscopy in 3 years for surveillance if                            3 or more  precancerous, otherwise 5 years.                           - Patient has a contact number available for                            emergencies. The signs and symptoms of potential                            delayed complications were discussed with the                            patient. Return to normal activities tomorrow.                            Written discharge instructions were provided to the                            patient.                           - Resume previous diet.                           -  Continue present medications.                           - Await pathology results. Ladene Artist, MD 11/22/2015 10:27:02 AM This report has been signed electronically.

## 2015-11-22 NOTE — Progress Notes (Signed)
  [facility] Post-op Note  Patient: Hunter Porter  Procedure(s) Performed: colonoscopy  Patient Location: [facility] - Recovery Area  Anesthesia Type: Deep Sedation/Propofol  Level of Consciousness: awake, oriented and patient cooperative  Airway and Oxygen Therapy: Patient Spontanous Breathing  Post-op Pain: none  Post-op Assessment:  Post-op Vital signs reviewed, Patient's Cardiovascular Status Stable, Respiratory Function Stable, Patent Airway, No signs of Nausea or vomiting and Pain level controlled  Post-op Vital Signs: Reviewed and stable  Complications: No apparent anesthesia complications  Charnae Lill E 10:29 AM

## 2015-11-22 NOTE — Progress Notes (Signed)
Had 1 cortisone injection into left knee and left shoulder on 11-20-15

## 2015-11-22 NOTE — Patient Instructions (Signed)
YOU HAD AN ENDOSCOPIC PROCEDURE TODAY AT THE [facility]:   Refer to the procedure report that was given to you for any specific questions about what was found during the examination.  If the procedure report does not answer your questions, please call your gastroenterologist to clarify.  If you requested that your care partner not be given the details of your procedure findings, then the procedure report has been included in a sealed envelope for you to review at your convenience later.  YOU SHOULD EXPECT: Some feelings of bloating in the abdomen. Passage of more gas than usual.  Walking can help get rid of the air that was put into your GI tract during the procedure and reduce the bloating. If you had a lower endoscopy (such as a colonoscopy or flexible sigmoidoscopy) you may notice spotting of blood in your stool or on the toilet paper. If you underwent a bowel prep for your procedure, you may not have a normal bowel movement for a few days.  Please Note:  You might notice some irritation and congestion in your nose or some drainage.  This is from the oxygen used during your procedure.  There is no need for concern and it should clear up in a day or so.  SYMPTOMS TO REPORT IMMEDIATELY:   Following lower endoscopy (colonoscopy or flexible sigmoidoscopy):  Excessive amounts of blood in the stool  Significant tenderness or worsening of abdominal pains  Swelling of the abdomen that is new, acute  Fever of 100F or higher    For urgent or emergent issues, a gastroenterologist can be reached at any hour by calling [phone].   DIET:  We do recommend a small meal at first, but then you may proceed to your regular diet.  Drink plenty of fluids but you should avoid alcoholic beverages for 24 hours.  ACTIVITY:  You should plan to take it easy for the rest of today and you should NOT DRIVE or use heavy machinery until tomorrow (because of the sedation medicines used during the test).     FOLLOW UP: Our staff will call the number listed on your records the next business day following your procedure to check on you and address any questions or concerns that you may have regarding the information given to you following your procedure. If we do not reach you, we will leave a message.  However, if you are feeling well and you are not experiencing any problems, there is no need to return our call.  We will assume that you have returned to your regular daily activities without incident.  If any biopsies were taken you will be contacted by phone or by letter within the next 1-3 weeks.  Please call us at [phone] if you have not heard about the biopsies in 3 weeks.    SIGNATURES/CONFIDENTIALITY: You and/or your care partner have signed paperwork which will be entered into your electronic medical record.  These signatures attest to the fact that that the information above on your After Visit Summary has been reviewed and is understood.  Full responsibility of the confidentiality of this discharge information lies with you and/or your care-partner.   Resume medications. Information given on polyps and hemorrhoids. 

## 2015-11-23 ENCOUNTER — Telehealth: Payer: Self-pay | Admitting: *Deleted

## 2015-11-23 ENCOUNTER — Telehealth: Payer: Self-pay

## 2015-11-23 NOTE — Telephone Encounter (Signed)
No answer. No identifier. Message left that we will call back later today.

## 2015-11-23 NOTE — Telephone Encounter (Signed)
  Follow up Call-  Call back number 11/22/2015  Post procedure Call Back phone  # [phone]  Permission to leave phone message Yes  Some recent data might be hidden     Patient questions:  Do you have a fever, pain , or abdominal swelling? No. Pain Score  0 *  Have you tolerated food without any problems? Yes.    Have you been able to return to your normal activities? Yes.    Do you have any questions about your discharge instructions: Diet   No. Medications  No. Follow up visit  No.  Do you have questions or concerns about your Care? No.  Actions: * If pain score is 4 or above: No action needed, pain <4.

## 2015-12-06 ENCOUNTER — Encounter: Payer: Self-pay | Admitting: Gastroenterology

## 2015-12-21 ENCOUNTER — Encounter: Payer: Self-pay | Admitting: Family Medicine

## 2015-12-22 ENCOUNTER — Telehealth: Payer: Self-pay

## 2015-12-22 NOTE — Telephone Encounter (Signed)
Lvm for pt to let him know his paperwork was ready

## 2015-12-26 ENCOUNTER — Telehealth: Payer: Self-pay | Admitting: Gastroenterology

## 2015-12-26 NOTE — Telephone Encounter (Signed)
Letter created and procedures printed.  He prefers to pick up the letter.  He will come at his convenience to pick this up.

## 2016-05-15 ENCOUNTER — Ambulatory Visit (INDEPENDENT_AMBULATORY_CARE_PROVIDER_SITE_OTHER): Payer: Medicare Other | Admitting: Physician Assistant

## 2016-05-15 ENCOUNTER — Encounter: Payer: Self-pay | Admitting: Physician Assistant

## 2016-05-15 DIAGNOSIS — R600 Localized edema: Secondary | ICD-10-CM

## 2016-05-15 MED ORDER — HYDROCHLOROTHIAZIDE 25 MG PO TABS: 25.0000 mg | ORAL_TABLET | Freq: Every day | ORAL | 3 refills | Status: DC

## 2016-05-15 NOTE — Progress Notes (Signed)
Patient ID: Hunter Porter MRN: [id number], DOB: 01-Dec-1945, 71 y.o. Date of Encounter: 05/15/2016, 11:48 AM    Chief Complaint:  Chief Complaint  Patient presents with  . left foot pain    stepped on glass   . some  right foot pain    x59month     HPI: 71 y.o. year old male presents with above.   Says that the [facility] has been prescribing gabapentin for neuropathy. Says he noticed that the swelling started after he started gabapentin and so he is wondering whether the gabapentin may be causing the swelling. Also notes that the gabapentin makes him feel tired and sleepy and is thinking about weaning down on that dose or stopping it.  Also notes that he "has a bad knee too " and "got a shot in his left knee--it didn't help" and doesn't know if that may all be affecting the swelling as well.  Regarding the note above in chief complaint-- about the left foot-- he says that about a month ago he stepped on some glass. Says that our office was booked up so he went to urgent care. Says they did an x-ray. Says they prescribed doxycycline which he has taken as directed. Says that that site has healed and resolving.  Now has been noticing some swelling in both lower legs. Wondering if any of the above information was contributing to this swelling so wanted me to be aware of all of that.     Home Meds:   Outpatient Medications Prior to Visit  Medication Sig Dispense Refill  . ALPRAZolam (XANAX) 1 MG tablet TAKE 1/2 TABLET TWICE DAILY AS NEEDED. 30 tablet 0  . aspirin 81 MG tablet Take 81 mg by mouth daily.    . calcium carbonate 1250 MG capsule Take 1,250 mg by mouth daily.    . cholecalciferol (VITAMIN D) 1000 units tablet Take 1,000 Units by mouth daily.    . CHONDROITIN SULFATE A PO Take 1,200 mg by mouth 2 (two) times daily.    Marland Kitchen EPINEPHrine 0.3 mg/0.3 mL IJ SOAJ injection Inject 0.3 mLs (0.3 mg total) into the muscle once. For allergic reaction to bee stings 1 Device 3  .  glucosamine-chondroitin 500-400 MG tablet Take 1 tablet by mouth 2 (two) times daily.    . magnesium 30 MG tablet Take 30 mg by mouth daily.    . Multiple Vitamin (MULTIVITAMIN) capsule Take 1 capsule by mouth daily.    . naproxen (NAPROSYN) 375 MG tablet TAKE 1 TABLET TWICE DAILY AS NEEDED FOR PAIN. TAKE WITH FOOD. 60 tablet 11  . NEXIUM 40 MG capsule TAKE ONE CAPSULE DAILY. 30 capsule 6  . Potassium 99 MG TABS Take 1 tablet by mouth daily.      . traMADol (ULTRAM) 50 MG tablet Take 2 every night as needed for pain 60 tablet 0  . zinc gluconate 50 MG tablet Take 50 mg by mouth daily.     Facility-Administered Medications Prior to Visit  Medication Dose Route Frequency Provider Last Rate Last Dose  . 0.9 %  sodium chloride infusion  500 mL Intravenous Continuous Ladene Artist, MD        Allergies:  Allergies  Allergen Reactions  . Hydrocodone-Acetaminophen Nausea And Vomiting  . Bee Venom   . Penicillins Rash      Review of Systems: See HPI for pertinent ROS. All other ROS negative.    Physical Exam: Blood pressure (!) 162/90, pulse 65, temperature 97.7  F (36.5 C), temperature source Oral, resp. rate 14, weight 233 lb 12.8 oz (106.1 kg), SpO2 97 %., Body mass index is 36.08 kg/m. General:  WM. Appears in no acute distress. Neck: Supple. No thyromegaly. No lymphadenopathy. Lungs: Clear bilaterally to auscultation without wheezes, rales, or rhonchi. Breathing is unlabored. Heart: Regular rhythm. No murmurs, rubs, or gallops. Msk:  Strength and tone normal for age. He has no pitting edema of feet. He has 1+ pedal edema. No pitting edema further up legs. Extremities/Skin: Warm and dry. Neuro: Alert and oriented X 3. Moves all extremities spontaneously. Gait is normal. CNII-XII grossly in tact. Psych:  Responds to questions appropriately with a normal affect.     ASSESSMENT AND PLAN:  71 y.o. year old male with  1. Lower extremity edema Discussed with him that I'm not sure  that the gabapentin is what is causing this swelling. However, after further discussion, it seems that he has decided he is definitely going to at least decrease the dosing because it is making him feel drowsy. Discussed it is okay for him to adjust this dosing based on the swelling and the drowsiness. As well he says that he thinks it may be helping the neuropathy just a little bit but not much. I discussed that he is on a low dose and that if he weren't having side effects and were just trying to control the neuropathy, this does come in a higher dose and we discussed all of that. We also discussed that his blood pressure is reading high today but at prior visits it was not high at all. Have him take HCTZ 25 mg daily for a few days then can just use prn swelling.  Will have him come in for follow-up visit in 2 weeks to reassess the swelling and recheck his blood pressure and also discussed very is regarding the gabapentin dosing. - hydrochlorothiazide (HYDRODIURIL) 25 MG tablet; Take 1 tablet (25 mg total) by mouth daily.  Dispense: 30 tablet; Refill: 3   Signed, [address] [place], [place], [facility] 05/15/2016 11:48 AM

## 2016-05-29 ENCOUNTER — Encounter: Payer: Self-pay | Admitting: Physician Assistant

## 2016-05-29 ENCOUNTER — Ambulatory Visit (INDEPENDENT_AMBULATORY_CARE_PROVIDER_SITE_OTHER): Payer: Medicare Other | Admitting: Physician Assistant

## 2016-05-29 VITALS — BP 130/80 | HR 74 | Temp 98.3°F | Resp 16 | Ht 68.0 in | Wt 224.2 lb

## 2016-05-29 DIAGNOSIS — R600 Localized edema: Secondary | ICD-10-CM | POA: Diagnosis not present

## 2016-05-29 DIAGNOSIS — I10 Essential (primary) hypertension: Secondary | ICD-10-CM | POA: Diagnosis not present

## 2016-05-29 LAB — BASIC METABOLIC PANEL WITH GFR
BUN: 16 mg/dL (ref 7–25)
CHLORIDE: 97 mmol/L — AB (ref 98–110)
CO2: 27 mmol/L (ref 20–31)
Calcium: 9.4 mg/dL (ref 8.6–10.3)
Creat: 0.87 mg/dL (ref 0.70–1.18)
GFR, EST NON AFRICAN AMERICAN: 87 mL/min (ref >=60)
GFR, Est African American: >89 mL/min (ref >=60)
Glucose, Bld: 101 mg/dL — ABNORMAL HIGH (ref 70–99)
POTASSIUM: 4 mmol/L (ref 3.5–5.3)
SODIUM: 136 mmol/L (ref 135–146)

## 2016-05-29 MED ORDER — HYDROCHLOROTHIAZIDE 25 MG PO TABS: 25.0000 mg | ORAL_TABLET | Freq: Every day | ORAL | 3 refills | Status: DC

## 2016-05-29 NOTE — Progress Notes (Signed)
Patient ID: Hunter Porter MRN: [id number], DOB: [birth year], 71 y.o. Date of Encounter: 05/29/2016, 11:55 AM    Chief Complaint:  Chief Complaint  Patient presents with  . Hypertension    f/u  . edema in left leg     HPI: 71 y.o. year old male presents with above.   05/15/2016:  [facility]. Says he noticed that the swelling started after he started gabapentin and so he is wondering whether the gabapentin may be causing the swelling. Also notes that the gabapentin makes him feel tired and sleepy and is thinking about weaning down on that dose or stopping it.  Also notes that he "has a bad knee too " and "got a shot in his left knee--it didn't help" and doesn't know if that may all be affecting the swelling as well.  Regarding the note above in chief complaint-- about the left foot-- he says that about a month ago he stepped on some glass. Says that our office was booked up so he went to urgent care. Says they did an x-ray. Says they prescribed doxycycline which he has taken as directed. Says that that site has healed and resolving.  Now has been noticing some swelling in both lower legs. Wondering if any of the above information was contributing to this swelling so wanted me to be aware of all of that.   1. Lower extremity edema Discussed with him that I'm not sure that the gabapentin is what is causing this swelling. However, after further discussion, it seems that he has decided he is definitely going to at least decrease the dosing because it is making him feel drowsy. Discussed it is okay for him to adjust this dosing based on the swelling and the drowsiness. As well he says that he thinks it may be helping the neuropathy just a little bit but not much. I discussed that he is on a low dose and that if he weren't having side effects and were just trying to control the neuropathy, this does come in a higher dose and we discussed  all of that. We also discussed that his blood pressure is reading high today but at prior visits it was not high at all. Have him take HCTZ 25 mg daily for a few days then can just use prn swelling.  Will have him come in for follow-up visit in 2 weeks to reassess the swelling and recheck his blood pressure and also discussed very is regarding the gabapentin dosing. - hydrochlorothiazide (HYDRODIURIL) 25 MG tablet; Take 1 tablet (25 mg total) by mouth daily.  Dispense: 30 tablet; Refill: 3    05/29/2016: He reports that since last visit he has decreased the gabapentin from taking one 3 times daily down to currently taking one twice daily. The drowsiness is better. Has been taking the HCTZ every day. Swelling is decreased and pretty much resolved and back to normal. He does not eat much pork or other and in general eats a low-sodium diet. Noted that blood pressure is good today and it is 130/80.   Home Meds:   Outpatient Medications Prior to Visit  Medication Sig Dispense Refill  . ALPRAZolam (XANAX) 1 MG tablet TAKE 1/2 TABLET TWICE DAILY AS NEEDED. 30 tablet 0  . aspirin 81 MG tablet Take 81 mg by mouth daily.    . calcium carbonate 1250 MG capsule Take 1,250 mg by mouth daily.    . cholecalciferol (  VITAMIN D) 1000 units tablet Take 1,000 Units by mouth daily.    . CHONDROITIN SULFATE A PO Take 1,200 mg by mouth 2 (two) times daily.    Marland Kitchen EPINEPHrine 0.3 mg/0.3 mL IJ SOAJ injection Inject 0.3 mLs (0.3 mg total) into the muscle once. For allergic reaction to bee stings 1 Device 3  . gabapentin (NEURONTIN) 100 MG capsule Take 100 mg by mouth 3 (three) times daily.    Marland Kitchen glucosamine-chondroitin 500-400 MG tablet Take 1 tablet by mouth 2 (two) times daily.    . hydrochlorothiazide (HYDRODIURIL) 25 MG tablet Take 1 tablet (25 mg total) by mouth daily. 30 tablet 3  . magnesium 30 MG tablet Take 30 mg by mouth daily.    . Multiple Vitamin (MULTIVITAMIN) capsule Take 1 capsule by mouth daily.    .  naproxen (NAPROSYN) 375 MG tablet TAKE 1 TABLET TWICE DAILY AS NEEDED FOR PAIN. TAKE WITH FOOD. 60 tablet 11  . NEXIUM 40 MG capsule TAKE ONE CAPSULE DAILY. 30 capsule 6  . Potassium 99 MG TABS Take 1 tablet by mouth daily.      . traMADol (ULTRAM) 50 MG tablet Take 2 every night as needed for pain 60 tablet 0  . zinc gluconate 50 MG tablet Take 50 mg by mouth daily.     Facility-Administered Medications Prior to Visit  Medication Dose Route Frequency Provider Last Rate Last Dose  . 0.9 %  sodium chloride infusion  500 mL Intravenous Continuous Ladene Artist, MD        Allergies:  Allergies  Allergen Reactions  . Hydrocodone-Acetaminophen Nausea And Vomiting  . Bee Venom   . Penicillins Rash      Review of Systems: See HPI for pertinent ROS. All other ROS negative.    Physical Exam: Blood pressure 130/80, pulse 74, temperature 98.3 F (36.8 C), temperature source Oral, resp. rate 16, height 5\' 8"  (1.727 m), weight 224 lb 3.2 oz (101.7 kg), SpO2 97 %., Body mass index is 34.09 kg/m. General:  WM. Appears in no acute distress. Neck: Supple. No thyromegaly. No lymphadenopathy. Lungs: Clear bilaterally to auscultation without wheezes, rales, or rhonchi. Breathing is unlabored. Heart: Regular rhythm. No murmurs, rubs, or gallops. Msk:  Strength and tone normal for age. No LE edema on exam today. Extremities/Skin: Warm and dry. Neuro: Alert and oriented X 3. Moves all extremities spontaneously. Gait is normal. CNII-XII grossly in tact. Psych:  Responds to questions appropriately with a normal affect.     ASSESSMENT AND PLAN:  71 y.o. year old male with    1. Lower extremity edema Continue the gabapentin one twice a day. Will continue HCTZ one every morning. We'll check lab to monitor. - BASIC METABOLIC PANEL WITH GFR - hydrochlorothiazide (HYDRODIURIL) 25 MG tablet; Take 1 tablet (25 mg total) by mouth daily.  Dispense: 30 tablet; Refill: 3  2. Essential hypertension Blood  Pressure is well controlled at goal. Continue the HCTZ in addition to other medicines. Check lab to monitor. - BASIC METABOLIC PANEL WITH GFR - hydrochlorothiazide (HYDRODIURIL) 25 MG tablet; Take 1 tablet (25 mg total) by mouth daily.  Dispense: 30 tablet; Refill: 3      Signed, [address] [place], [place], [facility] 05/29/2016 11:55 AM

## 2016-06-17 ENCOUNTER — Other Ambulatory Visit: Payer: Self-pay | Admitting: Physician Assistant

## 2016-06-17 NOTE — Telephone Encounter (Signed)
Refill appropriate 

## 2016-07-05 ENCOUNTER — Encounter: Payer: Self-pay | Admitting: Family Medicine

## 2016-07-05 ENCOUNTER — Ambulatory Visit (INDEPENDENT_AMBULATORY_CARE_PROVIDER_SITE_OTHER): Payer: Medicare Other | Admitting: Family Medicine

## 2016-07-05 VITALS — BP 108/62 | HR 64 | Temp 98.5°F | Resp 18 | Ht 68.0 in | Wt 218.0 lb

## 2016-07-05 DIAGNOSIS — R52 Pain, unspecified: Secondary | ICD-10-CM

## 2016-07-05 MED ORDER — DOXYCYCLINE HYCLATE 100 MG PO TABS
100.0000 mg | ORAL_TABLET | Freq: Two times a day (BID) | ORAL | 0 refills | Status: DC
Start: 2016-07-05 — End: 2016-08-21

## 2016-07-05 NOTE — Progress Notes (Signed)
Subjective:    Patient ID: Hunter Porter, male    DOB: 11/26/1945, 71 y.o.   MRN: [id number]  HPI Patient is here today reporting 2 weeks of diffuse body aches and severe fatigue. He describes it as flulike symptoms. They began approximately 2 weeks ago without provocation. He states that he felt soreness and pain even in his scalp. It "hurt to touch my hair". He has been sleeping 12 hours a day with very little relief. He reports extreme fatigue. He denies any fever but the body aches or not improving. He denies any recent tick bite. He denies any rash. He does report some pressure in his for head and a dull headache around his frontal sinus. He denies any cough. He denies any shortness of breath. Past medical history significant for smoking but he denies any hemoptysis or chest pain. Past medical history is also significant for prostate cancer but he denies any specific bone pain. He denies any rash. He denies any abdominal pain. He denies any nausea vomiting or diarrhea. He denies any melena or hematochezia.  He also denies any hematuria or dysuria or rectal pain Past Medical History:  Diagnosis Date  . Adenomatous colon polyp   . Allergy   . Anxiety   . Arthritis   . Cataract    removed bilateral  . GERD (gastroesophageal reflux disease)   . H/O hemorrhoids   . H/O hiatal hernia   . Heart murmur 2007   per pt history- possible murmur by PCp- states negative stress test  . Hyperlipidemia   . Neuromuscular disorder ([place])    [facility]  . Prostate cancer ([place]) 01/20/2013  . Sleep apnea 2007   no followup per pt following study-no cpap  . Substance abuse   . Thyroid disease    graves  . Ulcer approx 1970   HX peptic ulcer   Past Surgical History:  Procedure Laterality Date  . CATARACT EXTRACTION W/ INTRAOCULAR LENS  IMPLANT, BILATERAL    . COLONOSCOPY    . LYMPHADENECTOMY Bilateral 03/17/2013   Procedure: LYMPHADENECTOMY PELVIC LYMPH NODE DISSECTION;  Surgeon: [name], MD;   Location: [facility];  Service: Urology;  Laterality: Bilateral;  . POLYPECTOMY    . PROSTATE BIOPSY    . ROBOT ASSISTED LAPAROSCOPIC RADICAL PROSTATECTOMY N/A 03/17/2013   Procedure: ROBOTIC ASSISTED LAPAROSCOPIC RADICAL PROSTATECTOMY;  Surgeon: [name], MD;  Location: [facility];  Service: Urology;  Laterality: N/A;  . TONSILLECTOMY    . TRANSURETHRAL RESECTION OF PROSTATE  12/01/2012   Dr Risa Grill  . UPPER GASTROINTESTINAL ENDOSCOPY     Current Outpatient Prescriptions on File Prior to Visit  Medication Sig Dispense Refill  . ALPRAZolam (XANAX) 1 MG tablet TAKE 1/2 TABLET TWICE DAILY AS NEEDED. 30 tablet 0  . aspirin 81 MG tablet Take 81 mg by mouth daily.    . calcium carbonate 1250 MG capsule Take 1,250 mg by mouth daily.    . cholecalciferol (VITAMIN D) 1000 units tablet Take 1,000 Units by mouth daily.    . CHONDROITIN SULFATE A PO Take 1,200 mg by mouth 2 (two) times daily.    Marland Kitchen EPINEPHrine 0.3 mg/0.3 mL IJ SOAJ injection Inject 0.3 mLs (0.3 mg total) into the muscle once. For allergic reaction to bee stings 1 Device 3  . gabapentin (NEURONTIN) 100 MG capsule Take 100 mg by mouth 3 (three) times daily.    Marland Kitchen glucosamine-chondroitin 500-400 MG tablet Take 1 tablet by mouth 2 (two) times daily.    Marland Kitchen  hydrochlorothiazide (HYDRODIURIL) 25 MG tablet Take 1 tablet (25 mg total) by mouth daily. 30 tablet 3  . magnesium 30 MG tablet Take 30 mg by mouth daily.    . Multiple Vitamin (MULTIVITAMIN) capsule Take 1 capsule by mouth daily.    . naproxen (NAPROSYN) 375 MG tablet TAKE 1 TABLET TWICE DAILY AS NEEDED FOR PAIN.TAKE WITH FOOD. 60 tablet 2  . NEXIUM 40 MG capsule TAKE ONE CAPSULE DAILY. 30 capsule 6  . Potassium 99 MG TABS Take 1 tablet by mouth daily.      . traMADol (ULTRAM) 50 MG tablet Take 2 every night as needed for pain 60 tablet 0  . zinc gluconate 50 MG tablet Take 50 mg by mouth daily.     Current Facility-Administered Medications on File Prior to Visit  Medication Dose Route  Frequency Provider Last Rate Last Dose  . 0.9 %  sodium chloride infusion  500 mL Intravenous Continuous Ladene Artist, MD       Allergies  Allergen Reactions  . Hydrocodone-Acetaminophen Nausea And Vomiting  . Bee Venom   . Penicillins Rash   Social History   Social History  . Marital status: Single    Spouse name: N/A  . Number of children: N/A  . Years of education: N/A   Occupational History  . Not on file.   Social History Main Topics  . Smoking status: Current Every Day Smoker    Packs/day: 1.00    Types: Cigarettes  . Smokeless tobacco: Never Used  . Alcohol use 14.4 oz/week    24 Cans of beer per week     Comment: 24 cans beer week  . Drug use: No  . Sexual activity: Not Currently   Other Topics Concern  . Not on file   Social History Narrative   Retired.    Plays golf 4 days/week.   Works in yard.    Drinks 6 beers each evening.      Review of Systems  All other systems reviewed and are negative.      Objective:   Physical Exam  Constitutional: He is oriented to person, place, and time. He appears well-developed and well-nourished. No distress.  HENT:  Right Ear: External ear normal.  Left Ear: External ear normal.  Nose: Nose normal.  Mouth/Throat: Oropharynx is clear and moist. No oropharyngeal exudate.  Eyes: Conjunctivae are normal. No scleral icterus.  Neck: Neck supple. No thyromegaly present.  Cardiovascular: Normal rate, regular rhythm and normal heart sounds.   No murmur heard. Pulmonary/Chest: Effort normal and breath sounds normal. No respiratory distress. He has no wheezes. He has no rales.  Abdominal: Soft. Bowel sounds are normal. He exhibits no distension. There is no tenderness. There is no rebound and no guarding.  Musculoskeletal: He exhibits no edema, tenderness or deformity.  Lymphadenopathy:    He has no cervical adenopathy.  Neurological: He is alert and oriented to person, place, and time. He has normal reflexes. He  exhibits normal muscle tone. Coordination normal.  Skin: No rash noted. He is not diaphoretic. No erythema.  Vitals reviewed.         Assessment & Plan:  Body aches - Plan: CBC with Differential/Platelet, COMPLETE METABOLIC PANEL WITH GFR, Sedimentation rate, B. burgdorfi antibodies by WB, doxycycline (VIBRA-TABS) 100 MG tablet  Differential diagnosis includes viral infection similar to the flu, tickborne illness such as Lyme disease, atypical sinusitis given the location of his headache, atypical prostatitis given his age. Certainly for smoker  pneumonia is on the differential diagnosis although he is completely asymptomatic. Begin by covering for possible tickborne illnesses and prostatitis with doxycycline 100 mg by mouth twice a day for 10 days. Meanwhile check a CBC, CMP, sedimentation rate, and Lyme titer. Recheck next week if symptoms are not improving, extend diagnostic workup.

## 2016-07-06 LAB — CBC WITH DIFFERENTIAL/PLATELET
BASOS ABS: 0 {cells}/uL (ref 0–200)
Basophils Relative: 0 %
Eosinophils Absolute: 105 cells/uL (ref 15–500)
Eosinophils Relative: 1 %
HEMATOCRIT: 40.4 % (ref 38.5–50.0)
HEMOGLOBIN: 13.7 g/dL (ref 13.0–17.0)
LYMPHS ABS: 945 {cells}/uL (ref 850–3900)
Lymphocytes Relative: 9 %
MCH: 31.6 pg (ref 27.0–33.0)
MCHC: 33.9 g/dL (ref 32.0–36.0)
MCV: 93.3 fL (ref 80.0–100.0)
MONO ABS: 840 {cells}/uL (ref 200–950)
MPV: 9.3 fL (ref 7.5–12.5)
Monocytes Relative: 8 %
NEUTROS PCT: 82 %
Neutro Abs: 8610 cells/uL — ABNORMAL HIGH (ref 1500–7800)
Platelets: 259 10*3/uL (ref 140–400)
RBC: 4.33 MIL/uL (ref 4.20–5.80)
RDW: 12.8 % (ref 11.0–15.0)
WBC: 10.5 10*3/uL (ref 3.8–10.8)

## 2016-07-06 LAB — COMPLETE METABOLIC PANEL WITH GFR
ALBUMIN: 3.9 g/dL (ref 3.6–5.1)
ALK PHOS: 86 U/L (ref 40–115)
ALT: 21 U/L (ref 9–46)
AST: 19 U/L (ref 10–35)
BILIRUBIN TOTAL: 0.7 mg/dL (ref 0.2–1.2)
BUN: 14 mg/dL (ref 7–25)
CALCIUM: 8.8 mg/dL (ref 8.6–10.3)
CO2: 31 mmol/L (ref 20–31)
CREATININE: 0.87 mg/dL (ref 0.70–1.18)
Chloride: 94 mmol/L — ABNORMAL LOW (ref 98–110)
GFR, Est African American: >89 mL/min (ref >=60)
GFR, Est Non African American: 87 mL/min (ref >=60)
Glucose, Bld: 99 mg/dL (ref 70–99)
POTASSIUM: 3.6 mmol/L (ref 3.5–5.3)
Sodium: 135 mmol/L (ref 135–146)
TOTAL PROTEIN: 6.9 g/dL (ref 6.1–8.1)

## 2016-07-06 LAB — SEDIMENTATION RATE: SED RATE: 82 mm/h — AB (ref 0–20)

## 2016-07-18 LAB — LYME ABY, WSTRN BLT IGG & IGM W/BANDS
B burgdorferi IgG Abs (IB): NEGATIVE
B burgdorferi IgM Abs (IB): NEGATIVE
LYME DISEASE 23 KD IGM: NONREACTIVE
LYME DISEASE 28 KD IGG: NONREACTIVE
LYME DISEASE 39 KD IGM: NONREACTIVE
LYME DISEASE 41 KD IGG: REACTIVE — AB
LYME DISEASE 41 KD IGM: NONREACTIVE
LYME DISEASE 45 KD IGG: NONREACTIVE
LYME DISEASE 58 KD IGG: NONREACTIVE
LYME DISEASE 66 KD IGG: NONREACTIVE
LYME DISEASE 93 KD IGG: NONREACTIVE
Lyme Disease 18 kD IgG: NONREACTIVE
Lyme Disease 23 kD IgG: NONREACTIVE
Lyme Disease 30 kD IgG: NONREACTIVE
Lyme Disease 39 kD IgG: NONREACTIVE

## 2016-08-19 ENCOUNTER — Ambulatory Visit: Payer: Medicare Other | Admitting: Physician Assistant

## 2016-08-21 ENCOUNTER — Encounter: Payer: Self-pay | Admitting: Physician Assistant

## 2016-08-21 ENCOUNTER — Ambulatory Visit (INDEPENDENT_AMBULATORY_CARE_PROVIDER_SITE_OTHER): Payer: Medicare Other | Admitting: Physician Assistant

## 2016-08-21 ENCOUNTER — Other Ambulatory Visit: Payer: Self-pay | Admitting: Physician Assistant

## 2016-08-21 VITALS — BP 130/80 | HR 67 | Temp 97.6°F | Resp 16 | Wt 216.8 lb

## 2016-08-21 DIAGNOSIS — Z0000 Encounter for general adult medical examination without abnormal findings: Secondary | ICD-10-CM

## 2016-08-21 DIAGNOSIS — G4733 Obstructive sleep apnea (adult) (pediatric): Secondary | ICD-10-CM

## 2016-08-21 DIAGNOSIS — M25562 Pain in left knee: Secondary | ICD-10-CM

## 2016-08-21 DIAGNOSIS — R9431 Abnormal electrocardiogram [ECG] [EKG]: Secondary | ICD-10-CM

## 2016-08-21 DIAGNOSIS — G8929 Other chronic pain: Secondary | ICD-10-CM

## 2016-08-21 DIAGNOSIS — I10 Essential (primary) hypertension: Secondary | ICD-10-CM | POA: Diagnosis not present

## 2016-08-21 DIAGNOSIS — Z9989 Dependence on other enabling machines and devices: Secondary | ICD-10-CM | POA: Diagnosis not present

## 2016-08-21 DIAGNOSIS — K439 Ventral hernia without obstruction or gangrene: Secondary | ICD-10-CM | POA: Diagnosis not present

## 2016-08-21 DIAGNOSIS — C61 Malignant neoplasm of prostate: Secondary | ICD-10-CM | POA: Diagnosis not present

## 2016-08-21 DIAGNOSIS — Z01810 Encounter for preprocedural cardiovascular examination: Secondary | ICD-10-CM | POA: Diagnosis not present

## 2016-08-21 LAB — CBC WITH DIFFERENTIAL/PLATELET
Basophils Absolute: 59 cells/uL (ref 0–200)
Basophils Relative: 1 %
EOS ABS: 236 {cells}/uL (ref 15–500)
Eosinophils Relative: 4 %
HEMATOCRIT: 43.4 % (ref 38.5–50.0)
HEMOGLOBIN: 14.7 g/dL (ref 13.0–17.0)
LYMPHS ABS: 1003 {cells}/uL (ref 850–3900)
Lymphocytes Relative: 17 %
MCH: 32.5 pg (ref 27.0–33.0)
MCHC: 33.9 g/dL (ref 32.0–36.0)
MCV: 96 fL (ref 80.0–100.0)
MPV: 9 fL (ref 7.5–12.5)
Monocytes Absolute: 413 cells/uL (ref 200–950)
Monocytes Relative: 7 %
NEUTROS ABS: 4189 {cells}/uL (ref 1500–7800)
Neutrophils Relative %: 71 %
Platelets: 202 10*3/uL (ref 140–400)
RBC: 4.52 MIL/uL (ref 4.20–5.80)
RDW: 15.3 % — AB (ref 11.0–15.0)
WBC: 5.9 10*3/uL (ref 3.8–10.8)

## 2016-08-21 LAB — TSH: TSH: 2.14 m[IU]/L (ref 0.40–4.50)

## 2016-08-21 MED ORDER — TRAMADOL HCL 50 MG PO TABS: ORAL_TABLET | ORAL | 0 refills | Status: DC

## 2016-08-21 NOTE — Telephone Encounter (Signed)
Refill appropriate 

## 2016-08-21 NOTE — Progress Notes (Addendum)
Patient ID: Hunter Porter MRN: [id number], DOB: 12-Jun-1945 71 y.o. Date of Encounter: 08/21/2016, 12:00 PM    Chief Complaint: Physical (CPE)  HPI: 71 y.o. y/o white male here for CPE.   I recently received a preop clearance from [facility] in preparation for upcoming knee replacement surgery. Patient was notified that he needed to come in for office visit and preop evaluation so presents for that today.  He states that even with physical exertion he has been having no chest pressure chest heaviness chest tightness. Has noticed no increase in his shortness of breath.  Says he has been undergoing treatments for prostate cancer but says he has completed those. Says his last PSA level was 0 and his next follow-up is 6 months which she says is due in October.  ADDENDUM ADDED---RECEIVED RECORDS FROM THE V[facility] IN [place]: V.A. Records include: Ultrasound to screen for abdominal aortic aneurysm was performed 01/25/2016 and was negative with no AAA. Screening CT scan of lungs performed 02/28/2016 showed benign appearance. Lab results from 01/03/16 --- Cholesterol 191, triglyceride 178, HDL 70, LDL 85.      A1c 5.4.    CBC normal with WBC 6.69, hemoglobin 14.6, hematocrit 41.4, platelet 211.   Bmet normal. LFTs normal.  Vitamin B12 was 292 and folate 10.0. Vitamin D was 27.  Review of Systems: Consitutional: No fever, chills, fatigue, night sweats, lymphadenopathy, or weight changes. Eyes: No visual changes, eye redness, or discharge. ENT/Mouth: Ears: No otalgia, tinnitus, hearing loss, discharge. Nose: No congestion, rhinorrhea, sinus pain, or epistaxis. Throat: No sore throat, post nasal drip, or teeth pain. Cardiovascular: No CP, palpitations, diaphoresis, DOE, edema, orthopnea, PND. Respiratory: No cough, hemoptysis, SOB, or wheezing. Gastrointestinal: No anorexia, dysphagia, reflux, pain, nausea, vomiting, hematemesis, diarrhea, constipation, BRBPR, or  melena. Genitourinary: No dysuria, frequency, urgency, hematuria, incontinence, nocturia, decreased urinary stream, discharge, impotence, or testicular pain/masses. Musculoskeletal: No decreased ROM, myalgias, stiffness, joint swelling, or weakness. Skin: No rash, erythema, lesion changes, pain, warmth, jaundice, or pruritis. Neurological: No headache, dizziness, syncope, seizures, tremors, memory loss, coordination problems, or paresthesias. Psychological: No anxiety, depression, hallucinations, SI/HI. Endocrine: No fatigue, polydipsia, polyphagia, polyuria, or known diabetes. All other systems were reviewed and are otherwise negative.  Past Medical History:  Diagnosis Date  . Adenomatous colon polyp   . Allergy   . Anxiety   . Arthritis   . Cataract    removed bilateral  . GERD (gastroesophageal reflux disease)   . H/O hemorrhoids   . H/O hiatal hernia   . Heart murmur 2007   per pt history- possible murmur by PCp- states negative stress test  . Hyperlipidemia   . Neuromuscular disorder ([place])    HH  . Prostate cancer ([place]) 01/20/2013  . Sleep apnea 2007   no followup per pt following study-no cpap  . Substance abuse   . Thyroid disease    graves  . Ulcer approx 1970   HX peptic ulcer     Past Surgical History:  Procedure Laterality Date  . CATARACT EXTRACTION W/ INTRAOCULAR LENS  IMPLANT, BILATERAL    . COLONOSCOPY    . LYMPHADENECTOMY Bilateral 03/17/2013   Procedure: LYMPHADENECTOMY PELVIC LYMPH NODE DISSECTION;  Surgeon: [name], MD;  Location: [facility];  Service: Urology;  Laterality: Bilateral;  . POLYPECTOMY    . PROSTATE BIOPSY    . ROBOT ASSISTED LAPAROSCOPIC RADICAL PROSTATECTOMY N/A 03/17/2013   Procedure: ROBOTIC ASSISTED LAPAROSCOPIC RADICAL PROSTATECTOMY;  Surgeon: [name], MD;  Location: [facility];  Service: Urology;  Laterality: N/A;  . TONSILLECTOMY    . TRANSURETHRAL RESECTION OF PROSTATE  12/01/2012   Dr Risa Grill  . UPPER GASTROINTESTINAL  ENDOSCOPY      Home Meds:  Outpatient Medications Prior to Visit  Medication Sig Dispense Refill  . ALPRAZolam (XANAX) 1 MG tablet TAKE 1/2 TABLET TWICE DAILY AS NEEDED. 30 tablet 0  . aspirin 81 MG tablet Take 81 mg by mouth daily.    . calcium carbonate 1250 MG capsule Take 1,250 mg by mouth daily.    . cholecalciferol (VITAMIN D) 1000 units tablet Take 1,000 Units by mouth daily.    . CHONDROITIN SULFATE A PO Take 1,200 mg by mouth 2 (two) times daily.    Marland Kitchen EPINEPHrine 0.3 mg/0.3 mL IJ SOAJ injection Inject 0.3 mLs (0.3 mg total) into the muscle once. For allergic reaction to bee stings 1 Device 3  . glucosamine-chondroitin 500-400 MG tablet Take 1 tablet by mouth 2 (two) times daily.    . hydrochlorothiazide (HYDRODIURIL) 25 MG tablet Take 1 tablet (25 mg total) by mouth daily. 30 tablet 3  . magnesium 30 MG tablet Take 30 mg by mouth daily.    . Multiple Vitamin (MULTIVITAMIN) capsule Take 1 capsule by mouth daily.    . naproxen (NAPROSYN) 375 MG tablet TAKE 1 TABLET TWICE DAILY AS NEEDED FOR PAIN.TAKE WITH FOOD. 60 tablet 2  . NEXIUM 40 MG capsule TAKE ONE CAPSULE DAILY. 30 capsule 6  . Potassium 99 MG TABS Take 1 tablet by mouth daily.      Marland Kitchen zinc gluconate 50 MG tablet Take 50 mg by mouth daily.    Marland Kitchen doxycycline (VIBRA-TABS) 100 MG tablet Take 1 tablet (100 mg total) by mouth 2 (two) times daily. 20 tablet 0  . gabapentin (NEURONTIN) 100 MG capsule Take 100 mg by mouth 3 (three) times daily.    . traMADol (ULTRAM) 50 MG tablet Take 2 every night as needed for pain 60 tablet 0   Facility-Administered Medications Prior to Visit  Medication Dose Route Frequency Provider Last Rate Last Dose  . 0.9 %  sodium chloride infusion  500 mL Intravenous Continuous Ladene Artist, MD        Allergies:  Allergies  Allergen Reactions  . Hydrocodone-Acetaminophen Nausea And Vomiting  . Bee Venom   . Penicillins Rash    Social History   Social History  . Marital status: Single     Spouse name: N/A  . Number of children: N/A  . Years of education: N/A   Occupational History  . Not on file.   Social History Main Topics  . Smoking status: Current Every Day Smoker    Packs/day: 1.00    Types: Cigarettes  . Smokeless tobacco: Never Used  . Alcohol use 14.4 oz/week    24 Cans of beer per week     Comment: 24 cans beer week  . Drug use: No  . Sexual activity: Not Currently   Other Topics Concern  . Not on file   Social History Narrative   Retired.    Plays golf 4 days/week.   Works in yard.    Drinks 6 beers each evening.    Family History  Problem Relation Age of Onset  . Cancer Mother        brain  . Colon polyps Neg Hx   . Colon cancer Neg Hx   . Rectal cancer Neg Hx   . Stomach cancer Neg  Hx     Physical Exam: Blood pressure 130/80, pulse 67, temperature 97.6 F (36.4 C), temperature source Oral, resp. rate 16, weight 216 lb 12.8 oz (98.3 kg), SpO2 96 %.  General: Well developed, well nourished,WM. Appears in no acute distress. HEENT: Normocephalic, atraumatic. Conjunctiva pink, sclera non-icteric. Pupils 2 mm constricting to 1 mm, round, regular, and equally reactive to light and accomodation. EOMI. Internal auditory canal clear. TMs with good cone of light and without pathology. Nasal mucosa pink. Nares are without discharge. No sinus tenderness. Oral mucosa pink. Dentition good. Pharynx without exudate.   Neck: Supple. Trachea midline. No thyromegaly. Full ROM. No lymphadenopathy. Lungs: Clear to auscultation bilaterally without wheezes, rales, or rhonchi. Breathing is of normal effort and unlabored. Cardiovascular: RRR with S1 S2. No murmurs, rubs, or gallops. Distal pulses 2+ symmetrically. No carotid or abdominal bruits. Abdomen: Soft, non-tender, non-distended with normoactive bowel sounds. No hepatosplenomegaly or masses. No rebound/guarding. No CVA tenderness.  He does have ventral hernia present. Rectal:Deferred. Sees specialist for  prostate cancer. Musculoskeletal: Full range of motion and 5/5 strength throughout.  Skin: Warm and moist without erythema, ecchymosis, wounds, or rash. Neuro: A+Ox3. CN II-XII grossly intact. Moves all extremities spontaneously. Full sensation throughout. Normal gait. Psych:  Responds to questions appropriately with a normal affect.   Assessment/Plan:  71 y.o. y/o white male here for CPE  Visit for preventive health examination A. Screening Labs: Not checking PSA--This is managed by specialist. He did have coffee with cream and sugar this morning but otherwise fasting - CBC with Differential/Platelet - COMPLETE METABOLIC PANEL WITH GFR - Lipid panel - TSH - VITAMIN D 25 Hydroxy (Vit-D Deficiency, Fractures)  C. Screening For Colorectal Cancer:  He had a colonoscopy 02/13/2005 that showed polyps. He had another colonoscopy 11/16/2010. Positive polyps. Repeat 5 years.  He had another colonoscopy 11/22/2015 with multiple polyps. Report states to repeat in 3-5 years depending on the pathology report from those polyps.  D. Immunizations: Flu---------------------N/A Tetanus------------- At CPE 05/2015---Discussed that Medicare does not cover this. 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 He is going to call his insurance and find out what the cost would be and then he may         return for this depending on cost. 07/2016-----he states that he did get tetanus vaccine at the [place] in the past year. Pneumococcal------- he received Pneumovax 23 03/18/2013.   Prevnar 13 ----Given here 06/01/2015 Zostavax---------------He states that he did receive Zostavax. He is not sure of exact date says he knows he was at least 71 years old when he had it.  Pre-operative cardiovascular examination, high risk surgery  - EKG 12-Lead - Ambulatory referral to Cardiology - CBC with Differential/Platelet - COMPLETE METABOLIC PANEL WITH GFR - Lipid panel - TSH  EKG shows normal sinus rhythm 66 bpm.  There are T-wave inversions in V2 through V6. These T-wave inversions may be secondary to LVH but he needs to see cardiology. He reports that he is having no angina symptoms even with exertion but his exertion is limited secondary to his knee pain.  Abnormal EKG EKG shows normal sinus rhythm 66 bpm. There are T-wave inversions in V2 through V6. These T-wave inversions may be secondary to LVH but he needs to see cardiology. He reports that he is having no angina symptoms even with exertion but his exertion is limited secondary to his knee pain.  - Ambulatory referral to Cardiology  Essential hypertension  Blood pressure is at goal. Continue current medication.  Prostate cancer [facility]) He states that he still sees urology every 6 months and that this has been well controlled. Thinks that his last visit there was an margin at his next visit there is scheduled for October.  OSA on CPAP  Ventral hernia without obstruction or gangrene  Chronic pain of left knee He states that currently he is using Naprosyn for his knee pain. However still has pain and is very difficult to get comfortable to sleep at night. Will add tramadol to use for this. - traMADol (ULTRAM) 50 MG tablet; Take 1 - 2 every 8 hours--as needed for pain  Dispense: 60 tablet; Refill: 0   Insomnia Says he uses 1/4 PRN for insomnia.  - ALPRAZolam (XANAX) 1 MG tablet; Take 1/2 twice a day as needed.   ADDENDUM ADDED---RECEIVED RECORDS FROM THE V[facility] IN : V.A. Records include: Ultrasound to screen for abdominal aortic aneurysm was performed 01/25/2016 and was negative with no AAA. Screening CT scan of lungs performed 02/28/2016 showed benign appearance. Lab results from 01/03/16 --- Cholesterol 191, triglyceride 178, HDL 70, LDL 85.      A1c 5.4.    CBC normal with WBC 6.69, hemoglobin 14.6, hematocrit 41.4, platelet 211.   Bmet normal. LFTs normal.  Vitamin B12 was 292 and folate 10.0. Vitamin D was 27.   Subjective:    Patient presents for Medicare Annual/Subsequent preventive examination.   Review Past Medical/Family/Social:Are all reviewed today.   Risk Factors  Current exercise habits:  He plays golf about 4 times a week. He is very active in his yard and shop.However his activity has recently been more limited secondary to knee pain and says that when he plays golf he "pays for it after ward ". Dietary issues discussed:  He follows low-cholesterol low-sodium diet.  Cardiac risk factors:  Age, Male  Depression Screen  (Note: if answer to either of the following is "Yes", a more complete depression screening is indicated)  Over the past two weeks, have you felt down, depressed or hopeless? No Over the past two weeks, have you felt little interest or pleasure in doing things? No Have you lost interest or pleasure in daily life? No Do you often feel hopeless? No Do you cry easily over simple problems? No   Activities of Daily Living  In your present state of health, do you have any difficulty performing the following activities?:  Driving? No  Managing money? No  Feeding yourself? No  Getting from bed to chair? No  Climbing a flight of stairs? No  Preparing food and eating?: No  Bathing or showering? No  Getting dressed: No  Getting to the toilet? No  Using the toilet:No  Moving around from place to place: No  In the past year have you fallen or had a near fall?:No  Are you sexually active? No  Do you have more than one partner? No   Hearing Difficulties: No  Do you often ask people to speak up or repeat themselves? No  Do you experience ringing or noises in your ears? No Do you have difficulty understanding soft or whispered voices? No  Do you feel that you have a problem with memory? No Do you often misplace items? No  Do you feel safe at home? Yes  Cognitive Testing  Alert? Yes Normal Appearance?Yes  Oriented to person? Yes Place? Yes  Time? Yes  Recall of three objects? Yes   Can perform simple  calculations? Yes  Displays appropriate judgment?Yes  Can read the correct time from a watch face?Yes   List the Names of Other Physician/Practitioners you currently use:  He sees specialist for prostate cancer Sees [facility] for his colonoscopies Sees [facility] for his [facility] Sees no other specialists.  Indicate any recent Medical Services you may have received from other than [facility] providers in the past year (date may be approximate).   Screening Tests / Date------------- All of this information is documented above. See above. Colonoscopy                     Zostavax  Mammogram  Influenza Vaccine  Tetanus/tdap    Assessment:    Annual wellness medicare exam   Plan:    During the course of the visit the patient was educated and counseled about appropriate screening and preventive services including:  Screening mammography  Colorectal cancer screening  Shingles vaccine. Prescription given to that she can get the vaccine at the pharmacy or Medicare part D.  Screen + for depression. PHQ- 9 score of 12 (moderate depression). We discussed the options of counseling versus possibly a medication. I encouraged her strongly think about the counseling. She is going through some medical problems currently and her husband is as well Mrs. been very stressful for her. She says she will think about it. She does have Xanax to use as needed. Though she may benefit from an SSRI for her more depressive type symptoms but she wants to hold off at this time.  I aksed her to please have her cardioloist send records since we have none on file.  Diet review for nutrition referral? Yes ____ Not Indicated __x__  Patient Instructions (the written plan) was given to the patient.  Medicare Attestation  I have personally reviewed:  The patient's medical and social history  Their use of alcohol, tobacco or illicit drugs  Their current medications and supplements  The patient's  functional ability including ADLs,fall risks, home safety risks, cognitive, and hearing and visual impairment  Diet and physical activities  Evidence for depression or mood disorders  The patient's weight, height, BMI, and visual acuity have been recorded in the chart. I have made referrals, counseling, and provided education to the patient based on review of the above and I have provided the patient with a written personalized care plan for preventive services.      Signed:   [address] [place], [place]  08/21/2016 12:00 PM

## 2016-08-22 LAB — COMPLETE METABOLIC PANEL WITH GFR
ALT: 20 U/L (ref 9–46)
AST: 21 U/L (ref 10–35)
Albumin: 4.1 g/dL (ref 3.6–5.1)
Alkaline Phosphatase: 84 U/L (ref 40–115)
BUN: 19 mg/dL (ref 7–25)
CHLORIDE: 103 mmol/L (ref 98–110)
CO2: 26 mmol/L (ref 20–31)
Calcium: 9 mg/dL (ref 8.6–10.3)
Creat: 0.83 mg/dL (ref 0.70–1.18)
GFR, Est African American: >89 mL/min (ref >=60)
GFR, Est Non African American: 89 mL/min (ref >=60)
GLUCOSE: 97 mg/dL (ref 70–99)
POTASSIUM: 4.3 mmol/L (ref 3.5–5.3)
SODIUM: 141 mmol/L (ref 135–146)
Total Bilirubin: 0.6 mg/dL (ref 0.2–1.2)
Total Protein: 6.8 g/dL (ref 6.1–8.1)

## 2016-08-22 LAB — LIPID PANEL
CHOL/HDL RATIO: 4.7 ratio (ref <5.0)
Cholesterol: 219 mg/dL — ABNORMAL HIGH (ref <200)
HDL: 47 mg/dL (ref >40)
LDL Cholesterol: 139 mg/dL — ABNORMAL HIGH (ref <100)
Triglycerides: 166 mg/dL — ABNORMAL HIGH (ref <150)
VLDL: 33 mg/dL — AB (ref <30)

## 2016-08-29 NOTE — Progress Notes (Signed)
Cardiology Office Note   Date:  08/31/2016   ID:  Hunter Porter, DOB 1945/10/09, MRN [id number]  PCP:  Orlena Sheldon, PA-C  Cardiologist:   Minus Breeding, MD  Referring:  Orlena Sheldon, PA-C  Chief Complaint  Patient presents with  . Pre-op Exam      History of Present Illness: Hunter Porter is a 71 y.o. male who is referred by Orlena Sheldon, PA-C for evaluation of an abnormal EKG and preop evaluation.  His EKG demonstrates T wave inversion in the anterior leads.  I do not see an old EKG for comparison.    He is somewhat limited by knee pain.  However, he can do activities like some walking and stairs without chest pain.  He cannot exercise.  The patient denies any new symptoms such as chest discomfort, neck or arm discomfort. There has been no new shortness of breath, PND or orthopnea. There have been no reported palpitations, presyncope or syncope.     Past Medical History:  Diagnosis Date  . Adenomatous colon polyp   . Allergy   . Anxiety   . Arthritis   . Cataract    removed bilateral  . GERD (gastroesophageal reflux disease)   . H/O hemorrhoids   . H/O hiatal hernia   . Heart murmur 2007   per pt history- possible murmur by PCp- states negative stress test  . Hyperlipidemia   . Prostate cancer ([place]) 01/20/2013  . Sleep apnea    CPAP.  Trying to use it.   . Thyroid disease    graves  . Ulcer approx 1970   HX peptic ulcer    Past Surgical History:  Procedure Laterality Date  . CATARACT EXTRACTION W/ INTRAOCULAR LENS  IMPLANT, BILATERAL    . COLONOSCOPY    . LYMPHADENECTOMY Bilateral 03/17/2013   Procedure: LYMPHADENECTOMY PELVIC LYMPH NODE DISSECTION;  Surgeon: [name], MD;  Location: [facility];  Service: Urology;  Laterality: Bilateral;  . POLYPECTOMY    . PROSTATE BIOPSY    . ROBOT ASSISTED LAPAROSCOPIC RADICAL PROSTATECTOMY N/A 03/17/2013   Procedure: ROBOTIC ASSISTED LAPAROSCOPIC RADICAL PROSTATECTOMY;  Surgeon: [name], MD;  Location[facility];  Service: Urology;  Laterality: N/A;  . TONSILLECTOMY    . TRANSURETHRAL RESECTION OF PROSTATE  12/01/2012   Dr Risa Grill  . UPPER GASTROINTESTINAL ENDOSCOPY       Current Outpatient Prescriptions  Medication Sig Dispense Refill  . ALPRAZolam (XANAX) 1 MG tablet TAKE 1/2 TABLET TWICE DAILY AS NEEDED. 30 tablet 0  . aspirin 81 MG tablet Take 81 mg by mouth daily.    . calcium carbonate 1250 MG capsule Take 1,250 mg by mouth daily.    . cholecalciferol (VITAMIN D) 1000 units tablet Take 1,000 Units by mouth daily.    . CHONDROITIN SULFATE A PO Take 1,200 mg by mouth 2 (two) times daily.    Marland Kitchen EPINEPHrine 0.3 mg/0.3 mL IJ SOAJ injection Inject 0.3 mLs (0.3 mg total) into the muscle once. For allergic reaction to bee stings 1 Device 3  . glucosamine-chondroitin 500-400 MG tablet Take 1 tablet by mouth 2 (two) times daily.    . hydrochlorothiazide (HYDRODIURIL) 25 MG tablet Take 1 tablet (25 mg total) by mouth daily. 30 tablet 3  . magnesium 30 MG tablet Take 30 mg by mouth daily.    . Multiple Vitamin (MULTIVITAMIN) capsule Take 1 capsule by mouth daily.    . naproxen (NAPROSYN) 375 MG tablet  TAKE 1 TABLET TWICE DAILY AS NEEDED FOR PAIN.TAKE WITH FOOD. 60 tablet 0  . NEXIUM 40 MG capsule TAKE ONE CAPSULE DAILY. 30 capsule 6  . Potassium 99 MG TABS Take 1 tablet by mouth daily.      . traMADol (ULTRAM) 50 MG tablet Take 1 - 2 every 8 hours--as needed for pain 60 tablet 0  . zinc gluconate 50 MG tablet Take 50 mg by mouth daily.     Current Facility-Administered Medications  Medication Dose Route Frequency Provider Last Rate Last Dose  . 0.9 %  sodium chloride infusion  500 mL Intravenous Continuous Ladene Artist, MD        Allergies:   Hydrocodone-acetaminophen; Bee venom; and Penicillins    Social History:  The patient  reports that he has been smoking Cigarettes.  He has been smoking about 1.00 pack per day. He has never used smokeless tobacco. He reports that he drinks about 14.4  oz of alcohol per week . He reports that he does not use drugs.   Family History:  The patient's family history includes Cancer in his brother and mother.    ROS:  Please see the history of present illness.   Otherwise, review of systems are positive for none.   All other systems are reviewed and negative.    PHYSICAL EXAM: VS:  BP 112/68   Pulse 79   Ht 5\' 8"  (1.727 m)   Wt 215 lb (97.5 kg)   BMI 32.69 kg/m  , BMI Body mass index is 32.69 kg/m. GENERAL:  Well appearing HEENT:  Pupils equal round and reactive, fundi not visualized, oral mucosa unremarkable NECK:  No jugular venous distention, waveform within normal limits, carotid upstroke brisk and symmetric, no bruits, no thyromegaly LYMPHATICS:  No cervical, inguinal adenopathy LUNGS:  Clear to auscultation bilaterally BACK:  No CVA tenderness CHEST:  Unremarkable HEART:  PMI not displaced or sustained,S1 and S2 within normal limits, no S3, no S4, no clicks, no rubs, no murmurs ABD:  Flat, positive bowel sounds normal in frequency in pitch, no bruits, no rebound, no guarding, no midline pulsatile mass, no hepatomegaly, no splenomegaly EXT:  2 plus pulses throughout, no edema, no cyanosis no clubbing SKIN:  No rashes no nodules NEURO:  Cranial nerves II through XII grossly intact, motor grossly intact throughout PSYCH:  Cognitively intact, oriented to person place and time    EKG:  EKG is ordered today. The ekg ordered today demonstrates Sinus rhythm, rate 79, axis within normal limits, intervals within normal limits, low voltage on the limb leads, anterior T-wave inversions consistent with ischemia.   Recent Labs: 08/21/2016: ALT 20; BUN 19; Creat 0.83; Hemoglobin 14.7; Platelets 202; Potassium 4.3; Sodium 141; TSH 2.14    Lipid Panel    Component Value Date/Time   CHOL 219 (H) 08/21/2016 1218   TRIG 166 (H) 08/21/2016 1218   HDL 47 08/21/2016 1218   CHOLHDL 4.7 08/21/2016 1218   VLDL 33 (H) 08/21/2016 1218   LDLCALC  139 (H) 08/21/2016 1218      Wt Readings from Last 3 Encounters:  08/30/16 215 lb (97.5 kg)  08/21/16 216 lb 12.8 oz (98.3 kg)  07/05/16 218 lb (98.9 kg)      Other studies Reviewed: Additional studies/ records that were reviewed today include: EKG. Review of the above records demonstrates:  Please see elsewhere in the note.     ASSESSMENT AND PLAN:   ABNORMAL EKG:  The patient has an EKG  that would be consistent with ischemia. He does not have a high functional level given his knee pain. His significant cardiovascular risk factors. Given this, based on ACC/AHA guidelines, the patient needs further testing. He would not be a walker treadmill.   Therefore, he will have a [employer].  PREOP:  As above.  TOBACCO:  We discussed this at length and he will try "cold [place]."  DYSLIPIDEMIA:  We discussed diet at length and then a follow up lipid profile.  He does have increased cardiovascular risk and would likely meet guidelines therapy criteria.  He has had muscle aches on Lipitor.   RISK FACTORS:  We had a long discussion about his increased risk for vascular disease.    Current medicines are reviewed at length with the patient today.  The patient does not have concerns regarding medicines.  The following changes have been made:  no change  Labs/ tests ordered today include:   Orders Placed This Encounter  Procedures  . Myocardial Perfusion Imaging  . EKG 12-Lead     Disposition:   FU with me as needed based on the stress test results.     Signed, Minus Breeding, MD  08/31/2016 8:08 PM    [facility]

## 2016-08-30 ENCOUNTER — Encounter: Payer: Self-pay | Admitting: Cardiology

## 2016-08-30 ENCOUNTER — Ambulatory Visit (INDEPENDENT_AMBULATORY_CARE_PROVIDER_SITE_OTHER): Payer: Medicare Other | Admitting: Cardiology

## 2016-08-30 VITALS — BP 112/68 | HR 79 | Ht 68.0 in | Wt 215.0 lb

## 2016-08-30 DIAGNOSIS — E785 Hyperlipidemia, unspecified: Secondary | ICD-10-CM

## 2016-08-30 DIAGNOSIS — Z720 Tobacco use: Secondary | ICD-10-CM | POA: Diagnosis not present

## 2016-08-30 DIAGNOSIS — Z01810 Encounter for preprocedural cardiovascular examination: Secondary | ICD-10-CM | POA: Diagnosis not present

## 2016-08-30 DIAGNOSIS — R9431 Abnormal electrocardiogram [ECG] [EKG]: Secondary | ICD-10-CM

## 2016-08-30 NOTE — Patient Instructions (Signed)
Medication Instructions:  Your physician recommends that you continue on your current medications as directed. Please refer to the Current Medication list given to you today.  Labwork: None   Testing/Procedures: Your physician has requested that you have a lexiscan myoview. For further information please visit [email or web address]. Please follow instruction sheet, as given.   Follow-Up: [facility] RECOMMENDS YOU FOLLOW UP WITH OUR OFFICE AS NEEDED  Any Other Special Instructions Will Be Listed Below (If Applicable).     If you need a refill on your cardiac medications before your next appointment, please call your pharmacy.

## 2016-08-31 ENCOUNTER — Encounter: Payer: Self-pay | Admitting: Cardiology

## 2016-08-31 DIAGNOSIS — R9431 Abnormal electrocardiogram [ECG] [EKG]: Secondary | ICD-10-CM | POA: Insufficient documentation

## 2016-08-31 DIAGNOSIS — Z01810 Encounter for preprocedural cardiovascular examination: Secondary | ICD-10-CM | POA: Insufficient documentation

## 2016-08-31 DIAGNOSIS — E785 Hyperlipidemia, unspecified: Secondary | ICD-10-CM | POA: Insufficient documentation

## 2016-08-31 DIAGNOSIS — Z720 Tobacco use: Secondary | ICD-10-CM | POA: Insufficient documentation

## 2016-09-03 ENCOUNTER — Ambulatory Visit (HOSPITAL_COMMUNITY)
Admission: RE | Admit: 2016-09-03 | Discharge: 2016-09-03 | Disposition: A | Discharge status: Home / Self Care | Payer: Medicare Other | Source: Ambulatory Visit | Attending: Cardiovascular Disease | Admitting: Cardiovascular Disease

## 2016-09-03 DIAGNOSIS — R9431 Abnormal electrocardiogram [ECG] [EKG]: Secondary | ICD-10-CM | POA: Diagnosis not present

## 2016-09-03 LAB — MYOCARDIAL PERFUSION IMAGING
CHL CUP NUCLEAR SDS: 0
CSEPPHR: 85 {beats}/min
LV dias vol: 122 mL (ref 62–150)
LVSYSVOL: 58 mL
Rest HR: 59 {beats}/min
SRS: 1
SSS: 1
TID: 1.12

## 2016-09-03 MED ORDER — TECHNETIUM TC 99M TETROFOSMIN IV KIT
30.6000 | PACK | Freq: Once | INTRAVENOUS | Status: AC | PRN
  Administered 2016-09-03: 30.6 via INTRAVENOUS
  Filled 2016-09-03: qty 31

## 2016-09-03 MED ORDER — TECHNETIUM TC 99M TETROFOSMIN IV KIT
10.0000 | PACK | Freq: Once | INTRAVENOUS | Status: AC | PRN
  Administered 2016-09-03: 10 via INTRAVENOUS
  Filled 2016-09-03: qty 10

## 2016-09-03 MED ORDER — REGADENOSON 0.4 MG/5ML IV SOLN
0.4000 mg | Freq: Once | INTRAVENOUS | Status: AC
  Administered 2016-09-03: 0.4 mg via INTRAVENOUS

## 2016-09-04 NOTE — Progress Notes (Signed)
I was unable to reach patient, per Dr. Percival Spanish, he is cleared for surgery. Please send result to PCP and clearance letter to Dr. Noemi Chapel and [name] PA-C. Also inform patient

## 2016-09-11 ENCOUNTER — Ambulatory Visit: Payer: Medicare Other | Admitting: Physician Assistant

## 2016-09-11 ENCOUNTER — Telehealth: Payer: Self-pay | Admitting: Physician Assistant

## 2016-09-11 NOTE — Telephone Encounter (Signed)
Pt is aware of result

## 2016-09-11 NOTE — Telephone Encounter (Signed)
New message ° ° ° °Pt is returning call from yesterday about results.  °

## 2016-09-23 DIAGNOSIS — M1712 Unilateral primary osteoarthritis, left knee: Secondary | ICD-10-CM | POA: Diagnosis present

## 2016-09-23 NOTE — H&P (Signed)
PREOPERATIVE H&P Patient ID: Hunter Porter MRN: [id number] DOB/AGE: 04/07/1945 71 y.o.  Chief Complaint: OA LEFT KNEE  Planned Procedure Date: 10/15/16 Medical Clearance by [name] [facility]   Cardiac Clearance by Dr. Percival Spanish   HPI: Hunter Porter is a 71 y.o. male smoker with a history of prostate cancer s/p surgery / radiation treatment, daily etoh use (4-6 beers / day), GERD, COPD, OSA - has not used CPAP in recent past, and Graves disease who presents for evaluation of OA LEFT KNEE. The patient has a history of pain and functional disability in the left knee due to arthritis and has failed non-surgical conservative treatments for greater than 12 weeks to include NSAID's and/or analgesics, corticosteriod injections, weight reduction as appropriate and activity modification.  Onset of symptoms was gradual, starting >10 years ago with gradually worsening course since that time.  Patient currently rates pain at 8 out of 10 with activity. Patient has night pain, worsening of pain with activity and weight bearing and pain that interferes with activities of daily living.  Patient has evidence of subchondral cysts, subchondral sclerosis, periarticular osteophytes and joint space narrowing by imaging studies. There is no active infection.  Past Medical History:  Diagnosis Date  . Adenomatous colon polyp   . Allergy   . Anxiety   . Arthritis   . Cataract    removed bilateral  . GERD (gastroesophageal reflux disease)   . H/O hemorrhoids   . H/O hiatal hernia   . Heart murmur 2007   per pt history- possible murmur by PCp- states negative stress test  . Hyperlipidemia   . Prostate cancer ([place]) 01/20/2013  . Sleep apnea    CPAP.  Trying to use it.   . Thyroid disease    graves  . Ulcer approx 1970   HX peptic ulcer   Past Surgical History:  Procedure Laterality Date  . CATARACT EXTRACTION W/ INTRAOCULAR LENS  IMPLANT, BILATERAL    . COLONOSCOPY    . LYMPHADENECTOMY Bilateral  03/17/2013   Procedure: LYMPHADENECTOMY PELVIC LYMPH NODE DISSECTION;  Surgeon: [name], MD;  Location: [facility];  Service: Urology;  Laterality: Bilateral;  . POLYPECTOMY    . PROSTATE BIOPSY    . ROBOT ASSISTED LAPAROSCOPIC RADICAL PROSTATECTOMY N/A 03/17/2013   Procedure: ROBOTIC ASSISTED LAPAROSCOPIC RADICAL PROSTATECTOMY;  Surgeon: [name], MD;  Location: [facility];  Service: Urology;  Laterality: N/A;  . TONSILLECTOMY    . TRANSURETHRAL RESECTION OF PROSTATE  12/01/2012   Dr Risa Grill  . UPPER GASTROINTESTINAL ENDOSCOPY     Allergies  Allergen Reactions  . Hydrocodone-Acetaminophen Nausea And Vomiting  . Bee Venom   . Penicillins Rash   Prior to Admission medications   Medication Sig Start Date End Date Taking? Authorizing Provider  ALPRAZolam Duanne Moron) 1 MG tablet TAKE 1/2 TABLET TWICE DAILY AS NEEDED. 10/05/15   Orlena Sheldon, PA-C  aspirin 81 MG tablet Take 81 mg by mouth daily.    [provider]  calcium carbonate 1250 MG capsule Take 1,250 mg by mouth daily.    [provider]  cholecalciferol (VITAMIN D) 1000 units tablet Take 1,000 Units by mouth daily.    [provider]  CHONDROITIN SULFATE A PO Take 1,200 mg by mouth 2 (two) times daily.    [provider]  EPINEPHrine 0.3 mg/0.3 mL IJ SOAJ injection Inject 0.3 mLs (0.3 mg total) into the muscle once. For allergic reaction to bee stings 11/02/13   [facility],  Modena Nunnery, MD  glucosamine-chondroitin 500-400 MG tablet Take 1 tablet by mouth 2 (two) times daily.    [provider]  hydrochlorothiazide (HYDRODIURIL) 25 MG tablet Take 1 tablet (25 mg total) by mouth daily. 05/29/16   Orlena Sheldon, PA-C  magnesium 30 MG tablet Take 30 mg by mouth daily.    [provider]  Multiple Vitamin (MULTIVITAMIN) capsule Take 1 capsule by mouth daily.    [provider]  naproxen (NAPROSYN) 375 MG tablet TAKE 1 TABLET TWICE DAILY AS NEEDED FOR PAIN.TAKE WITH FOOD. 08/21/16   [name] B, PA-C  NEXIUM 40 MG capsule TAKE ONE CAPSULE DAILY. 08/12/13   Orlena Sheldon, PA-C  Potassium 99 MG TABS Take 1 tablet by mouth daily.      [provider]  traMADol (ULTRAM) 50 MG tablet Take 1 - 2 every 8 hours--as needed for pain 08/21/16   [name] B, PA-C  zinc gluconate 50 MG tablet Take 50 mg by mouth daily.    [provider]   Social History   Social History  . Marital status: Single    Spouse name: N/A  . Number of children: N/A  . Years of education: N/A   Social History Main Topics  . Smoking status: Current Every Day Smoker    Packs/day: 1/2 to 1.00    Types: Cigarettes  . Smokeless tobacco: Never Used  . Alcohol use 14.4 oz/week    24 Cans of beer per week     Comment: 24 cans beer week  . Drug use: No  . Sexual activity: Not Currently   Other Topics Concern  . Not on file   Social History Narrative   Retired.    Plays golf 4 days/week.   Works in yard.    Drinks 6 beers each evening.   Family History  Problem Relation Age of Onset  . Cancer Mother        brain  . Cancer Brother        Question lung  . Colon polyps Neg Hx   . Colon cancer Neg Hx   . Rectal cancer Neg Hx   . Stomach cancer Neg Hx     ROS: Currently denies lightheadedness, dizziness, Fever, chills, CP, SOB.   No personal history of DVT, PE, MI, or CVA. No loose teeth or dentures He wears glasses.  All other systems have been reviewed and were otherwise currently negative with the exception of those mentioned in the HPI and as above.  Objective: Vitals: Ht: 5'8" Wt: 215 Temp: 97.7 BP: 125/80 Pulse: 72 O2 93% on room air. Physical Exam: General: Alert, NAD.  Antalgic Gait  HEENT: EOMI, Good Neck Extension  Pulm: No increased work of breathing.  Clear B/L A/P w/o crackle or wheeze.  CV: RRR, No m/g/r appreciated  GI: soft, NT, ND Neuro: Neuro without gross focal deficit.  Sensation intact distally Skin: No lesions in the area of chief  complaint MSK/Surgical Site: Left knee w/o redness or effusion.  + JLT. ROM 5-115.  5/5 strength in extension and flexion.  +EHL/FHL.  NVI.  Stable varus and valgus stress.    Imaging Review Plain radiographs demonstrate severe degenerative joint disease of the left knee.   Assessment: OA LEFT KNEE Principal Problem:   Primary osteoarthritis of left knee Active Problems:   GERD (gastroesophageal reflux disease)   Thyroid disease   Hypertension   Tobacco abuse   Plan: Plan for Procedure(s): TOTAL KNEE  ARTHROPLASTY  The patient history, physical exam, clinical judgement of the provider and imaging are consistent with end stage degenerative joint disease and total joint arthroplasty is deemed medically necessary. The treatment options including medical management, injection therapy, and arthroplasty were discussed at length. The risks and benefits of Procedure(s): TOTAL KNEE ARTHROPLASTY were presented and reviewed.  The risks of nonoperative treatment, versus surgical intervention including but not limited to continued pain, aseptic loosening, stiffness, dislocation/subluxation, infection, bleeding, nerve injury, blood clots, cardiopulmonary complications, morbidity, mortality, among others were discussed. The patient verbalizes understanding and wishes to proceed with the plan.  Patient is being admitted for inpatient treatment for surgery, pain control, PT, OT, prophylactic antibiotics, VTE prophylaxis, progressive ambulation, ADL's and discharge planning.   Dental prophylaxis discussed and recommended for 2 years postoperatively.   The patient does meet the criteria for TXA which will be used perioperatively via IV.    ASA 325 mg  will be used postoperatively for DVT prophylaxis in addition to SCDs, and early ambulation.  The patient is planning to be discharged home with home health services ([facility]) in care of his son Ammaar Encina.  Prudencio Burly III,  PA-C 09/23/2016 5:56 PM

## 2016-10-02 ENCOUNTER — Other Ambulatory Visit (HOSPITAL_COMMUNITY): Payer: Self-pay

## 2016-10-02 NOTE — Pre-Procedure Instructions (Signed)
Hunter Porter  10/02/2016      Hunter Porter [address] [place] [address] Phone: [phone] Fax: [phone]  [facility] [place] [place] - [address] [place] [address] [place] [address] [place] [address] Phone: [phone] Fax: [phone]  [id number] - [place], [place] - [address] [facility] [place] [address] [place] [address] Phone: [phone] Fax: [phone]    Your procedure is scheduled on October 15, 2016  Report to [facility] Admitting Entrance "A" at 8:15  A.M.  Call this number if you have problems the morning of surgery:  [phone]   Remember:  Do not eat food or drink liquids after midnight on October 14, 2016  Take these medicines the morning of surgery with A SIP OF WATER: Cetirizine (ZYRTEC) and if needed ALPRAZolam Duanne Moron) for anxiety.   7 days before surgery stop taking all Aspirins, Vitamins, Fish oils, and Herbal medications. Also stop all NSAIDS i.e. Advil, Motrin, Aleve, Anaprox, Naproxen, BC and Goody Powders.   Do not wear jewelry.  Do not wear lotions, powders, or perfumes, or deoderant.  Do not shave 48 hours prior to surgery.  Men may shave face and neck.  Do not bring valuables to the hospital.  [facility] is not responsible for any belongings or valuables.  Contacts, dentures or bridgework may not be worn into surgery.  Leave your suitcase in the car.  After surgery it may be brought to your room.  For patients admitted to the hospital, discharge time will be determined by your treatment team.  Patients discharged the day of surgery will not be allowed to drive home.   Special instructions:   [facility]- Preparing For Surgery  Before surgery, you can play an important role. Because skin is not sterile, your skin needs to be as free of germs as possible. You can  reduce the number of germs on your skin by washing with CHG (chlorahexidine gluconate) Soap before surgery.  CHG is an antiseptic cleaner which kills germs and bonds with the skin to continue killing germs even after washing.  Please do not use if you have an allergy to CHG or antibacterial soaps. If your skin becomes reddened/irritated stop using the CHG.  Do not shave (including legs and underarms) for at least 48 hours prior to first CHG shower. It is OK to shave your face.  Please follow these instructions carefully.   1. Shower the NIGHT BEFORE SURGERY and the MORNING OF SURGERY with CHG.   2. If you chose to wash your hair, wash your hair first as usual with your normal shampoo.  3. After you shampoo, rinse your hair and body thoroughly to remove the shampoo.  4. Use CHG as you would any other liquid soap. You can apply CHG directly to the skin and wash gently with a scrungie or a clean washcloth.   5. Apply the CHG Soap to your body ONLY FROM THE NECK DOWN.  Do not use on open wounds or open sores. Avoid contact with your eyes, ears, mouth and genitals (private parts). Wash genitals (private parts) with your normal soap.  6. Wash thoroughly, paying special attention to the area where your surgery will be performed.  7. Thoroughly rinse your body with warm water from the neck down.  8. DO NOT shower/wash with  your normal soap after using and rinsing off the CHG Soap.  9. Pat yourself dry with a CLEAN TOWEL.   10. Wear CLEAN PAJAMAS   11. Place CLEAN SHEETS on your bed the night of your first shower and DO NOT SLEEP WITH PETS.  Day of Surgery: Do not apply any deodorants/lotions. Please wear clean clothes to the hospital/surgery center.    Please read over the following fact sheets that you were given. Pain Booklet, Coughing and Deep Breathing, MRSA Information and Surgical Site Infection Prevention

## 2016-10-03 ENCOUNTER — Encounter (HOSPITAL_COMMUNITY)
Admission: RE | Admit: 2016-10-03 | Discharge: 2016-10-03 | Disposition: A | Discharge status: Home / Self Care | Payer: Medicare Other | Source: Ambulatory Visit | Attending: Orthopedic Surgery | Admitting: Orthopedic Surgery

## 2016-10-03 ENCOUNTER — Encounter (HOSPITAL_COMMUNITY): Payer: Self-pay

## 2016-10-03 DIAGNOSIS — Z01812 Encounter for preprocedural laboratory examination: Secondary | ICD-10-CM | POA: Insufficient documentation

## 2016-10-03 DIAGNOSIS — M1712 Unilateral primary osteoarthritis, left knee: Secondary | ICD-10-CM | POA: Insufficient documentation

## 2016-10-03 LAB — BASIC METABOLIC PANEL
ANION GAP: 7 (ref 5–15)
BUN: 13 mg/dL (ref 6–20)
CHLORIDE: 99 mmol/L — AB (ref 101–111)
CO2: 31 mmol/L (ref 22–32)
Calcium: 9.1 mg/dL (ref 8.9–10.3)
Creatinine, Ser: 0.92 mg/dL (ref 0.61–1.24)
Glucose, Bld: 99 mg/dL (ref 65–99)
POTASSIUM: 4.2 mmol/L (ref 3.5–5.1)
SODIUM: 137 mmol/L (ref 135–145)

## 2016-10-03 LAB — CBC
HEMATOCRIT: 43.6 % (ref 39.0–52.0)
HEMOGLOBIN: 15.2 g/dL (ref 13.0–17.0)
MCH: 33.4 pg (ref 26.0–34.0)
MCHC: 34.9 g/dL (ref 30.0–36.0)
MCV: 95.8 fL (ref 78.0–100.0)
Platelets: 187 10*3/uL (ref 150–400)
RBC: 4.55 MIL/uL (ref 4.22–5.81)
RDW: 13.9 % (ref 11.5–15.5)
WBC: 6.1 10*3/uL (ref 4.0–10.5)

## 2016-10-03 LAB — SURGICAL PCR SCREEN
MRSA, PCR: NEGATIVE
Staphylococcus aureus: NEGATIVE

## 2016-10-04 NOTE — Progress Notes (Signed)
Anesthesia Chart Review:  Pt is a 71 year old male scheduled for L total knee arthroplasty on 10/15/2016 with Edmonia Lynch, MD  - PCP is [name], [place] - Saw cardiologist Minus Breeding, MD 08/30/16 for eval of abnormal EKG. Stress test ordered, results below. Pt cleared for surgery.   PMH includes:  Hyperlipidemia, Graves' disease, prostate cancer (s/p radical prostatectomy 03/17/13), GERD. Current smoker. BMI 33  Medications include: ASA 81 mg, Nexium, HCTZ, potassium  BP 123/72   Pulse 70   Temp 36.5 C   Resp 20   Ht 5\' 8"  (1.727 m)   Wt 216 lb 4.8 oz (98.1 kg)   SpO2 95%   BMI 32.89 kg/m   Preoperative labs reviewed.    EKG 08/30/16: NSR. ST and T-wave abnormality, consider anterolateral ischemia  Nuclear stress test 09/03/16:   The left ventricular ejection fraction is mildly decreased (45-54%).  Nuclear stress EF: 53%.  There was no ST segment deviation noted during stress.  No T wave inversion was noted during stress.  The study is normal.  This is a low risk study. - Low risk stress nuclear study with normal perfusion and borderline left ventricular global systolic function.  If no changes, I anticipate pt can proceed with surgery as scheduled.   Willeen Cass, FNP-BC [facility] Short Stay Surgical Center/Anesthesiology Phone: [phone] 10/04/2016 2:32 PM

## 2016-10-14 MED ORDER — TRANEXAMIC ACID 1000 MG/10ML IV SOLN
1000.0000 mg | INTRAVENOUS | Status: AC
  Administered 2016-10-15: 1000 mg via INTRAVENOUS
  Filled 2016-10-14: qty 1100

## 2016-10-15 ENCOUNTER — Inpatient Hospital Stay (HOSPITAL_COMMUNITY): Payer: Medicare Other | Admitting: Anesthesiology

## 2016-10-15 ENCOUNTER — Observation Stay (HOSPITAL_COMMUNITY)
Admission: RE | Admit: 2016-10-15 | Discharge: 2016-10-16 | Disposition: A | Discharge status: Home / Self Care | Payer: Medicare Other | Source: Ambulatory Visit | Attending: Orthopedic Surgery | Admitting: Orthopedic Surgery

## 2016-10-15 ENCOUNTER — Inpatient Hospital Stay (HOSPITAL_COMMUNITY): Payer: Medicare Other | Admitting: Emergency Medicine

## 2016-10-15 ENCOUNTER — Encounter (HOSPITAL_COMMUNITY): Payer: Self-pay | Admitting: Certified Registered"

## 2016-10-15 ENCOUNTER — Inpatient Hospital Stay (HOSPITAL_COMMUNITY): Payer: Medicare Other

## 2016-10-15 ENCOUNTER — Encounter (HOSPITAL_COMMUNITY)
Admission: RE | Disposition: A | Discharge status: Home / Self Care | Payer: Self-pay | Source: Ambulatory Visit | Attending: Orthopedic Surgery

## 2016-10-15 DIAGNOSIS — Z6833 Body mass index (BMI) 33.0-33.9, adult: Secondary | ICD-10-CM | POA: Diagnosis not present

## 2016-10-15 DIAGNOSIS — E079 Disorder of thyroid, unspecified: Secondary | ICD-10-CM | POA: Diagnosis present

## 2016-10-15 DIAGNOSIS — I10 Essential (primary) hypertension: Secondary | ICD-10-CM | POA: Diagnosis not present

## 2016-10-15 DIAGNOSIS — F17210 Nicotine dependence, cigarettes, uncomplicated: Secondary | ICD-10-CM | POA: Diagnosis not present

## 2016-10-15 DIAGNOSIS — Z720 Tobacco use: Secondary | ICD-10-CM | POA: Diagnosis present

## 2016-10-15 DIAGNOSIS — Z7982 Long term (current) use of aspirin: Secondary | ICD-10-CM | POA: Diagnosis not present

## 2016-10-15 DIAGNOSIS — G4733 Obstructive sleep apnea (adult) (pediatric): Secondary | ICD-10-CM | POA: Insufficient documentation

## 2016-10-15 DIAGNOSIS — J449 Chronic obstructive pulmonary disease, unspecified: Secondary | ICD-10-CM | POA: Diagnosis not present

## 2016-10-15 DIAGNOSIS — Z79899 Other long term (current) drug therapy: Secondary | ICD-10-CM | POA: Insufficient documentation

## 2016-10-15 DIAGNOSIS — K219 Gastro-esophageal reflux disease without esophagitis: Secondary | ICD-10-CM | POA: Diagnosis not present

## 2016-10-15 DIAGNOSIS — M1712 Unilateral primary osteoarthritis, left knee: Principal | ICD-10-CM | POA: Diagnosis present

## 2016-10-15 DIAGNOSIS — F419 Anxiety disorder, unspecified: Secondary | ICD-10-CM | POA: Insufficient documentation

## 2016-10-15 DIAGNOSIS — M25762 Osteophyte, left knee: Secondary | ICD-10-CM | POA: Insufficient documentation

## 2016-10-15 DIAGNOSIS — Z96652 Presence of left artificial knee joint: Secondary | ICD-10-CM

## 2016-10-15 HISTORY — PX: TOTAL KNEE ARTHROPLASTY: SHX125

## 2016-10-15 SURGERY — ARTHROPLASTY, KNEE, TOTAL
Anesthesia: Spinal | Site: Knee | Laterality: Left

## 2016-10-15 MED ORDER — METOCLOPRAMIDE HCL 5 MG PO TABS: 5.0000 mg | ORAL_TABLET | Freq: Three times a day (TID) | ORAL | Status: DC | PRN

## 2016-10-15 MED ORDER — PHENOL 1.4 % MT LIQD: 1.0000 | OROMUCOSAL | Status: DC | PRN

## 2016-10-15 MED ORDER — KETOROLAC TROMETHAMINE 30 MG/ML IJ SOLN
INTRAMUSCULAR | Status: DC | PRN
  Administered 2016-10-15: 30 mg via INTRAVENOUS

## 2016-10-15 MED ORDER — SODIUM CHLORIDE FLUSH 0.9 % IV SOLN
INTRAVENOUS | Status: DC | PRN
  Administered 2016-10-15: 30 mL via INTRAVENOUS

## 2016-10-15 MED ORDER — BUPIVACAINE HCL (PF) 0.5 % IJ SOLN
INTRAMUSCULAR | Status: DC | PRN
  Administered 2016-10-15: 30 mL

## 2016-10-15 MED ORDER — OXYCODONE-ACETAMINOPHEN 5-325 MG PO TABS: 1.0000 | ORAL_TABLET | ORAL | 0 refills | Status: DC | PRN

## 2016-10-15 MED ORDER — DOCUSATE SODIUM 100 MG PO CAPS
100.0000 mg | ORAL_CAPSULE | Freq: Two times a day (BID) | ORAL | Status: DC
  Administered 2016-10-15 – 2016-10-16 (×2): 100 mg via ORAL
  Filled 2016-10-15 (×2): qty 1

## 2016-10-15 MED ORDER — ACETAMINOPHEN 325 MG PO TABS: 650.0000 mg | ORAL_TABLET | Freq: Four times a day (QID) | ORAL | Status: DC | PRN

## 2016-10-15 MED ORDER — BUPIVACAINE IN DEXTROSE 0.75-8.25 % IT SOLN
INTRATHECAL | Status: DC | PRN
  Administered 2016-10-15: 15 mg via INTRATHECAL

## 2016-10-15 MED ORDER — KETOROLAC TROMETHAMINE 30 MG/ML IJ SOLN
INTRAMUSCULAR | Status: AC
  Filled 2016-10-15: qty 1

## 2016-10-15 MED ORDER — ALPRAZOLAM 0.25 MG PO TABS: 0.2500 mg | ORAL_TABLET | Freq: Two times a day (BID) | ORAL | Status: DC | PRN

## 2016-10-15 MED ORDER — MIDAZOLAM HCL 2 MG/2ML IJ SOLN
INTRAMUSCULAR | Status: AC
  Administered 2016-10-15: 1 mg via INTRAVENOUS
  Filled 2016-10-15: qty 2

## 2016-10-15 MED ORDER — ONDANSETRON HCL 4 MG PO TABS: 4.0000 mg | ORAL_TABLET | Freq: Four times a day (QID) | ORAL | Status: DC | PRN

## 2016-10-15 MED ORDER — CEFAZOLIN SODIUM-DEXTROSE 2-3 GM-% IV SOLR
INTRAVENOUS | Status: DC | PRN
  Administered 2016-10-15: 2 g via INTRAVENOUS

## 2016-10-15 MED ORDER — ONDANSETRON HCL 4 MG PO TABS: 4.0000 mg | ORAL_TABLET | Freq: Three times a day (TID) | ORAL | 0 refills | Status: DC | PRN

## 2016-10-15 MED ORDER — PROPOFOL 500 MG/50ML IV EMUL
INTRAVENOUS | Status: DC | PRN
  Administered 2016-10-15: 75 ug/kg/min via INTRAVENOUS

## 2016-10-15 MED ORDER — LACTATED RINGERS IV SOLN
INTRAVENOUS | Status: DC
  Administered 2016-10-15: 21:00:00 via INTRAVENOUS

## 2016-10-15 MED ORDER — ACETAMINOPHEN 500 MG PO TABS
1000.0000 mg | ORAL_TABLET | Freq: Once | ORAL | Status: AC
  Administered 2016-10-15: 1000 mg via ORAL
  Filled 2016-10-15: qty 2

## 2016-10-15 MED ORDER — ACETAMINOPHEN 500 MG PO TABS
1000.0000 mg | ORAL_TABLET | Freq: Three times a day (TID) | ORAL | Status: DC
  Administered 2016-10-15 – 2016-10-16 (×4): 1000 mg via ORAL
  Filled 2016-10-15 (×4): qty 2

## 2016-10-15 MED ORDER — OXYCODONE HCL 5 MG/5ML PO SOLN: 5.0000 mg | Freq: Once | ORAL | Status: DC | PRN

## 2016-10-15 MED ORDER — FENTANYL CITRATE (PF) 100 MCG/2ML IJ SOLN
50.0000 ug | Freq: Once | INTRAMUSCULAR | Status: AC
  Administered 2016-10-15: 50 ug via INTRAVENOUS

## 2016-10-15 MED ORDER — SENNA 8.6 MG PO TABS
1.0000 | ORAL_TABLET | Freq: Two times a day (BID) | ORAL | Status: DC
  Administered 2016-10-16: 8.6 mg via ORAL
  Filled 2016-10-15: qty 1

## 2016-10-15 MED ORDER — SODIUM CHLORIDE 0.9 % IR SOLN
Status: DC | PRN
  Administered 2016-10-15: 3000 mL

## 2016-10-15 MED ORDER — DOCUSATE SODIUM 100 MG PO CAPS: 100.0000 mg | ORAL_CAPSULE | Freq: Two times a day (BID) | ORAL | 0 refills | Status: DC

## 2016-10-15 MED ORDER — METHOCARBAMOL 500 MG PO TABS
500.0000 mg | ORAL_TABLET | Freq: Four times a day (QID) | ORAL | Status: DC | PRN
  Administered 2016-10-15 – 2016-10-16 (×3): 500 mg via ORAL
  Filled 2016-10-15 (×3): qty 1

## 2016-10-15 MED ORDER — MENTHOL 3 MG MT LOZG: 1.0000 | LOZENGE | OROMUCOSAL | Status: DC | PRN

## 2016-10-15 MED ORDER — TRANEXAMIC ACID 1000 MG/10ML IV SOLN
2000.0000 mg | INTRAVENOUS | Status: AC
  Administered 2016-10-15: 2000 mg via TOPICAL
  Filled 2016-10-15: qty 20

## 2016-10-15 MED ORDER — FENTANYL CITRATE (PF) 100 MCG/2ML IJ SOLN
INTRAMUSCULAR | Status: AC
  Administered 2016-10-15: 50 ug via INTRAVENOUS
  Filled 2016-10-15: qty 2

## 2016-10-15 MED ORDER — PROPOFOL 10 MG/ML IV BOLUS
INTRAVENOUS | Status: DC | PRN
  Administered 2016-10-15 (×2): 20 mg via INTRAVENOUS
  Administered 2016-10-15: 30 mg via INTRAVENOUS
  Administered 2016-10-15: 20 mg via INTRAVENOUS
  Administered 2016-10-15: 10 mg via INTRAVENOUS

## 2016-10-15 MED ORDER — ACETAMINOPHEN 650 MG RE SUPP: 650.0000 mg | Freq: Four times a day (QID) | RECTAL | Status: DC | PRN

## 2016-10-15 MED ORDER — POLYETHYLENE GLYCOL 3350 17 G PO PACK: 17.0000 g | PACK | Freq: Every day | ORAL | Status: DC | PRN

## 2016-10-15 MED ORDER — ASPIRIN EC 325 MG PO TBEC
325.0000 mg | DELAYED_RELEASE_TABLET | Freq: Every day | ORAL | Status: DC
  Administered 2016-10-16: 325 mg via ORAL
  Filled 2016-10-15: qty 1

## 2016-10-15 MED ORDER — METHOCARBAMOL 1000 MG/10ML IJ SOLN
500.0000 mg | Freq: Four times a day (QID) | INTRAVENOUS | Status: DC | PRN
  Filled 2016-10-15: qty 5

## 2016-10-15 MED ORDER — POLYVINYL ALCOHOL 1.4 % OP SOLN
1.0000 [drp] | Freq: Every day | OPHTHALMIC | Status: DC | PRN
  Filled 2016-10-15: qty 15

## 2016-10-15 MED ORDER — ROPIVACAINE HCL 7.5 MG/ML IJ SOLN
INTRAMUSCULAR | Status: DC | PRN
  Administered 2016-10-15: 20 mL via PERINEURAL

## 2016-10-15 MED ORDER — BACLOFEN 10 MG PO TABS: 10.0000 mg | ORAL_TABLET | Freq: Three times a day (TID) | ORAL | 0 refills | Status: DC | PRN

## 2016-10-15 MED ORDER — LACTATED RINGERS IV SOLN
INTRAVENOUS | Status: DC
  Administered 2016-10-15 (×2): via INTRAVENOUS

## 2016-10-15 MED ORDER — MIDAZOLAM HCL 2 MG/2ML IJ SOLN
1.0000 mg | Freq: Once | INTRAMUSCULAR | Status: AC
  Administered 2016-10-15: 1 mg via INTRAVENOUS

## 2016-10-15 MED ORDER — OXYCODONE HCL 5 MG PO TABS
5.0000 mg | ORAL_TABLET | ORAL | Status: DC | PRN
  Administered 2016-10-15 – 2016-10-16 (×4): 10 mg via ORAL
  Filled 2016-10-15 (×4): qty 2

## 2016-10-15 MED ORDER — KETOROLAC TROMETHAMINE 30 MG/ML IJ SOLN
INTRAMUSCULAR | Status: DC | PRN
  Administered 2016-10-15: 30 mg via INTRA_ARTICULAR

## 2016-10-15 MED ORDER — METOCLOPRAMIDE HCL 5 MG/ML IJ SOLN: 5.0000 mg | Freq: Three times a day (TID) | INTRAMUSCULAR | Status: DC | PRN

## 2016-10-15 MED ORDER — OXYCODONE HCL 5 MG PO TABS: 5.0000 mg | ORAL_TABLET | Freq: Once | ORAL | Status: DC | PRN

## 2016-10-15 MED ORDER — ASPIRIN EC 325 MG PO TBEC: 325.0000 mg | DELAYED_RELEASE_TABLET | Freq: Every day | ORAL | 0 refills | Status: DC

## 2016-10-15 MED ORDER — SORBITOL 70 % SOLN: 30.0000 mL | Freq: Every day | Status: DC | PRN

## 2016-10-15 MED ORDER — LACTATED RINGERS IV SOLN: INTRAVENOUS | Status: DC

## 2016-10-15 MED ORDER — HYDROCHLOROTHIAZIDE 25 MG PO TABS
25.0000 mg | ORAL_TABLET | Freq: Every day | ORAL | Status: DC
  Administered 2016-10-16: 25 mg via ORAL
  Filled 2016-10-15: qty 1

## 2016-10-15 MED ORDER — 0.9 % SODIUM CHLORIDE (POUR BTL) OPTIME
TOPICAL | Status: DC | PRN
  Administered 2016-10-15: 1000 mL

## 2016-10-15 MED ORDER — CEFAZOLIN SODIUM-DEXTROSE 2-4 GM/100ML-% IV SOLN
INTRAVENOUS | Status: AC
  Filled 2016-10-15: qty 100

## 2016-10-15 MED ORDER — GABAPENTIN 300 MG PO CAPS
300.0000 mg | ORAL_CAPSULE | Freq: Once | ORAL | Status: AC
  Administered 2016-10-15: 300 mg via ORAL
  Filled 2016-10-15: qty 1

## 2016-10-15 MED ORDER — BUPIVACAINE HCL (PF) 0.5 % IJ SOLN
INTRAMUSCULAR | Status: AC
  Filled 2016-10-15: qty 30

## 2016-10-15 MED ORDER — PHENYLEPHRINE HCL 10 MG/ML IJ SOLN
INTRAVENOUS | Status: DC | PRN
  Administered 2016-10-15: 50 ug/min via INTRAVENOUS

## 2016-10-15 MED ORDER — CHLORHEXIDINE GLUCONATE 4 % EX LIQD: 60.0000 mL | Freq: Once | CUTANEOUS | Status: DC

## 2016-10-15 MED ORDER — HYDROMORPHONE HCL 1 MG/ML IJ SOLN: 0.2500 mg | INTRAMUSCULAR | Status: DC | PRN

## 2016-10-15 MED ORDER — HYDROMORPHONE HCL 1 MG/ML IJ SOLN
0.5000 mg | INTRAMUSCULAR | Status: DC | PRN
  Administered 2016-10-15 – 2016-10-16 (×2): 1 mg via INTRAVENOUS
  Filled 2016-10-15 (×2): qty 1

## 2016-10-15 MED ORDER — POTASSIUM 99 MG PO TABS
99.0000 mg | ORAL_TABLET | Freq: Every day | ORAL | Status: DC
Start: 2016-10-15 — End: 2016-10-15

## 2016-10-15 MED ORDER — PANTOPRAZOLE SODIUM 40 MG PO TBEC
40.0000 mg | DELAYED_RELEASE_TABLET | Freq: Every day | ORAL | Status: DC
  Administered 2016-10-16: 40 mg via ORAL
  Filled 2016-10-15: qty 1

## 2016-10-15 MED ORDER — CELECOXIB 200 MG PO CAPS
200.0000 mg | ORAL_CAPSULE | Freq: Two times a day (BID) | ORAL | Status: DC
  Administered 2016-10-15 – 2016-10-16 (×2): 200 mg via ORAL
  Filled 2016-10-15 (×2): qty 1

## 2016-10-15 MED ORDER — DIPHENHYDRAMINE HCL 12.5 MG/5ML PO ELIX: 12.5000 mg | ORAL_SOLUTION | ORAL | Status: DC | PRN

## 2016-10-15 MED ORDER — DEXAMETHASONE SODIUM PHOSPHATE 10 MG/ML IJ SOLN
10.0000 mg | Freq: Once | INTRAMUSCULAR | Status: AC
  Administered 2016-10-16: 10 mg via INTRAVENOUS
  Filled 2016-10-15: qty 1

## 2016-10-15 MED ORDER — SPIRITUS FRUMENTI
1.0000 | Freq: Once | ORAL | Status: DC | PRN
  Filled 2016-10-15: qty 1

## 2016-10-15 MED ORDER — ONDANSETRON HCL 4 MG/2ML IJ SOLN: 4.0000 mg | Freq: Four times a day (QID) | INTRAMUSCULAR | Status: DC | PRN

## 2016-10-15 SURGICAL SUPPLY — 56 items
BAG DECANTER FOR FLEXI CONT (MISCELLANEOUS) ×2 IMPLANT
BANDAGE ELASTIC 6 VELCRO ST LF (GAUZE/BANDAGES/DRESSINGS) ×2 IMPLANT
BANDAGE ESMARK 6X9 LF (GAUZE/BANDAGES/DRESSINGS) ×1 IMPLANT
BLADE SAG 18X100X1.27 (BLADE) ×2 IMPLANT
BNDG COHESIVE 6X5 TAN STRL LF (GAUZE/BANDAGES/DRESSINGS) ×2 IMPLANT
BNDG ESMARK 6X9 LF (GAUZE/BANDAGES/DRESSINGS) ×2
BOWL SMART MIX CTS (DISPOSABLE) ×2 IMPLANT
CAPT KNEE TRIATH TK-4 ×2 IMPLANT
CLSR STERI-STRIP ANTIMIC 1/2X4 (GAUZE/BANDAGES/DRESSINGS) ×4 IMPLANT
COVER SURGICAL LIGHT HANDLE (MISCELLANEOUS) ×2 IMPLANT
CUFF TOURNIQUET SINGLE 34IN LL (TOURNIQUET CUFF) ×2 IMPLANT
DECANTER SPIKE VIAL GLASS SM (MISCELLANEOUS) ×2 IMPLANT
DRAPE EXTREMITY T 121X128X90 (DRAPE) ×2 IMPLANT
DRAPE HALF SHEET 40X57 (DRAPES) ×2 IMPLANT
DRAPE U-SHAPE 47X51 STRL (DRAPES) ×2 IMPLANT
DRSG MEPILEX BORDER 4X8 (GAUZE/BANDAGES/DRESSINGS) ×2 IMPLANT
DURAPREP 26ML APPLICATOR (WOUND CARE) ×4 IMPLANT
ELECT CAUTERY BLADE 6.4 (BLADE) ×2 IMPLANT
ELECT REM PT RETURN 9FT ADLT (ELECTROSURGICAL) ×2
ELECTRODE REM PT RTRN 9FT ADLT (ELECTROSURGICAL) ×1 IMPLANT
FACESHIELD WRAPAROUND (MASK) ×4 IMPLANT
GLOVE BIO SURGEON STRL SZ7.5 (GLOVE) ×4 IMPLANT
GLOVE BIOGEL PI IND STRL 8 (GLOVE) ×2 IMPLANT
GLOVE BIOGEL PI INDICATOR 8 (GLOVE) ×2
GLOVE SURG SS PI 6.5 STRL IVOR (GLOVE) ×2 IMPLANT
GLOVE SURG SS PI 7.0 STRL IVOR (GLOVE) ×4 IMPLANT
GOWN STRL REUS W/ TWL LRG LVL3 (GOWN DISPOSABLE) ×2 IMPLANT
GOWN STRL REUS W/ TWL XL LVL3 (GOWN DISPOSABLE) ×1 IMPLANT
GOWN STRL REUS W/TWL LRG LVL3 (GOWN DISPOSABLE) ×2
GOWN STRL REUS W/TWL XL LVL3 (GOWN DISPOSABLE) ×1
HANDPIECE INTERPULSE COAX TIP (DISPOSABLE) ×1
IMMOBILIZER KNEE 22 UNIV (SOFTGOODS) ×2 IMPLANT
KIT BASIN OR (CUSTOM PROCEDURE TRAY) ×2 IMPLANT
KIT ROOM TURNOVER OR (KITS) ×2 IMPLANT
MANIFOLD NEPTUNE II (INSTRUMENTS) ×2 IMPLANT
NEEDLE 18GX1X1/2 (RX/OR ONLY) (NEEDLE) ×2 IMPLANT
NEEDLE 22X1 1/2 (OR ONLY) (NEEDLE) ×2 IMPLANT
NS IRRIG 1000ML POUR BTL (IV SOLUTION) ×2 IMPLANT
PACK TOTAL JOINT (CUSTOM PROCEDURE TRAY) ×2 IMPLANT
PAD ARMBOARD 7.5X6 YLW CONV (MISCELLANEOUS) ×2 IMPLANT
SET HNDPC FAN SPRY TIP SCT (DISPOSABLE) ×1 IMPLANT
SUCTION FRAZIER HANDLE 10FR (MISCELLANEOUS) ×1
SUCTION TUBE FRAZIER 10FR DISP (MISCELLANEOUS) ×1 IMPLANT
SUT MNCRL AB 4-0 PS2 18 (SUTURE) ×2 IMPLANT
SUT MON AB 2-0 CT1 27 (SUTURE) ×2 IMPLANT
SUT MON AB 2-0 CT1 36 (SUTURE) ×2 IMPLANT
SUT VIC AB 0 CT1 27 (SUTURE) ×1
SUT VIC AB 0 CT1 27XBRD ANBCTR (SUTURE) ×1 IMPLANT
SUT VIC AB 1 CT1 27 (SUTURE) ×2
SUT VIC AB 1 CT1 27XBRD ANBCTR (SUTURE) ×2 IMPLANT
SYR 50ML LL SCALE MARK (SYRINGE) ×2 IMPLANT
SYR BULB 3OZ (MISCELLANEOUS) ×2 IMPLANT
SYR TB 1ML LUER SLIP (SYRINGE) ×2 IMPLANT
TOWEL OR 17X24 6PK STRL BLUE (TOWEL DISPOSABLE) ×2 IMPLANT
TOWEL OR 17X26 10 PK STRL BLUE (TOWEL DISPOSABLE) ×2 IMPLANT
TRAY FOLEY BAG SILVER LF 14FR (CATHETERS) ×2 IMPLANT

## 2016-10-15 NOTE — Transfer of Care (Signed)
Immediate Anesthesia Transfer of Care Note  Patient: YECHESKEL KUREK  Procedure(s) Performed: Procedure(s): TOTAL KNEE ARTHROPLASTY (Left)  Patient Location: PACU  Anesthesia Type:Regional and Spinal  Level of Consciousness: awake, oriented and patient cooperative  Airway & Oxygen Therapy: Patient Spontanous Breathing and Patient connected to nasal cannula oxygen  Post-op Assessment: Report given to RN, Post -op Vital signs reviewed and stable and Patient moving all extremities  Post vital signs: Reviewed and stable  Last Vitals:  Vitals:   10/15/16 0925 10/15/16 0930  BP: 124/70 125/69  Pulse: 65 64  Resp: 15 13  Temp:    SpO2: 99% 95%    Last Pain:  Vitals:   10/15/16 0930  TempSrc:   PainSc: 0-No pain         Complications: No apparent anesthesia complications

## 2016-10-15 NOTE — Anesthesia Procedure Notes (Addendum)
Anesthesia Regional Block: Adductor canal block   Pre-Anesthetic Checklist: ,, timeout performed, Correct Patient, Correct Site, Correct Laterality, Correct Procedure, Correct Position, site marked, Risks and benefits discussed,  Surgical consent,  Pre-op evaluation,  At surgeon's request and post-op pain management  Laterality: Left and Lower  Prep: chloraprep       Needles:   Needle Type: Echogenic Stimulator Needle     Needle Length: 9cm  Needle Gauge: 21   Needle insertion depth: 6 cm   Additional Needles:   Procedures: ultrasound guided,,,,,,,,  Narrative:  Start time: 10/15/2016 9:15 AM End time: 10/15/2016 9:32 AM Injection made incrementally with aspirations every 5 mL.  Performed by: Personally  Anesthesiologist: Trameka Dorough

## 2016-10-15 NOTE — Progress Notes (Signed)
Orthopedic Tech Progress Note Patient Details:  Hunter Porter 10-Dec-1945 [id number]  CPM Left Knee CPM Left Knee: On Left Knee Flexion (Degrees): 90 Left Knee Extension (Degrees): 0 Additional Comments: foot roll   [name] 10/15/2016, 2:21 PM

## 2016-10-15 NOTE — Evaluation (Signed)
Physical Therapy Evaluation Patient Details Name: Hunter Porter MRN: [id number] DOB: Mar 12, 1945 Today's Date: 10/15/2016   History of Present Illness  Pt is a 71 yo male admitted with L knee pain secondary to end stage knee OA. s/p L TKA 10/15/16.  PMH includes prostate cancer s/p surgery / radiation treatment, daily etoh use (4-6 beers / day), GERD, COPD, OSA - has not used CPAP in recent past, and Graves disease     Clinical Impression  Pt is s/p TKA resulting in the deficits listed below (see PT Problem List). Pt currently mod I for bed mobility, and minA for transfers and ambulation of 125 feet with RW. Pt will benefit from skilled PT to increase their independence and safety with mobility to allow discharge to the venue listed below.      Follow Up Recommendations Home health PT;Supervision/Assistance - 24 hour    Equipment Recommendations  None recommended by PT    Recommendations for Other Services       Precautions / Restrictions Precautions Precautions: Knee Precaution Booklet Issued: Yes (comment) Restrictions Weight Bearing Restrictions: Yes LLE Weight Bearing: Weight bearing as tolerated      Mobility  Bed Mobility Overal bed mobility: Needs Assistance Bed Mobility: Supine to Sit     Supine to sit: HOB elevated;Modified independent (Device/Increase time)     General bed mobility comments: mod I using bed rails and HoB elevated to come to EoB  Transfers Overall transfer level: Needs assistance Equipment used: Rolling walker (2 wheeled) Transfers: Sit to/from Stand Sit to Stand: Min assist         General transfer comment: minA for power up and steadying vc for hand placement  Ambulation/Gait Ambulation/Gait assistance: Min assist Ambulation Distance (Feet): 125 Feet (2x75 feet with one seated rest break ) Assistive device: Rolling walker (2 wheeled) Gait Pattern/deviations: Step-through pattern;Decreased stance time - right;Decreased step length -  left;Antalgic Gait velocity: slowed Gait velocity interpretation: Below normal speed for age/gender General Gait Details: minA for steadying, no LoB, no buckling, vc for upright postrure and increased heel strike       Balance Overall balance assessment: Needs assistance Sitting-balance support: No upper extremity supported;Feet supported Sitting balance-Leahy Scale: Good     Standing balance support: Single extremity supported Standing balance-Leahy Scale: Fair Standing balance comment: utilizes RW for steadying                             Pertinent Vitals/Pain Pain Assessment: 0-10 Pain Score: 2  Pain Location: L knee Pain Descriptors / Indicators: Aching;Constant Pain Intervention(s): Monitored during session;Limited activity within patient's tolerance    Home Living Family/patient expects to be discharged to:: Private residence Living Arrangements: Children Available Help at Discharge: Family;Available PRN/intermittently Type of Home: House Home Access: Stairs to enter Entrance Stairs-Rails: Right;Left Entrance Stairs-Number of Steps: 4 Home Layout: Two level;Able to live on main level with bedroom/bathroom Home Equipment: Gilford Rile - 2 wheels;Bedside commode;Shower seat - built in;Shower seat;Grab bars - tub/shower;Hand held shower head      Prior Function Level of Independence: Independent         Comments: Office manager Dominance   Dominant Hand: Right    Extremity/Trunk Assessment   Upper Extremity Assessment Upper Extremity Assessment: Overall WFL for tasks assessed    Lower Extremity Assessment Lower Extremity Assessment: LLE deficits/detail LLE Deficits / Details: L knee ROM and strength limited by surgical  intervention, L hip and ankle WFL LLE: Unable to fully assess due to pain       Communication   Communication: No difficulties  Cognition Arousal/Alertness: Awake/alert Behavior During Therapy: WFL for  tasks assessed/performed Overall Cognitive Status: Within Functional Limits for tasks assessed                                        General Comments General comments (skin integrity, edema, etc.): VSS, Pt son present throughout session    Exercises Total Joint Exercises Quad Sets: AROM;Left;10 reps;Supine Heel Slides: AROM;10 reps;Supine;Left Long Arc Quad: AROM;Left;10 reps;Seated Goniometric ROM: 12 degrees extension, 89 degrees flexion   Assessment/Plan    PT Assessment Patient needs continued PT services  PT Problem List Decreased strength;Decreased range of motion;Decreased activity tolerance;Decreased mobility;Pain       PT Treatment Interventions DME instruction;Gait training;Stair training;Functional mobility training;Therapeutic activities;Therapeutic exercise;Balance training;Patient/family education    PT Goals (Current goals can be found in the Care Plan section)  Acute Rehab PT Goals Patient Stated Goal: get back to playing golf PT Goal Formulation: With patient Time For Goal Achievement: 10/22/16 Potential to Achieve Goals: Good    Frequency 7X/week    AM-PAC PT "6 Clicks" Daily Activity  Outcome Measure Difficulty turning over in bed (including adjusting bedclothes, sheets and blankets)?: A Little Difficulty moving from lying on back to sitting on the side of the bed? : A Lot Difficulty sitting down on and standing up from a chair with arms (e.g., wheelchair, bedside commode, etc,.)?: Unable Help needed moving to and from a bed to chair (including a wheelchair)?: A Little Help needed walking in hospital room?: A Little Help needed climbing 3-5 steps with a railing? : A Lot 6 Click Score: 14    End of Session Equipment Utilized During Treatment: Gait belt Activity Tolerance: Patient tolerated treatment well Patient left: in chair;with call bell/phone within reach;with family/visitor present Nurse Communication: Mobility status PT Visit  Diagnosis: Other abnormalities of gait and mobility (R26.89);Muscle weakness (generalized) (M62.81);Other (comment);Difficulty in walking, not elsewhere classified (R26.2)    Time: 1224-4975 PT Time Calculation (min) (ACUTE ONLY): 40 min   Charges:   PT Evaluation $PT Eval Low Complexity: 1 Low PT Treatments $Gait Training: 8-22 mins $Therapeutic Exercise: 8-22 mins   PT G Codes:        Anginette Espejo B. Migdalia Dk PT, DPT Acute Rehabilitation  [phone] Pager [phone]    [place] 10/15/2016, 6:05 PM

## 2016-10-15 NOTE — Interval H&P Note (Signed)
History and Physical Interval Note:  10/15/2016 8:07 AM  Hunter Porter  has presented today for surgery, with the diagnosis of OA LEFT KNEE  The various methods of treatment have been discussed with the patient and family. After consideration of risks, benefits and other options for treatment, the patient has consented to  Procedure(s): TOTAL KNEE ARTHROPLASTY (Left) as a surgical intervention .  The patient's history has been reviewed, patient examined, no change in status, stable for surgery.  I have reviewed the patient's chart and labs.  Questions were answered to the patient's satisfaction.     MURPHY, TIMOTHY D

## 2016-10-15 NOTE — Op Note (Signed)
DATE OF SURGERY:  10/15/2016 TIME: 1:02 PM  PATIENT NAME:  Hunter Porter   AGE: 71 y.o.    PRE-OPERATIVE DIAGNOSIS:  OA LEFT KNEE  POST-OPERATIVE DIAGNOSIS:  Same  PROCEDURE:  Procedure(s): TOTAL KNEE ARTHROPLASTY   SURGEON:  Izzabell Klasen D, MD   ASSISTANT:  Roxan Hockey, PA-C, he was present and scrubbed throughout the case, critical for completion in a timely fashion, and for retraction, instrumentation, and closure.    OPERATIVE IMPLANTS: [employer] Triathlon Posterior Stabilized. Press fit knee  Femur size 6, Tibia size 6, Patella size 35 3-peg oval button, with a 9 mm polyethylene insert.   PREOPERATIVE INDICATIONS:  Hunter Porter is a 71 y.o. year old male with end stage bone on bone degenerative arthritis of the knee who failed conservative treatment, including injections, antiinflammatories, activity modification, and assistive devices, and had significant impairment of their activities of daily living, and elected for Total Knee Arthroplasty.   The risks, benefits, and alternatives were discussed at length including but not limited to the risks of infection, bleeding, nerve injury, stiffness, blood clots, the need for revision surgery, cardiopulmonary complications, among others, and they were willing to proceed.   OPERATIVE DESCRIPTION:  The patient was brought to the operative room and placed in a supine position.  General anesthesia was administered.  IV antibiotics were given.  The lower extremity was prepped and draped in the usual sterile fashion.  Time out was performed.  The leg was elevated and exsanguinated and the tourniquet was inflated.  Anterior approach was performed.  The patella was everted and osteophytes were removed.  The anterior horn of the medial and lateral meniscus was removed.   The distal femur was opened with the drill and the intramedullary distal femoral cutting jig was utilized, set at 5 degrees resecting 10 mm off the distal femur.   Care was taken to protect the collateral ligaments.  The distal femoral sizing jig was applied, taking care to avoid notching.  Then the 4-in-1 cutting jig was applied and the anterior and posterior femur was cut, along with the chamfer cuts.  All posterior osteophytes were removed.  The flexion gap was then measured and was symmetric with the extension gap.  Then the extramedullary tibial cutting jig was utilized making the appropriate cut using the anterior tibial crest as a reference building in appropriate posterior slope.  Care was taken during the cut to protect the medial and collateral ligaments.  The proximal tibia was removed along with the posterior horns of the menisci.  The PCL was sacrificed.    The extensor gap was measured and was approximately 1mm.    I completed the distal femoral preparation using the appropriate jig to prepare the box.  The patella was then measured, and cut with the saw.    The proximal tibia sized and prepared accordingly with the reamer and the punch, and then all components were trialed with the above sized poly insert.  The knee was found to have excellent balance and full motion.    The above named components were then impacted into place and Poly tibial piece and patella were inserted.  I was very happy with his stability and ROM  I performed a periarticular injection with marcaine and toradol  The knee was easily taken through a range of motion and the patella tracked well and the knee irrigated copiously and the parapatellar and subcutaneous tissue closed with vicryl, and monocryl with steri strips for the skin.  The incision was dressed with sterile gauze and the tourniquet released and the patient was awakened and returned to the PACU in stable and satisfactory condition.  There were no complications.  Total tourniquet time was roughly 75 minutes.   POSTOPERATIVE PLAN: post op Abx, DVT px: SCD's, TED's, Early ambulation and chemical  px

## 2016-10-15 NOTE — Anesthesia Preprocedure Evaluation (Addendum)
Anesthesia Evaluation  Patient identified by MRN, date of birth, ID band Patient awake    Reviewed: Allergy & Precautions, NPO status   History of Anesthesia Complications Negative for: history of anesthetic complications  Airway Mallampati: III  TM Distance: >3 FB Neck ROM: Full    Dental no notable dental hx.    Pulmonary sleep apnea , Current Smoker,    breath sounds clear to auscultation       Cardiovascular hypertension,  Rhythm:Regular Rate:Normal     Neuro/Psych    GI/Hepatic hiatal hernia, GERD  ,  Endo/Other  Morbid obesity  Renal/GU      Musculoskeletal  (+) Arthritis ,   Abdominal (+) + obese,   Peds  Hematology   Anesthesia Other Findings   Reproductive/Obstetrics                            Anesthesia Physical Anesthesia Plan  ASA: III  Anesthesia Plan: Spinal   Post-op Pain Management:  Regional for Post-op pain   Induction: Intravenous  PONV Risk Score and Plan: 3 and Ondansetron, Dexamethasone, Midazolam and Propofol infusion  Airway Management Planned:   Additional Equipment:   Intra-op Plan:   Post-operative Plan: Extubation in OR  Informed Consent: I have reviewed the patients History and Physical, chart, labs and discussed the procedure including the risks, benefits and alternatives for the proposed anesthesia with the patient or authorized representative who has indicated his/her understanding and acceptance.   Dental advisory given  Plan Discussed with: CRNA  Anesthesia Plan Comments:         Anesthesia Quick Evaluation

## 2016-10-15 NOTE — Anesthesia Postprocedure Evaluation (Signed)
Anesthesia Post Note  Patient: Hunter Porter  Procedure(s) Performed: Procedure(s) (LRB): TOTAL KNEE ARTHROPLASTY (Left)     Patient location during evaluation: PACU Anesthesia Type: Spinal Level of consciousness: oriented and awake and alert Pain management: pain level controlled Vital Signs Assessment: post-procedure vital signs reviewed and stable Respiratory status: spontaneous breathing, respiratory function stable and patient connected to nasal cannula oxygen Cardiovascular status: blood pressure returned to baseline and stable Postop Assessment: no headache and no backache Anesthetic complications: no    Last Vitals:  Vitals:   10/15/16 1420 10/15/16 1440  BP: 126/79 136/80  Pulse: 60 62  Resp: 18 18  Temp: 36.6 C 36.5 C  SpO2: 98% 96%    Last Pain:  Vitals:   10/15/16 1405  TempSrc:   PainSc: 0-No pain                 Geniya Fulgham,JAMES TERRILL

## 2016-10-15 NOTE — Discharge Instructions (Signed)

## 2016-10-15 NOTE — Anesthesia Procedure Notes (Signed)
Spinal  Patient location during procedure: OR Start time: 10/15/2016 10:47 AM End time: 10/15/2016 10:52 AM Staffing Anesthesiologist: Roderic Palau Performed: anesthesiologist  Preanesthetic Checklist Completed: patient identified, surgical consent, pre-op evaluation, timeout performed, IV checked, risks and benefits discussed and monitors and equipment checked Spinal Block Patient position: sitting Prep: ChloraPrep Patient monitoring: cardiac monitor, continuous pulse ox and blood pressure Approach: midline Location: L3-4 Injection technique: single-shot Needle Needle type: Pencan  Needle gauge: 24 G Needle length: 9 cm Assessment Sensory level: T8 Additional Notes Functioning IV was confirmed and monitors were applied. Sterile prep and drape, including hand hygiene and sterile gloves were used. The patient was positioned and the spine was prepped. The skin was anesthetized with lidocaine.  Free flow of clear CSF was obtained prior to injecting local anesthetic into the CSF.  The spinal needle aspirated freely following injection.  The needle was carefully withdrawn.  The patient tolerated the procedure well.

## 2016-10-16 ENCOUNTER — Encounter (HOSPITAL_COMMUNITY): Payer: Self-pay | Admitting: Orthopedic Surgery

## 2016-10-16 DIAGNOSIS — M1712 Unilateral primary osteoarthritis, left knee: Secondary | ICD-10-CM | POA: Diagnosis not present

## 2016-10-16 MED ORDER — NICOTINE 7 MG/24HR TD PT24
7.0000 mg | MEDICATED_PATCH | Freq: Every day | TRANSDERMAL | Status: DC | PRN
  Filled 2016-10-16: qty 1

## 2016-10-16 NOTE — Progress Notes (Signed)
Pt discharge education and instructions completed with pt and son at bedside; all voices understanding and denies any questions. Pt IV removed; pt discharge home with son to transport him home. Pt handed his prescriptions for aspirin, baclofen, colace, Zofran and Roxicet. Pt left knee incision dsg remains clean, dry and intact with no stain or active bleeding noted. Pt transported off unit via wheelchair with son and belongings to the side. Delia Heady RN

## 2016-10-16 NOTE — Progress Notes (Signed)
Physical Therapy Treatment Patient Details Name: Hunter Porter MRN: [id number] DOB: April 27, 1945 Today's Date: 10/16/2016    History of Present Illness Pt is a 71 yo male admitted with L knee pain secondary to end stage knee OA. s/p L TKA 10/15/16.  PMH includes prostate cancer s/p surgery / radiation treatment, daily etoh use (4-6 beers / day), GERD, COPD, OSA - has not used CPAP in recent past, and Graves disease       PT Comments    Patient tolerated gait and stair training well this session. Overall pt required min guard/supervision for safety with OOB mobility. Current plan remains appropriate.    Follow Up Recommendations  Home health PT;Supervision/Assistance - 24 hour     Equipment Recommendations  None recommended by PT    Recommendations for Other Services       Precautions / Restrictions Precautions Precautions: Knee Precaution Comments: reviewed precautions/positioning with pt Restrictions Weight Bearing Restrictions: Yes LLE Weight Bearing: Weight bearing as tolerated    Mobility  Bed Mobility Overal bed mobility: Modified Independent Bed Mobility: Supine to Sit           General bed mobility comments: increased time and effort  Transfers Overall transfer level: Needs assistance Equipment used: Rolling walker (2 wheeled) Transfers: Sit to/from Stand Sit to Stand: Supervision         General transfer comment: cues for technique for safer descent to surface  Ambulation/Gait Ambulation/Gait assistance: Min guard;Supervision Ambulation Distance (Feet): 220 Feet Assistive device: Rolling walker (2 wheeled) Gait Pattern/deviations: Step-through pattern;Antalgic;Decreased stance time - left;Decreased step length - right;Decreased weight shift to left;Decreased dorsiflexion - left Gait velocity: decreased   General Gait Details: vc for safe use of AD, L heel strike and multimodal cues for L quad activation during stance phase; improving with  weightbearing   Stairs Stairs: Yes   Stair Management: One rail Left;Sideways;Step to pattern Number of Stairs: 4 General stair comments: cues for sequencing and technique; min guard for safety  Wheelchair Mobility    Modified Rankin (Stroke Patients Only)       Balance Overall balance assessment: Needs assistance Sitting-balance support: No upper extremity supported;Feet supported Sitting balance-Leahy Scale: Good     Standing balance support: No upper extremity supported Standing balance-Leahy Scale: Fair Standing balance comment: able to static stand without UE support                            Cognition Arousal/Alertness: Awake/alert Behavior During Therapy: WFL for tasks assessed/performed Overall Cognitive Status: Within Functional Limits for tasks assessed                                 General Comments: Pt reports "I feel so sleepy"-suspect due to pain meds      Exercises Total Joint Exercises Long Arc Quad: AROM;Left;10 reps;Seated    General Comments        Pertinent Vitals/Pain Pain Assessment: 0-10 Pain Score: 4  Pain Location: L knee Pain Descriptors / Indicators: Aching;Guarding;Sore Pain Intervention(s): Monitored during session;Premedicated before session;Repositioned;RN gave pain meds during session    [place] expects to be discharged to:: Private residence Living Arrangements: Children Available Help at Discharge: Family;Available PRN/intermittently Type of Home: House Home Access: Stairs to enter Entrance Stairs-Rails: Right;Left Home Layout: Two level;Able to live on main level with bedroom/bathroom Home Equipment: Gilford Rile - 2 wheels;Bedside commode;Shower seat -  built in;Shower seat;Grab bars - tub/shower;Hand held shower head      Prior Function Level of Independence: Independent      Comments: community ambulator and driver   PT Goals (current goals can now be found in the care plan  section) Acute Rehab PT Goals Patient Stated Goal: get back to playing golf PT Goal Formulation: With patient Time For Goal Achievement: 10/22/16 Potential to Achieve Goals: Good Progress towards PT goals: Progressing toward goals    Frequency    7X/week      PT Plan Current plan remains appropriate    Co-evaluation              AM-PAC PT "6 Clicks" Daily Activity  Outcome Measure  Difficulty turning over in bed (including adjusting bedclothes, sheets and blankets)?: A Little Difficulty moving from lying on back to sitting on the side of the bed? : A Lot Difficulty sitting down on and standing up from a chair with arms (e.g., wheelchair, bedside commode, etc,.)?: Unable Help needed moving to and from a bed to chair (including a wheelchair)?: A Little Help needed walking in hospital room?: A Little Help needed climbing 3-5 steps with a railing? : A Little 6 Click Score: 15    End of Session Equipment Utilized During Treatment: Gait belt Activity Tolerance: Patient tolerated treatment well Patient left: with call bell/phone within reach;in chair Nurse Communication: Mobility status PT Visit Diagnosis: Other abnormalities of gait and mobility (R26.89);Muscle weakness (generalized) (M62.81);Other (comment);Difficulty in walking, not elsewhere classified (R26.2)     Time: 6599-3570 PT Time Calculation (min) (ACUTE ONLY): 28 min  Charges:  $Gait Training: 23-37 mins                    G Codes:       Hunter Porter, PTA Pager: [phone]     Darliss Cheney 10/16/2016, 1:56 PM

## 2016-10-16 NOTE — Care Management Note (Addendum)
Case Management Note  Patient Details  Name: Hunter Porter MRN: [id number] Date of Birth: 1945-11-18  Subjective/Objective:   From home with son, pta indep, he has a CPM machine at home, he has a rolling walker and a four prong cane.   He has PCP and medication coverage.  His son will be transporting him home today, he [facility] from [facility]. Referral made to Santiago Glad for [place] will begin 24-48 hrs post dc. He is for dc today.    8/23 [place], BSN - NCM received call  From [facility] with [facility], that patient was preset up with [facility] at [facility], NCM called [facility]  And spoke with Santiago Glad to let her know she stated ok she will let the office know .  NCM called Stanton Kidney with [facility] to let her know, so [facility] will be seeing the patient. NCM also informed the patient that [facility] will be coming out to see him.                  Action/Plan:   Expected Discharge Date:  10/16/16               Expected Discharge Plan:  [place]  In-House Referral:     Discharge planning Services  CM Consult  Post Acute Care Choice:  Home Health Choice offered to:  Patient  DME Arranged:    DME Agency:     HH Arranged:  PT [place]:  [place]  Status of Service:  Completed, signed off  If discussed at [place] of Stay Meetings, dates discussed:    Additional Comments:  Zenon Mayo, RN 10/16/2016, 11:18 AM

## 2016-10-16 NOTE — Care Management CC44 (Signed)
Condition Code 44 Documentation Completed  Patient Details  Name: Hunter Porter MRN: [id number] Date of Birth: 01/13/1946   Condition Code 44 given:  Yes Patient signature on Condition Code 44 notice:  Yes Documentation of 2 MD's agreement:  Yes Code 44 added to claim:  Yes    Zenon Mayo, RN 10/16/2016, 12:04 PM

## 2016-10-16 NOTE — Evaluation (Signed)
Occupational Therapy Evaluation and Discharge  Patient Details Name: Hunter Porter MRN: [id number] DOB: 04/30/1945 Today's Date: 10/16/2016    History of Present Illness Pt is a 71 yo male admitted with L knee pain secondary to end stage knee OA. s/p L TKA 10/15/16.  PMH includes prostate cancer s/p surgery / radiation treatment, daily etoh use (4-6 beers / day), GERD, COPD, OSA - has not used CPAP in recent past, and Graves disease      Clinical Impression   Pt reports he was independent with ADL PTA. Currently pt overall supervision for ADL and functional mobility. All knee, safety, and ADL education completed with pt. Pt planning to d/c home with intermittent supervision from family. No further acute OT needs identified; signing off at this time. Please re-consult if needs change. Thank you for this referral.    Follow Up Recommendations  No OT follow up;Supervision - Intermittent    Equipment Recommendations  None recommended by OT    Recommendations for Other Services       Precautions / Restrictions Precautions Precautions: Knee Precaution Comments: Reviewed no pillow under knee Restrictions Weight Bearing Restrictions: Yes LLE Weight Bearing: Weight bearing as tolerated      Mobility Bed Mobility Overal bed mobility: Modified Independent            General bed mobility comments: HOB slightly elevated with use of bed rail. No physical assist. Increased time  Transfers Overall transfer level: Needs assistance Equipment used: Rolling walker (2 wheeled) Transfers: Sit to/from Stand Sit to Stand: Supervision         General transfer comment: Good hand placement and technique, increased time and effort for sit>stand EOB x1    Balance Overall balance assessment: Needs assistance Sitting-balance support: Feet supported;No upper extremity supported Sitting balance-Leahy Scale: Good     Standing balance support: Bilateral upper extremity supported;During  functional activity Standing balance-Leahy Scale: Poor Standing balance comment: RW for support                           ADL either performed or assessed with clinical judgement   ADL Overall ADL's : Needs assistance/impaired Eating/Feeding: Set up;Sitting   Grooming: Supervision/safety;Standing   Upper Body Bathing: Set up;Sitting   Lower Body Bathing: Supervison/ safety;Sit to/from stand   Upper Body Dressing : Set up;Sitting   Lower Body Dressing: Supervision/safety;Sit to/from stand Lower Body Dressing Details (indicate cue type and reason): Educated on compensatory strategies for LB ADL; pt verbalized understanding Toilet Transfer: Supervision/safety;Ambulation;BSC;RW Toilet Transfer Details (indicate cue type and reason): Educated on use of 3 in 1 over toilet      Tub/ Shower Transfer: Supervision/safety;Walk-in shower;Ambulation;Rolling walker;Shower Scientist, research (medical) Details (indicate cue type and reason): Educated on proper technique for shower transfer; pt able to return demo with supervision. Recommend use of shower chair for safety with bathing Functional mobility during ADLs: Supervision/safety;Rolling walker       Vision         Perception     Praxis      Pertinent Vitals/Pain Pain Assessment: 0-10 Pain Score: 3  Faces Pain Scale: Hurts little more Pain Location: L knee Pain Descriptors / Indicators: Aching;Sore Pain Intervention(s): Monitored during session;Repositioned;Ice applied     Hand Dominance Right   Extremity/Trunk Assessment Upper Extremity Assessment Upper Extremity Assessment: Overall WFL for tasks assessed   Lower Extremity Assessment Lower Extremity Assessment: Defer to PT evaluation   Cervical / Trunk Assessment  Cervical / Trunk Assessment: Normal   Communication Communication Communication: No difficulties   Cognition Arousal/Alertness: Awake/alert Behavior During Therapy: WFL for tasks  assessed/performed Overall Cognitive Status: Within Functional Limits for tasks assessed                                 General Comments: Pt reports "I feel so sleepy"-suspect due to pain meds   General Comments       Exercises    Shoulder Instructions      Home Living Family/patient expects to be discharged to:: Private residence Living Arrangements: Children Available Help at Discharge: Family;Available PRN/intermittently Type of Home: House Home Access: Stairs to enter [employer] of Steps: 4 Entrance Stairs-Rails: Right;Left Home Layout: Two level;Able to live on main level with bedroom/bathroom     Bathroom Shower/Tub: Occupational psychologist: Standard     Home Equipment: Environmental consultant - 2 wheels;Bedside commode;Shower seat - built in;Shower seat;Grab bars - tub/shower;Hand held shower head          Prior Functioning/Environment Level of Independence: Independent        Comments: Advertising copywriter        OT Problem List:        OT Treatment/Interventions:      OT Goals(Current goals can be found in the care plan section) Acute Rehab OT Goals Patient Stated Goal: get back to playing golf OT Goal Formulation: All assessment and education complete, DC therapy  OT Frequency:     Barriers to D/C:            Co-evaluation              AM-PAC PT "6 Clicks" Daily Activity     Outcome Measure Help from another person eating meals?: None Help from another person taking care of personal grooming?: A Little Help from another person toileting, which includes using toliet, bedpan, or urinal?: A Little Help from another person bathing (including washing, rinsing, drying)?: A Little Help from another person to put on and taking off regular upper body clothing?: None Help from another person to put on and taking off regular lower body clothing?: A Little 6 Click Score: 20   End of Session Equipment Utilized  During Treatment: Rolling walker CPM Left Knee CPM Left Knee: Off  Activity Tolerance: Patient tolerated treatment well Patient left: in bed;with call bell/phone within reach  OT Visit Diagnosis: Other abnormalities of gait and mobility (R26.89)                Time: 5732-2025 OT Time Calculation (min): 17 min Charges:  OT General Charges $OT Visit: 1 Procedure OT Evaluation $OT Eval Moderate Complexity: 1 Procedure G-Codes: OT G-codes **NOT FOR INPATIENT CLASS** Functional Assessment Tool Used: Clinical judgement Functional Limitation: Self care Self Care Current Status (K2706): At least 1 percent but less than 20 percent impaired, limited or restricted Self Care Goal Status (C3762): At least 1 percent but less than 20 percent impaired, limited or restricted Self Care Discharge Status [phone]): At least 1 percent but less than 20 percent impaired, limited or restricted   Mel Almond A. Ulice Brilliant, M.S., OTR/L Pager: [place] 10/16/2016, 11:51 AM

## 2016-10-16 NOTE — Care Management Obs Status (Signed)
[place] NOTIFICATION   Patient Details  Name: Hunter Porter MRN: [id number] Date of Birth: 1945-07-24   Medicare Observation Status Notification Given:  Yes    Zenon Mayo, RN 10/16/2016, 12:04 PM

## 2016-10-16 NOTE — Discharge Summary (Signed)
Discharge Summary  Patient ID: Hunter Porter MRN: [id number] DOB/AGE: [birth year] 71 y.o.  Admit date: 10/15/2016 Discharge date: 10/16/2016  Admission Diagnoses:  Primary osteoarthritis of left knee  Discharge Diagnoses:  Principal Problem:   Primary osteoarthritis of left knee Active Problems:   GERD (gastroesophageal reflux disease)   Thyroid disease   Hypertension   Tobacco abuse   Past Medical History:  Diagnosis Date  . Adenomatous colon polyp   . Allergy   . Anxiety   . Arthritis   . Cataract    removed bilateral  . GERD (gastroesophageal reflux disease)   . H/O hemorrhoids   . H/O hiatal hernia   . Heart murmur 2007   per pt history- possible murmur by PCp- states negative stress test  . Hyperlipidemia   . Prostate cancer ([place]) 01/20/2013  . Sleep apnea    CPAP.  Trying to use it. (NOT USING CURRENTLY )   DX YEARS AGO  . Thyroid disease    graves  . Ulcer approx 1970   HX peptic ulcer    Surgeries: Procedure(s): TOTAL KNEE ARTHROPLASTY on 10/15/2016   Consultants (if any):   Discharged Condition: Improved  Hospital Course: ARVIS ZWAHLEN is an 71 y.o. male who was admitted 10/15/2016 with a diagnosis of Primary osteoarthritis of left knee and went to the operating room on 10/15/2016 and underwent the above named procedures.    He was given perioperative antibiotics:  Anti-infectives    Start     Dose/Rate Route Frequency Ordered Stop   10/15/16 0944  ceFAZolin (ANCEF) 2-4 GM/100ML-% IVPB    Comments:  Leandrew Koyanagi   : cabinet override      10/15/16 0944 10/15/16 2159    .  He was given sequential compression devices, early ambulation, and Aspirin for DVT prophylaxis.  He benefited maximally from the hospital stay and there were no complications.    Recent vital signs:  Vitals:   10/16/16 0018 10/16/16 0505  BP: (!) 113/59 115/64  Pulse: 71 60  Resp: 17 17  Temp: 99.7 F (37.6 C) 98.6 F (37 C)  SpO2: 95% 97%    Recent laboratory  studies:  Lab Results  Component Value Date   HGB 15.2 10/03/2016   HGB 14.7 08/21/2016   HGB 13.7 07/05/2016   Lab Results  Component Value Date   WBC 6.1 10/03/2016   PLT 187 10/03/2016   No results found for: INR Lab Results  Component Value Date   NA 137 10/03/2016   K 4.2 10/03/2016   CL 99 (L) 10/03/2016   CO2 31 10/03/2016   BUN 13 10/03/2016   CREATININE 0.92 10/03/2016   GLUCOSE 99 10/03/2016    Discharge Medications:   Allergies as of 10/16/2016      Reactions   Bee Venom Swelling   Throat swells   Penicillins Rash, Other (See Comments)   PATIENT HAS HAD A PCN REACTION WITH IMMEDIATE RASH, FACIAL/TONGUE/THROAT SWELLING, SOB, OR LIGHTHEADEDNESS WITH HYPOTENSION:  #  #  #  YES  #  #  #   Has patient had a PCN reaction causing severe rash involving mucus membranes or skin necrosis: No Has patient had a PCN reaction that required hospitalization: No Has patient had a PCN reaction occurring within the last 10 years: No If all of the above answers are "NO", then may proceed with Cephalosporin use.   Hydrocodone-acetaminophen Nausea And Vomiting      Medication List    STOP taking  these medications   aspirin 81 MG tablet Replaced by:  aspirin EC 325 MG tablet   traMADol 50 MG tablet Commonly known as:  ULTRAM     TAKE these medications   ALPRAZolam 1 MG tablet Commonly known as:  XANAX TAKE 1/2 TABLET TWICE DAILY AS NEEDED. What changed:  See the new instructions.   aspirin EC 325 MG tablet Take 1 tablet (325 mg total) by mouth daily. For 30 days post op for DVT Prophylaxis Replaces:  aspirin 81 MG tablet   baclofen 10 MG tablet Commonly known as:  LIORESAL Take 1 tablet (10 mg total) by mouth 3 (three) times daily as needed for muscle spasms.   CAL-MAG-ZINC PO Take 1 tablet by mouth daily. What changed:  Another medication with the same name was removed. Continue taking this medication, and follow the directions you see here.   cetirizine 10 MG  tablet Commonly known as:  ZYRTEC Take 10 mg by mouth daily.   cholecalciferol 1000 units tablet Commonly known as:  VITAMIN D Take 1,000 Units by mouth daily.   clobetasol cream 0.05 % Commonly known as:  TEMOVATE Apply 1 application topically 2 (two) times daily. On feet   docusate sodium 100 MG capsule Commonly known as:  COLACE Take 1 capsule (100 mg total) by mouth 2 (two) times daily. To prevent constipation while taking pain medication.   EPINEPHrine 0.3 mg/0.3 mL Soaj injection Commonly known as:  EPI-PEN Inject 0.3 mLs (0.3 mg total) into the muscle once. For allergic reaction to bee stings   esomeprazole 20 MG capsule Commonly known as:  NEXIUM Take 20 mg by mouth daily at 12 noon. What changed:  Another medication with the same name was removed. Continue taking this medication, and follow the directions you see here.   hydrochlorothiazide 25 MG tablet Commonly known as:  HYDRODIURIL Take 1 tablet (25 mg total) by mouth daily.   multivitamin capsule Take 1 capsule by mouth daily.   naproxen 375 MG tablet Commonly known as:  NAPROSYN TAKE 1 TABLET TWICE DAILY AS NEEDED FOR PAIN.TAKE WITH FOOD. What changed:  See the new instructions.   ondansetron 4 MG tablet Commonly known as:  ZOFRAN Take 1 tablet (4 mg total) by mouth every 8 (eight) hours as needed for nausea or vomiting.   oxyCODONE-acetaminophen 5-325 MG tablet Commonly known as:  ROXICET Take 1-2 tablets by mouth every 4 (four) hours as needed for severe pain.   Potassium 99 MG Tabs Take 99 mg by mouth daily.   SYSTANE ULTRA PF 0.4-0.3 % Soln Generic drug:  Polyethyl Glyc-Propyl Glyc PF Place 1 drop into both eyes daily as needed (for dry eyes).       Diagnostic Studies: Dg Knee Left Port  Result Date: 10/15/2016 CLINICAL DATA:  Left knee replacement EXAM: PORTABLE LEFT KNEE - 1-2 VIEW COMPARISON:  None. FINDINGS: Changes of left knee replacement. No hardware bony complicating feature. Soft  tissue and joint space gas present. IMPRESSION: Left knee replacement.  No complicating feature. Electronically Signed   By: Rolm Baptise M.D.   On: 10/15/2016 14:01    Disposition: 01-Home or Self Care    Follow-up Information    Renette Butters, MD Follow up.   Specialty:  Orthopedic Surgery Contact information: [address] [place] [address] [phone]            Signed: Prudencio Burly III PA-C 10/16/2016, 7:01 AM

## 2016-10-16 NOTE — Progress Notes (Signed)
   Assessment / Plan: 1 Day Post-Op  S/P Procedure(s) (LRB): TOTAL KNEE ARTHROPLASTY (Left) by Dr. Ernesta Amble. Percell Miller on 10/15/16  Principal Problem:   Primary osteoarthritis of left knee Active Problems:   GERD (gastroesophageal reflux disease)   Thyroid disease   Hypertension   Tobacco abuse   Up with therapy D/C IV fluids Incentive Spirometry Elevate and apply ice  Weight Bearing: Weight Bearing as Tolerated (WBAT)  Dressings: maintain.  VTE prophylaxis: Aspirin, SCDs, ambulation Dispo: Home today pending therapy evaluation and pain control  Subjective: Patient reports pain as moderate. Pain controlled with IV and PO meds.  Tolerating diet.  Urinating.  +Flatus.  No CP, SOB.  OOB in room.  Objective:   VITALS:   Vitals:   10/15/16 1440 10/15/16 2023 10/16/16 0018 10/16/16 0505  BP: 136/80 138/68 (!) 113/59 115/64  Pulse: 62 71 71 60  Resp: 18 17 17 17   Temp: 97.7 F (36.5 C) 97.7 F (36.5 C) 99.7 F (37.6 C) 98.6 F (37 C)  TempSrc:  Oral Oral Oral  SpO2: 96% 96% 95% 97%   CBC Latest Ref Rng & Units 10/03/2016 08/21/2016 07/05/2016  WBC 4.0 - 10.5 K/uL 6.1 5.9 10.5  Hemoglobin 13.0 - 17.0 g/dL 15.2 14.7 13.7  Hematocrit 39.0 - 52.0 % 43.6 43.4 40.4  Platelets 150 - 400 K/uL 187 202 259   BMP Latest Ref Rng & Units 10/03/2016 08/21/2016 07/05/2016  Glucose 65 - 99 mg/dL 99 97 99  BUN 6 - 20 mg/dL 13 19 14   Creatinine 0.61 - 1.24 mg/dL 0.92 0.83 0.87  Sodium 135 - 145 mmol/L 137 141 135  Potassium 3.5 - 5.1 mmol/L 4.2 4.3 3.6  Chloride 101 - 111 mmol/L 99(L) 103 94(L)  CO2 22 - 32 mmol/L 31 26 31   Calcium 8.9 - 10.3 mg/dL 9.1 9.0 8.8   Intake/Output      08/21 0701 - 08/22 0700   P.O. 480   I.V. 1615.5   Total Intake 2095.5   Urine 500   Blood 30   Total Output 530   Net +1565.5       Urine Occurrence 1 x      Physical Exam: General: NAD.  Upright in bed.  Calm, conversant.  No increased wob. ABD soft Neurologically intact MSK Neurovascularly  intact Sensation intact distally Feet warm Dorsiflexion/Plantar flexion intact Incision: dressing C/D/I   Prudencio Burly III, PA-C 10/16/2016, 6:59 AM

## 2016-10-16 NOTE — Progress Notes (Signed)
Physical Therapy Treatment Patient Details Name: Hunter Porter MRN: [id number] DOB: 1945/06/20 Today's Date: 10/16/2016    History of Present Illness Pt is a 71 yo male admitted with L knee pain secondary to end stage knee OA. s/p L TKA 10/15/16.  PMH includes prostate cancer s/p surgery / radiation treatment, daily etoh use (4-6 beers / day), GERD, COPD, OSA - has not used CPAP in recent past, and Graves disease       PT Comments    Patient is making progress toward mobility goals. Patient needs to practice stairs next session.      Follow Up Recommendations  Home health PT;Supervision/Assistance - 24 hour     Equipment Recommendations  None recommended by PT    Recommendations for Other Services       Precautions / Restrictions Precautions Precautions: Knee Precaution Comments: reviewed precautions/positioning with pt Restrictions Weight Bearing Restrictions: Yes LLE Weight Bearing: Weight bearing as tolerated    Mobility  Bed Mobility Overal bed mobility: Modified Independent Bed Mobility: Supine to Sit           General bed mobility comments: increased time and effort  Transfers Overall transfer level: Needs assistance Equipment used: Rolling walker (2 wheeled) Transfers: Sit to/from Stand Sit to Stand: Min guard         General transfer comment: min guard for safety; cues for safe hand placement and technique  Ambulation/Gait Ambulation/Gait assistance: Min guard Ambulation Distance (Feet): 150 Feet Assistive device: Rolling walker (2 wheeled) Gait Pattern/deviations: Step-through pattern;Antalgic;Decreased stance time - left;Decreased step length - right;Decreased weight shift to left;Decreased dorsiflexion - left Gait velocity: decreased   General Gait Details: heavy reliance on RW for support; cues for L heel strike and engagement of L quad during stance phase; pt maintained slightly flexed L knee throughout    Stairs             Wheelchair Mobility    Modified Rankin (Stroke Patients Only)       Balance Overall balance assessment: Needs assistance Sitting-balance support: No upper extremity supported;Feet supported Sitting balance-Leahy Scale: Good     Standing balance support: Single extremity supported Standing balance-Leahy Scale: Fair                              Cognition Arousal/Alertness: Awake/alert Behavior During Therapy: WFL for tasks assessed/performed Overall Cognitive Status: Within Functional Limits for tasks assessed                                        Exercises Total Joint Exercises Quad Sets: AROM;Left;10 reps;Supine Heel Slides: AROM;10 reps;Supine;Left Hip ABduction/ADduction: AROM;Left;10 reps Straight Leg Raises: AROM;Left;10 reps Goniometric ROM: 10-85    General Comments        Pertinent Vitals/Pain Pain Assessment: Faces Faces Pain Scale: Hurts little more Pain Location: L knee Pain Descriptors / Indicators: Aching;Guarding;Sore Pain Intervention(s): Limited activity within patient's tolerance;Monitored during session;Premedicated before session;Repositioned    Home Living                      Prior Function            PT Goals (current goals can now be found in the care plan section) Acute Rehab PT Goals Patient Stated Goal: get back to playing golf PT Goal Formulation: With patient Time For  Goal Achievement: 10/22/16 Potential to Achieve Goals: Good Progress towards PT goals: Progressing toward goals    Frequency    7X/week      PT Plan Current plan remains appropriate    Co-evaluation              AM-PAC PT "6 Clicks" Daily Activity  Outcome Measure  Difficulty turning over in bed (including adjusting bedclothes, sheets and blankets)?: A Little Difficulty moving from lying on back to sitting on the side of the bed? : A Lot Difficulty sitting down on and standing up from a chair with arms  (e.g., wheelchair, bedside commode, etc,.)?: Unable Help needed moving to and from a bed to chair (including a wheelchair)?: A Little Help needed walking in hospital room?: A Little Help needed climbing 3-5 steps with a railing? : A Lot 6 Click Score: 14    End of Session Equipment Utilized During Treatment: Gait belt Activity Tolerance: Patient tolerated treatment well Patient left: with call bell/phone within reach (pt in bathroom ) Nurse Communication: Mobility status PT Visit Diagnosis: Other abnormalities of gait and mobility (R26.89);Muscle weakness (generalized) (M62.81);Other (comment);Difficulty in walking, not elsewhere classified (R26.2)     Time: 4854-6270 PT Time Calculation (min) (ACUTE ONLY): 26 min  Charges:  $Gait Training: 8-22 mins $Therapeutic Exercise: 8-22 mins                    G Codes:       Earney Navy, PTA Pager: [phone]     Darliss Cheney 10/16/2016, 10:00 AM

## 2016-10-22 NOTE — Progress Notes (Signed)
  Previously Omitted G-codes    2016/10/25 1730  PT G-Codes **NOT FOR INPATIENT CLASS**  Functional Assessment Tool Used AM-PAC 6 Clicks Basic Mobility  Functional Limitation Mobility: Walking and moving around  Mobility: Walking and Moving Around Current Status (Q1901) CK  Mobility: Walking and Moving Around Goal Status [phone]) CI   Benjamine Mola B. Migdalia Dk PT, DPT Acute Rehabilitation  [phone] Pager [phone]

## 2016-10-24 ENCOUNTER — Other Ambulatory Visit: Payer: Self-pay | Admitting: Physician Assistant

## 2016-10-24 DIAGNOSIS — I10 Essential (primary) hypertension: Secondary | ICD-10-CM

## 2016-10-24 DIAGNOSIS — R600 Localized edema: Secondary | ICD-10-CM

## 2016-11-27 ENCOUNTER — Ambulatory Visit (INDEPENDENT_AMBULATORY_CARE_PROVIDER_SITE_OTHER): Payer: Medicare Other | Admitting: Physician Assistant

## 2016-11-27 ENCOUNTER — Encounter: Payer: Self-pay | Admitting: Physician Assistant

## 2016-11-27 VITALS — BP 116/68 | HR 70 | Temp 98.5°F | Resp 16 | Ht 68.0 in | Wt 208.0 lb

## 2016-11-27 DIAGNOSIS — I10 Essential (primary) hypertension: Secondary | ICD-10-CM

## 2016-11-27 DIAGNOSIS — F419 Anxiety disorder, unspecified: Secondary | ICD-10-CM

## 2016-11-27 DIAGNOSIS — Z1159 Encounter for screening for other viral diseases: Secondary | ICD-10-CM

## 2016-11-27 DIAGNOSIS — K219 Gastro-esophageal reflux disease without esophagitis: Secondary | ICD-10-CM

## 2016-11-27 DIAGNOSIS — Z23 Encounter for immunization: Secondary | ICD-10-CM

## 2016-11-27 DIAGNOSIS — R600 Localized edema: Secondary | ICD-10-CM

## 2016-11-27 MED ORDER — HYDROCHLOROTHIAZIDE 25 MG PO TABS: ORAL_TABLET | ORAL | 1 refills | Status: DC

## 2016-11-27 NOTE — Progress Notes (Addendum)
Patient ID: Hunter Porter MRN: [id number], DOB: November 11, 1945, 71 y.o. Date of Encounter: 11/27/2016, 11:58 AM    Chief Complaint:  Chief Complaint  Patient presents with  . Follow-up    is fasting     HPI: 71 y.o. year old male presents for routine f/u OV.   He recently had surgery to his left knee by Dr. Fredonia Highland on 10/15/16.  He had seen me for preop visit and CPE on 08/21/16.  Today he states that he is doing pretty well in regards to the knee surgery. He has no specific concerns to address today.  He is taking the HCTZ daily. Having no lightheadedness. No lower extremity edema. He is taking Nexium for GERD and this is controlling those symptoms. He uses Xanax as needed for anxiety. Uses low dose. This controls his symptoms and is causing no adverse effects.  I asked if he is using his CPAP for his sleep apnea. He states that his leg was bothering him so much and he wasn't getting good sleep because of that that he knew he certainly wouldn't sleep if he also was wearing a CPAP machine. However says that now that his leg is getting better he does need to add using the CPAP. Encouraged him to do so.  ADDENDUM ADDED 01/13/2017:  I just received records from the [place]. Includes Radiology reports, Lab reports-- as well as Progress Notes.  I sent these to scan.    Home Meds:   Outpatient Medications Prior to Visit  Medication Sig Dispense Refill  . ALPRAZolam (XANAX) 1 MG tablet TAKE 1/2 TABLET TWICE DAILY AS NEEDED. (Patient taking differently: TAKE 1/4 TABLET TWICE DAILY AS NEEDED ANXIETY) 30 tablet 0  . aspirin EC 325 MG tablet Take 1 tablet (325 mg total) by mouth daily. For 30 days post op for DVT Prophylaxis 30 tablet 0  . Calcium-Magnesium-Zinc (CAL-MAG-ZINC PO) Take 1 tablet by mouth daily.    . cetirizine (ZYRTEC) 10 MG tablet Take 10 mg by mouth daily.    . cholecalciferol (VITAMIN D) 1000 units tablet Take 1,000 Units by mouth daily.    . clobetasol cream  (TEMOVATE) 7.01 % Apply 1 application topically 2 (two) times daily. On feet    . EPINEPHrine 0.3 mg/0.3 mL IJ SOAJ injection Inject 0.3 mLs (0.3 mg total) into the muscle once. For allergic reaction to bee stings 1 Device 3  . esomeprazole (NEXIUM) 20 MG capsule Take 20 mg by mouth daily at 12 noon.    . Multiple Vitamin (MULTIVITAMIN) capsule Take 1 capsule by mouth daily.    . naproxen (NAPROSYN) 375 MG tablet TAKE 1 TABLET TWICE DAILY AS NEEDED FOR PAIN.TAKE WITH FOOD. 60 tablet 0  . Polyethyl Glyc-Propyl Glyc PF (SYSTANE ULTRA PF) 0.4-0.3 % SOLN Place 1 drop into Porter eyes daily as needed (for dry eyes).    . Potassium 99 MG TABS Take 99 mg by mouth daily.     . hydrochlorothiazide (HYDRODIURIL) 25 MG tablet TAKE (1) TABLET BY MOUTH ONCE DAILY. 30 tablet 0  . baclofen (LIORESAL) 10 MG tablet Take 1 tablet (10 mg total) by mouth 3 (three) times daily as needed for muscle spasms. (Patient not taking: Reported on 11/27/2016) 40 each 0  . docusate sodium (COLACE) 100 MG capsule Take 1 capsule (100 mg total) by mouth 2 (two) times daily. To prevent constipation while taking pain medication. (Patient not taking: Reported on 11/27/2016) 60 capsule 0  . ondansetron (ZOFRAN) 4 MG  tablet Take 1 tablet (4 mg total) by mouth every 8 (eight) hours as needed for nausea or vomiting. (Patient not taking: Reported on 11/27/2016) 40 tablet 0  . oxyCODONE-acetaminophen (ROXICET) 5-325 MG tablet Take 1-2 tablets by mouth every 4 (four) hours as needed for severe pain. (Patient not taking: Reported on 11/27/2016) 60 tablet 0   Facility-Administered Medications Prior to Visit  Medication Dose Route Frequency Provider Last Rate Last Dose  . 0.9 %  sodium chloride infusion  500 mL Intravenous Continuous Ladene Artist, MD        Allergies:  Allergies  Allergen Reactions  . Bee Venom Swelling    Throat swells   . Penicillins Rash and Other (See Comments)    PATIENT HAS HAD A PCN REACTION WITH IMMEDIATE RASH,  FACIAL/TONGUE/THROAT SWELLING, SOB, OR LIGHTHEADEDNESS WITH HYPOTENSION:  #  #  #  YES  #  #  #   Has patient had a PCN reaction causing severe rash involving mucus membranes or skin necrosis: No Has patient had a PCN reaction that required hospitalization: No Has patient had a PCN reaction occurring within the last 10 years: No If all of the above answers are "NO", then may proceed with Cephalosporin use.   Marland Kitchen Hydrocodone-Acetaminophen Nausea And Vomiting      Review of Systems: See HPI for pertinent ROS. All other ROS negative.    Physical Exam: Blood pressure 116/68, pulse 70, temperature 98.5 F (36.9 C), temperature source Oral, resp. rate 16, height 5\' 8"  (1.727 m), weight 94.3 kg (208 lb), SpO2 98 %., Body mass index is 31.63 kg/m. General: WM.  Appears in no acute distress. Neck: Supple. No thyromegaly. No lymphadenopathy. No carotid bruits. Lungs: Clear bilaterally to auscultation without wheezes, rales, or rhonchi. Breathing is unlabored. Heart: Regular rhythm. No murmurs, rubs, or gallops. Abdomen: Soft, non-tender, non-distended with normoactive bowel sounds. No hepatomegaly. No rebound/guarding. No obvious abdominal masses. Msk:  Strength and tone normal for age. Extremities/Skin: Warm and dry. No LE edema.  Neuro: Alert and oriented X 3. Moves all extremities spontaneously. Gait is normal. CNII-XII grossly in tact. Psych:  Responds to questions appropriately with a normal affect.     ASSESSMENT AND PLAN:  71 y.o. year old male with  1. Essential hypertension Blood pressure is well controlled. He has no lower extremity edema. Will continue HCTZ 25 mg daily. Check lab to monitor. - BASIC METABOLIC PANEL WITH GFR - hydrochlorothiazide (HYDRODIURIL) 25 MG tablet; TAKE (1) TABLET BY MOUTH ONCE DAILY.  Dispense: 90 tablet; Refill: 1  2. Gastroesophageal reflux disease, esophagitis presence not specified Stable/controlled with Nexium. Continue current treatment the  same.  3. Need for hepatitis C screening test - Hepatitis C antibody  4. Anxiety Continue Low-dose Xanax as needed.  5. Lower extremity edema Blood pressure is well controlled. He has no lower extremity edema. Will continue HCTZ 25 mg daily. Check lab to monitor. - hydrochlorothiazide (HYDRODIURIL) 25 MG tablet; TAKE (1) TABLET BY MOUTH ONCE DAILY.  Dispense: 90 tablet; Refill: 1  I asked if he is using his CPAP for his sleep apnea. He states that his leg was bothering him so much and he wasn't getting good sleep because of that that he knew he certainly wouldn't sleep if he also was wearing a CPAP machine. However says that now that his leg is getting better he does need to add using the CPAP. Encouraged him to do so.    For preventive care see his CPE  note 08/21/16.    ADDENDUM ADDED 01/13/2017:  I just received records from the [place]. Includes Radiology reports, Lab reports-- as well as Progress Notes.  I sent these to scan.  Signed, [address] [place], [place], [facility] 11/27/2016 11:58 AM

## 2016-11-28 ENCOUNTER — Other Ambulatory Visit: Payer: Self-pay | Admitting: Physician Assistant

## 2016-11-28 ENCOUNTER — Encounter: Payer: Self-pay | Admitting: *Deleted

## 2016-11-28 LAB — BASIC METABOLIC PANEL WITH GFR
BUN: 14 mg/dL (ref 7–25)
CO2: 27 mmol/L (ref 20–32)
CREATININE: 0.85 mg/dL (ref 0.70–1.18)
Calcium: 9.3 mg/dL (ref 8.6–10.3)
Chloride: 100 mmol/L (ref 98–110)
GFR, EST AFRICAN AMERICAN: 102 mL/min/{1.73_m2} (ref >=60)
GFR, Est Non African American: 88 mL/min/{1.73_m2} (ref >=60)
Glucose, Bld: 104 mg/dL — ABNORMAL HIGH (ref 65–99)
Potassium: 4 mmol/L (ref 3.5–5.3)
Sodium: 138 mmol/L (ref 135–146)

## 2016-11-28 LAB — HEPATITIS C ANTIBODY
HEP C AB: NONREACTIVE
SIGNAL TO CUT-OFF: 0.03 (ref <1.00)

## 2016-12-23 ENCOUNTER — Other Ambulatory Visit: Payer: Self-pay | Admitting: Physician Assistant

## 2016-12-23 DIAGNOSIS — G4700 Insomnia, unspecified: Secondary | ICD-10-CM

## 2016-12-24 NOTE — Telephone Encounter (Signed)
Xanax called in 

## 2016-12-24 NOTE — Telephone Encounter (Signed)
Last OV 11/27/2016 Last refill 10/05/2015 Ok to refill xanax?

## 2016-12-24 NOTE — Telephone Encounter (Signed)
Renal function normal at recent lab.  Ok to refill naproxen for # 60 +5 Alprazolam approved or # 30 +2

## 2017-02-27 ENCOUNTER — Encounter: Payer: Medicare Other | Admitting: Physician Assistant

## 2017-03-26 ENCOUNTER — Ambulatory Visit (INDEPENDENT_AMBULATORY_CARE_PROVIDER_SITE_OTHER): Payer: Medicare HMO | Admitting: Physician Assistant

## 2017-03-26 ENCOUNTER — Other Ambulatory Visit: Payer: Self-pay

## 2017-03-26 ENCOUNTER — Encounter: Payer: Self-pay | Admitting: Physician Assistant

## 2017-03-26 VITALS — BP 124/76 | HR 57 | Temp 97.7°F | Resp 16 | Ht 66.5 in | Wt 212.8 lb

## 2017-03-26 DIAGNOSIS — K219 Gastro-esophageal reflux disease without esophagitis: Secondary | ICD-10-CM | POA: Diagnosis not present

## 2017-03-26 DIAGNOSIS — F419 Anxiety disorder, unspecified: Secondary | ICD-10-CM | POA: Diagnosis not present

## 2017-03-26 DIAGNOSIS — R69 Illness, unspecified: Secondary | ICD-10-CM | POA: Diagnosis not present

## 2017-03-26 DIAGNOSIS — Z9989 Dependence on other enabling machines and devices: Secondary | ICD-10-CM

## 2017-03-26 DIAGNOSIS — E079 Disorder of thyroid, unspecified: Secondary | ICD-10-CM | POA: Diagnosis not present

## 2017-03-26 DIAGNOSIS — I10 Essential (primary) hypertension: Secondary | ICD-10-CM

## 2017-03-26 DIAGNOSIS — R252 Cramp and spasm: Secondary | ICD-10-CM | POA: Diagnosis not present

## 2017-03-26 DIAGNOSIS — C61 Malignant neoplasm of prostate: Secondary | ICD-10-CM | POA: Diagnosis not present

## 2017-03-26 DIAGNOSIS — G4733 Obstructive sleep apnea (adult) (pediatric): Secondary | ICD-10-CM

## 2017-03-26 DIAGNOSIS — E785 Hyperlipidemia, unspecified: Secondary | ICD-10-CM | POA: Diagnosis not present

## 2017-03-26 MED ORDER — CYCLOBENZAPRINE HCL 10 MG PO TABS: 10.0000 mg | ORAL_TABLET | Freq: Every day | ORAL | 2 refills | Status: DC

## 2017-03-26 NOTE — Progress Notes (Signed)
Patient ID: Hunter Porter MRN: [id number], DOB: 1945/02/26, 72 y.o. Date of Encounter: 03/26/2017, 12:05 PM    Chief Complaint:  Chief Complaint  Patient presents with  . Handicapped Parking, Muscle cramps in left calf at night     HPI: 72 y.o. year old male presents with above.   Today I reviewed his chart.  Reviewed that he had a visit with me 08/21/16.  At that time, performed CPE and preop clearance. Also reviewed he had office visit with me 11/27/16 for routine follow-up.  Today he reports that his handicapped parking placard has expired and he is needing a new one.  Reports that he has significant neuropathy that causes his feet to feel numb and this is his biggest limiting factor to his walking.  He did have surgery to his left knee and this does cause him some pain at night when he is sleeping but says that the knee actually is not affecting him that much when he is walking. Says his  walking is limited more from the neuropathy in his feet.  Today he also reports that he frequently has cramping in his left lower leg during the night. Reports that his left knee hurts a lot at night and he is not getting good sleep because of this. States that he has not been using his CPAP since his knee surgery because he already is not sleeping good because of the left knee and knows that his sleep would be even worse with trying to also use the CPAP machine as well.  Reviewed that Xanax is on his medication list to use as needed for anxiety but he states that he very rarely uses that and has not taking any in the past month. Also reviewed the baclofen was on his medicine list but he states that he has not been using that and only has 1 or 2 of those pills remaining in the bottle.  Has continued to go to the [place] for some of his medical care.  He states that the [place] prescribes the pravastatin and they have checked labs to monitor that.  Reports he continues to take the HCTZ 25 mg daily. Reports  that he is taking the Nexium on a daily basis.  If he does not take the medication then has GERD symptoms.       Home Meds:   Outpatient Medications Prior to Visit  Medication Sig Dispense Refill  . ALPRAZolam (XANAX) 1 MG tablet TAKE 1/2 TABLET TWICE DAILY AS NEEDED. 30 tablet 2  . aspirin EC 325 MG tablet Take 1 tablet (325 mg total) by mouth daily. For 30 days post op for DVT Prophylaxis 30 tablet 0  . Calcium-Magnesium-Zinc (CAL-MAG-ZINC PO) Take 1 tablet by mouth daily.    . cetirizine (ZYRTEC) 10 MG tablet Take 10 mg by mouth daily.    . cholecalciferol (VITAMIN D) 1000 units tablet Take 1,000 Units by mouth daily.    . clobetasol cream (TEMOVATE) 7.51 % Apply 1 application topically 2 (two) times daily. On feet    . EPINEPHrine 0.3 mg/0.3 mL IJ SOAJ injection Inject 0.3 mLs (0.3 mg total) into the muscle once. For allergic reaction to bee stings 1 Device 3  . esomeprazole (NEXIUM) 20 MG capsule Take 20 mg by mouth daily at 12 noon.    . hydrochlorothiazide (HYDRODIURIL) 25 MG tablet TAKE (1) TABLET BY MOUTH ONCE DAILY. 90 tablet 1  . Multiple Vitamin (MULTIVITAMIN) capsule Take 1 capsule by mouth  daily.    . naproxen (NAPROSYN) 375 MG tablet TAKE 1 TABLET TWICE DAILY AS NEEDED FOR PAIN.TAKE WITH FOOD. 60 tablet 5  . Omega-3 Fatty Acids (FISH OIL) 1200 MG CAPS Take by mouth.    Vladimir Faster Glyc-Propyl Glyc PF (SYSTANE ULTRA PF) 0.4-0.3 % SOLN Place 1 drop into both eyes daily as needed (for dry eyes).    . Potassium 99 MG TABS Take 99 mg by mouth daily.     . pravastatin (PRAVACHOL) 40 MG tablet Take 40 mg by mouth daily.    . Turmeric 1053 MG TABS Take by mouth.    . vitamin E 100 UNIT capsule Take by mouth daily.    . baclofen (LIORESAL) 10 MG tablet Take 1 tablet (10 mg total) by mouth 3 (three) times daily as needed for muscle spasms. 40 each 0   Facility-Administered Medications Prior to Visit  Medication Dose Route Frequency Provider Last Rate Last Dose  . 0.9 %  sodium  chloride infusion  500 mL Intravenous Continuous Ladene Artist, MD        Allergies:  Allergies  Allergen Reactions  . Bee Venom Swelling    Throat swells   . Penicillins Rash and Other (See Comments)    PATIENT HAS HAD A PCN REACTION WITH IMMEDIATE RASH, FACIAL/TONGUE/THROAT SWELLING, SOB, OR LIGHTHEADEDNESS WITH HYPOTENSION:  #  #  #  YES  #  #  #   Has patient had a PCN reaction causing severe rash involving mucus membranes or skin necrosis: No Has patient had a PCN reaction that required hospitalization: No Has patient had a PCN reaction occurring within the last 10 years: No If all of the above answers are "NO", then may proceed with Cephalosporin use.   Marland Kitchen Hydrocodone-Acetaminophen Nausea And Vomiting      Review of Systems: See HPI for pertinent ROS. All other ROS negative.    Physical Exam: Blood pressure 124/76, pulse (!) 57, temperature 97.7 F (36.5 C), temperature source Oral, resp. rate 16, height 5' 6.5" (1.689 m), weight 96.5 kg (212 lb 12.8 oz), SpO2 97 %., Body mass index is 33.83 kg/m. General:  WM. Appears in no acute distress. Neck: Supple. No thyromegaly. No lymphadenopathy. Lungs: Clear bilaterally to auscultation without wheezes, rales, or rhonchi. Breathing is unlabored. Heart: Regular rhythm. No murmurs, rubs, or gallops. Abdomen: Soft, non-tender, non-distended with normoactive bowel sounds. No hepatomegaly. No rebound/guarding. No obvious abdominal masses. Msk:  Strength and tone normal for age. Extremities/Skin: Warm and dry.  No edema.  Neuro: Alert and oriented X 3. Moves all extremities spontaneously. Gait is normal. CNII-XII grossly in tact. Psych:  Responds to questions appropriately with a normal affect.     ASSESSMENT AND PLAN:  72 y.o. year old male with   Essential hypertension Blood Pressure is at goal/controlled.  Continue current medication.  He had bmet 11/27/16 so can wait to repeat lab.  OSA on CPAP I recommended that he  follow-up with orthopedics regarding his continued pain in his left knee at night that is affecting his sleep.  Gastroesophageal reflux disease, esophagitis presence not specified Cont the Nexium.  This is stable and controlled with medication.  Prostate cancer [facility]) He sees urology every 6 months.  Anxiety He states that he very rarely uses the Xanax and has used 9 in the past month.  Dyslipidemia He states that the pravastatin is prescribed by the [place] and they are checking labs to monitor that medication.  Muscle cramps I  will prescribe Flexeril for him to use at night to help with the cramps in the left leg.  Also encouraged him to schedule follow-up with orthopedics regarding his left knee. - cyclobenzaprine (FLEXERIL) 10 MG tablet; Take 1 tablet (10 mg total) by mouth at bedtime.  Dispense: 30 tablet; Refill: 2   Signed, [address] [place], [place], [facility] 03/26/2017 12:05 PM

## 2017-06-02 DIAGNOSIS — C61 Malignant neoplasm of prostate: Secondary | ICD-10-CM | POA: Diagnosis not present

## 2017-06-09 DIAGNOSIS — C61 Malignant neoplasm of prostate: Secondary | ICD-10-CM | POA: Diagnosis not present

## 2017-06-09 DIAGNOSIS — N5231 Erectile dysfunction following radical prostatectomy: Secondary | ICD-10-CM | POA: Diagnosis not present

## 2017-06-26 ENCOUNTER — Other Ambulatory Visit: Payer: Self-pay | Admitting: Physician Assistant

## 2017-07-29 ENCOUNTER — Other Ambulatory Visit: Payer: Self-pay | Admitting: Physician Assistant

## 2017-08-30 ENCOUNTER — Other Ambulatory Visit: Payer: Self-pay | Admitting: Physician Assistant

## 2017-09-22 ENCOUNTER — Ambulatory Visit: Payer: Medicare HMO | Admitting: Physician Assistant

## 2017-09-24 ENCOUNTER — Encounter: Payer: Self-pay | Admitting: Physician Assistant

## 2017-09-24 ENCOUNTER — Other Ambulatory Visit: Payer: Self-pay | Admitting: Physician Assistant

## 2017-11-07 ENCOUNTER — Other Ambulatory Visit: Payer: Self-pay | Admitting: Physician Assistant

## 2017-12-01 DIAGNOSIS — L820 Inflamed seborrheic keratosis: Secondary | ICD-10-CM | POA: Diagnosis not present

## 2017-12-01 DIAGNOSIS — L301 Dyshidrosis [pompholyx]: Secondary | ICD-10-CM | POA: Diagnosis not present

## 2017-12-01 DIAGNOSIS — D225 Melanocytic nevi of trunk: Secondary | ICD-10-CM | POA: Diagnosis not present

## 2017-12-10 DIAGNOSIS — R69 Illness, unspecified: Secondary | ICD-10-CM | POA: Diagnosis not present

## 2017-12-17 DIAGNOSIS — R69 Illness, unspecified: Secondary | ICD-10-CM | POA: Diagnosis not present

## 2018-02-26 DIAGNOSIS — C61 Malignant neoplasm of prostate: Secondary | ICD-10-CM | POA: Diagnosis not present

## 2018-03-02 DIAGNOSIS — C61 Malignant neoplasm of prostate: Secondary | ICD-10-CM | POA: Diagnosis not present

## 2018-03-11 ENCOUNTER — Ambulatory Visit (INDEPENDENT_AMBULATORY_CARE_PROVIDER_SITE_OTHER): Payer: Medicare HMO | Admitting: Family Medicine

## 2018-03-11 ENCOUNTER — Encounter: Payer: Self-pay | Admitting: Family Medicine

## 2018-03-11 VITALS — BP 110/60 | HR 73 | Temp 97.6°F | Resp 15 | Ht 66.5 in | Wt 213.4 lb

## 2018-03-11 DIAGNOSIS — J320 Chronic maxillary sinusitis: Secondary | ICD-10-CM

## 2018-03-11 MED ORDER — FLUTICASONE PROPIONATE 50 MCG/ACT NA SUSP: 2.0000 | Freq: Every day | NASAL | 6 refills | Status: DC

## 2018-03-11 MED ORDER — DOXYCYCLINE HYCLATE 100 MG PO TABS: 100.0000 mg | ORAL_TABLET | Freq: Two times a day (BID) | ORAL | 0 refills | Status: DC

## 2018-03-11 NOTE — Progress Notes (Signed)
Patient ID: Hunter Porter, male    DOB: Oct 19, 1945, 73 y.o.   MRN: [id number]  PCP: Orlena Sheldon, PA-C  Chief Complaint  Patient presents with  . Sinusitis    Patient in with c/o sinus pressure, sore throat, cough, runny nose. Onset last saturday    Subjective:   Hunter Porter is a 73 y.o. male, presents to clinic with CC of sinus infection x 4 days.  He has congestion, and moderate to severe pain and pressure especially to his left maxillary sinus some radiation to his teeth into his eye.  He has some imaging done by his oral surgeon [facility] -who stated that he had infection in his left maxillary sinus and obstructed sinus outflow track and he has been referred to ENT.  Patient brings in the disc with him but I am do not have any computers available to review the images. Patient also states that he has had worsening sinus pain and pressure for the past 2 months ever since having a dental procedure in the left upper molar.  He takes Zyrtec faithfully and daily and used to take steroid nasal sprays but he is currently not using Flonase.  He has tried taking Mucinex and it is made him feel dry but has not improved any of the pain or pressure and pain especially to his left cheek did acutely worsened today been fairly constant without any other alleviating or aggravating factors.  He denies any fever chills sweats productive cough wheeze neck pain.   Patient Active Problem List   Diagnosis Date Noted  . Primary osteoarthritis of left knee 09/23/2016  . Abnormal EKG 08/31/2016  . Tobacco abuse 08/31/2016  . Dyslipidemia 08/31/2016  . Preop cardiovascular exam 08/31/2016  . OSA on CPAP 08/21/2016  . Ventral hernia 08/21/2016  . Hypertension 05/29/2016  . Lower extremity edema 05/15/2016  . Insomnia 06/01/2015  . Malignant neoplasm of prostate ([place]) 03/17/2013  . Prostate cancer ([place]) 01/20/2013    Class: Stage 2  . GERD  (gastroesophageal reflux disease)   . Anxiety   . Thyroid disease      Prior to Admission medications   Medication Sig Start Date End Date Taking? Authorizing Provider  ALPRAZolam Duanne Moron) 1 MG tablet TAKE 1/2 TABLET TWICE DAILY AS NEEDED. 12/24/16  Yes Orlena Sheldon, PA-C  aspirin EC 325 MG tablet Take 1 tablet (325 mg total) by mouth daily. For 30 days post op for DVT Prophylaxis 10/15/16  Yes Prudencio Burly III, PA-C  Calcium-Magnesium-Zinc (CAL-MAG-ZINC PO) Take 1 tablet by mouth daily.   Yes [provider]  cetirizine (ZYRTEC) 10 MG tablet Take 10 mg by mouth daily.   Yes [provider]  cholecalciferol (VITAMIN D) 1000 units tablet Take 1,000 Units by mouth daily.   Yes [provider]  clobetasol cream (TEMOVATE) 6.28 % Apply 1 application topically 2 (two) times daily. On feet   Yes [provider]  cyclobenzaprine (FLEXERIL) 10 MG tablet Take 1 tablet (10 mg total) by mouth at bedtime. 03/26/17  Yes Orlena Sheldon, PA-C  esomeprazole (NEXIUM) 20 MG capsule Take 20 mg by mouth daily at 12 noon.   Yes [provider]  hydrochlorothiazide (HYDRODIURIL) 25 MG tablet TAKE (1) TABLET BY MOUTH ONCE DAILY. 11/27/16  Yes Orlena Sheldon, PA-C  Multiple Vitamin (MULTIVITAMIN) capsule Take 1 capsule by mouth daily.   Yes [provider]  naproxen (  NAPROSYN) 375 MG tablet TAKE 1 TABLET TWICE DAILY AS NEEDED FOR PAIN.TAKE WITH FOOD. 11/07/17  Yes [name] B, PA-C  Omega-3 Fatty Acids (FISH OIL) 1200 MG CAPS Take by mouth.   Yes [provider]  Polyethyl Glyc-Propyl Glyc PF (SYSTANE ULTRA PF) 0.4-0.3 % SOLN Place 1 drop into both eyes daily as needed (for dry eyes).   Yes [provider]  Potassium 99 MG TABS Take 99 mg by mouth daily.    Yes [provider]  pravastatin (PRAVACHOL) 40 MG tablet Take 40 mg by mouth daily.   Yes [provider]  Turmeric 1053 MG TABS Take by mouth.   Yes [provider]  vitamin E 100 UNIT capsule Take by mouth daily.   Yes [provider]  EPINEPHrine 0.3 mg/0.3 mL IJ SOAJ injection Inject 0.3 mLs (0.3 mg total) into the muscle once. For allergic reaction to bee stings Patient not taking: Reported on 03/11/2018 11/02/13   Alycia Rossetti, MD     Allergies  Allergen Reactions  . Bee Venom Swelling    Throat swells   . Penicillins Rash and Other (See Comments)    PATIENT HAS HAD A PCN REACTION WITH IMMEDIATE RASH, FACIAL/TONGUE/THROAT SWELLING, SOB, OR LIGHTHEADEDNESS WITH HYPOTENSION:  #  #  #  YES  #  #  #   Has patient had a PCN reaction causing severe rash involving mucus membranes or skin necrosis: No Has patient had a PCN reaction that required hospitalization: No Has patient had a PCN reaction occurring within the last 10 years: No If all of the above answers are "NO", then may proceed with Cephalosporin use.   Marland Kitchen Hydrocodone-Acetaminophen Nausea And Vomiting     Family History  Problem Relation Age of Onset  . Cancer Mother        brain  . Cancer Brother        Question lung  . Colon polyps Neg Hx   . Colon cancer Neg Hx   . Rectal cancer Neg Hx   . Stomach cancer Neg Hx      Social History   Socioeconomic History  . Marital status: Single    Spouse name: Not on file  . Number of children: Not on file  . Years of education: Not on file  . Highest education level: Not on file  Occupational History  . Not on file  Social Needs  . Financial resource strain: Not on file  . Food insecurity:    Worry: Not on file    Inability: Not on file  . Transportation needs:    Medical: Not on file    Non-medical: Not on file  Tobacco Use  . Smoking status: Current Every Day Smoker    Packs/day: 1.00    Types: Cigarettes  . Smokeless tobacco: Never Used  Substance and Sexual Activity  . Alcohol use: Yes    Alcohol/week: 24.0 standard drinks    Types: 24 Cans of beer per week    Comment: 24 cans beer week  .  Drug use: No  . Sexual activity: Not Currently  Lifestyle  . Physical activity:    Days per week: Not on file    Minutes per session: Not on file  . Stress: Not on file  Relationships  . Social connections:    Talks on phone: Not on file    Gets together: Not on file    Attends religious service: Not on file  Active member of club or organization: Not on file    Attends meetings of clubs or organizations: Not on file    Relationship status: Not on file  . Intimate partner violence:    Fear of current or ex partner: Not on file    Emotionally abused: Not on file    Physically abused: Not on file    Forced sexual activity: Not on file  Other Topics Concern  . Not on file  Social History Narrative   Retired.    Plays golf 4 days/week.   Works in yard.    Drinks 6 beers each evening.     Review of Systems  Constitutional: Negative.   HENT: Negative.   Eyes: Negative.   Respiratory: Negative.   Cardiovascular: Negative.   Gastrointestinal: Negative.   Endocrine: Negative.   Genitourinary: Negative.   Musculoskeletal: Negative.   Skin: Negative.   Allergic/Immunologic: Negative.   Neurological: Negative.   Hematological: Negative.   Psychiatric/Behavioral: Negative.   All other systems reviewed and are negative.      Objective:    Vitals:   03/11/18 1216  BP: 110/60  Pulse: 73  Resp: 15  Temp: 97.6 F (36.4 C)  TempSrc: Oral  SpO2: 93%  Weight: 213 lb 6 oz (96.8 kg)  Height: 5' 6.5" (1.689 m)      Physical Exam Vitals signs and nursing note reviewed.  Constitutional:      General: He is not in acute distress.    Appearance: Normal appearance. He is well-developed. He is obese. He is not toxic-appearing or diaphoretic.  HENT:     Head: Normocephalic and atraumatic.     Jaw: No trismus.     Right Ear: Tympanic membrane, ear canal and external ear normal.     Left Ear: Tympanic membrane, ear canal and external ear normal.     Nose: Nasal  tenderness, mucosal edema, congestion and rhinorrhea present.     Right Sinus: No maxillary sinus tenderness or frontal sinus tenderness.     Left Sinus: Maxillary sinus tenderness present. No frontal sinus tenderness.     Mouth/Throat:     Mouth: Mucous membranes are not pale, dry and not cyanotic.     Pharynx: Uvula midline. Posterior oropharyngeal erythema present. No oropharyngeal exudate or uvula swelling.     Tonsils: No tonsillar exudate or tonsillar abscesses.  Eyes:     General: Lids are normal.        Right eye: No discharge.        Left eye: No discharge.     Conjunctiva/sclera: Conjunctivae normal.     Pupils: Pupils are equal, round, and reactive to light.  Neck:     Musculoskeletal: Normal range of motion and neck supple.     Trachea: Trachea and phonation normal. No tracheal deviation.  Cardiovascular:     Rate and Rhythm: Normal rate and regular rhythm.     Pulses: Normal pulses.          Radial pulses are 2+ on the right side and 2+ on the left side.     Heart sounds: Normal heart sounds.  Pulmonary:     Effort: Pulmonary effort is normal. No tachypnea, accessory muscle usage or respiratory distress.     Breath sounds: Normal breath sounds. No stridor. No decreased breath sounds, wheezing, rhonchi or rales.  Abdominal:     General: Bowel sounds are normal. There is no distension.     Palpations: Abdomen is soft.  Musculoskeletal:  Normal range of motion.  Skin:    General: Skin is warm and dry.     Capillary Refill: Capillary refill takes less than 2 seconds.     Coloration: Skin is not pale.     Findings: No rash.     Nails: There is no clubbing.   Neurological:     Mental Status: He is alert and oriented to person, place, and time.     Motor: No abnormal muscle tone.     Coordination: Coordination normal.     Gait: Gait normal.  Psychiatric:        Speech: Speech normal.        Behavior: Behavior normal. Behavior is cooperative.           Assessment  & Plan:      ICD-10-CM   1. Maxillary sinusitis, unspecified chronicity J32.0 doxycycline (VIBRA-TABS) 100 MG tablet    fluticasone (FLONASE) 50 MCG/ACT nasal spray    Patient presents with acute on chronic sinusitis, brings in a CT image from his oral surgeon which showed sinus infection to the left maxillary sinus but 4 days ago, cannot verify this by reviewing images, but feel tx for acute bacterial sinusitis in indicated - patient is tender to left maxillary sinus has other evidence of sinusitis/uri with nasal mucosa and posterior oropharyngeal erythema.   treat with doxycycline.   Also encouraged to start Flonase again.    He does have follow-up with ENT, follow-up here as needed.   Delsa Grana, PA-C 03/11/18 12:24 PM

## 2018-03-22 IMAGING — NM NM MISC PROCEDURE
9 series · 54 of 54 positions shown · non-contrast
Comparison: none

[Series 1: wbr rest · 6.40mm/px · 6 of 58 frames shown]
[frame 5/58]
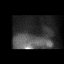
[frame 15/58]
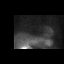
[frame 25/58]
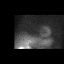
[frame 34/58]
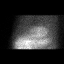
[frame 44/58]
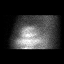
[frame 54/58]
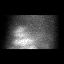

[Series 1: wbr_r-proj_st wbr rest · 6.40mm/px · 6 of 64 frames shown]
[frame 6/64]
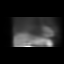
[frame 16/64]
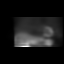
[frame 27/64]
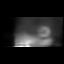
[frame 38/64]
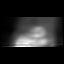
[frame 48/64]
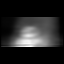
[frame 59/64]
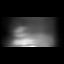

[Series 1: rest sax · 6.4mm · 6.40mm/px · 6 of 25 frames shown]
[frame 3/25]
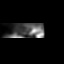
[frame 7/25]
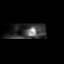
[frame 11/25]
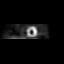
[frame 15/25]
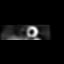
[frame 19/25]
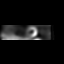
[frame 23/25]
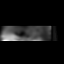

[Series 2: wbr_s-proj_st wbr stress-gsp · 6.40mm/px · 6 of 512 frames shown]
[frame 43/512]
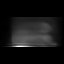
[frame 128/512]
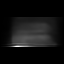
[frame 214/512]
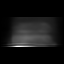
[frame 299/512]
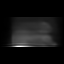
[frame 384/512]
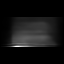
[frame 470/512]
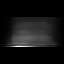

[Series 2: stress sax gs · 6.4mm · 6.40mm/px · 6 of 200 frames shown]
[frame 17/200]
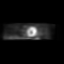
[frame 50/200]
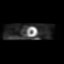
[frame 84/200]
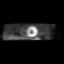
[frame 117/200]
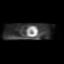
[frame 150/200]
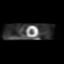
[frame 184/200]
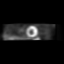

[Series 2: wbr stress-gsp · 6.40mm/px · 6 of 512 frames shown]
[frame 43/512]
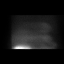
[frame 128/512]
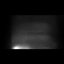
[frame 214/512]
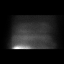
[frame 299/512]
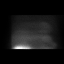
[frame 384/512]
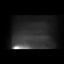
[frame 470/512]
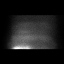

[Series 2: stress sax · 6.4mm · 6.40mm/px · 6 of 25 frames shown]
[frame 3/25]
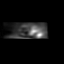
[frame 7/25]
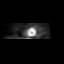
[frame 11/25]
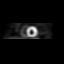
[frame 15/25]
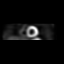
[frame 19/25]
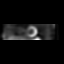
[frame 23/25]
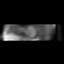

[Series 3: wbr stress-sum-em · 6.40mm/px · 6 of 64 frames shown]
[frame 6/64]
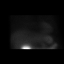
[frame 16/64]
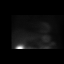
[frame 27/64]
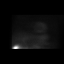
[frame 38/64]
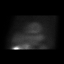
[frame 48/64]
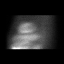
[frame 59/64]
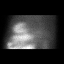

[Series 3: wbr_s-proj_st wbr stress-sum-em · 6.40mm/px · 6 of 64 frames shown]
[frame 6/64]
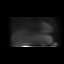
[frame 16/64]
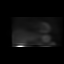
[frame 27/64]
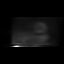
[frame 38/64]
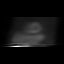
[frame 48/64]
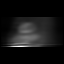
[frame 59/64]
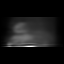

[54 of 54 positions shown; findings below may reference images not displayed]

Canned report from images found in remote index.

Refer to host system for actual result text.

## 2018-09-01 DIAGNOSIS — C61 Malignant neoplasm of prostate: Secondary | ICD-10-CM | POA: Diagnosis not present

## 2018-11-19 DIAGNOSIS — H5213 Myopia, bilateral: Secondary | ICD-10-CM | POA: Diagnosis not present

## 2018-11-19 DIAGNOSIS — Z0100 Encounter for examination of eyes and vision without abnormal findings: Secondary | ICD-10-CM | POA: Diagnosis not present

## 2019-04-11 ENCOUNTER — Ambulatory Visit: Payer: Medicare HMO | Attending: Internal Medicine

## 2019-04-11 DIAGNOSIS — Z23 Encounter for immunization: Secondary | ICD-10-CM | POA: Insufficient documentation

## 2019-04-11 NOTE — Progress Notes (Signed)
   Covid-19 Vaccination Clinic  Name:  Hunter Porter    MRN: [id number] DOB: Mar 26, 1945  04/11/2019  Hunter Porter was observed post Covid-19 immunization for 15 minutes without incidence. He was provided with Vaccine Information Sheet and instruction to access the V-Safe system.   Hunter Porter was instructed to call 911 with any severe reactions post vaccine: Marland Kitchen Difficulty breathing  . Swelling of your face and throat  . A fast heartbeat  . A bad rash all over your body  . Dizziness and weakness    Immunizations Administered    Name Date Dose VIS Date Route   Pfizer COVID-19 Vaccine 04/11/2019 10:44 AM 0.3 mL 02/05/2019 Intramuscular   Manufacturer: [place]   Lot: [id number]   [place]: [id number]

## 2019-05-04 ENCOUNTER — Ambulatory Visit: Payer: Medicare HMO | Attending: Internal Medicine

## 2019-05-04 DIAGNOSIS — Z23 Encounter for immunization: Secondary | ICD-10-CM | POA: Insufficient documentation

## 2019-05-04 NOTE — Progress Notes (Signed)
   Covid-19 Vaccination Clinic  Name:  [name]    MRN: [id number] DOB: 06/17/1945  05/04/2019  [name] was observed post Covid-19 immunization for 15 minutes without incident. He was provided with Vaccine Information Sheet and instruction to access the V-Safe system.   [name] was instructed to call 911 with any severe reactions post vaccine: Marland Kitchen Difficulty breathing  . Swelling of face and throat  . A fast heartbeat  . A bad rash all over body  . Dizziness and weakness   Immunizations Administered    Name Date Dose VIS Date Route   Pfizer COVID-19 Vaccine 05/04/2019 12:45 PM 0.3 mL 02/05/2019 Intramuscular   Manufacturer: [place]   Lot: [id number]   [place]: [id number]

## 2019-05-26 DIAGNOSIS — C61 Malignant neoplasm of prostate: Secondary | ICD-10-CM | POA: Diagnosis not present

## 2019-06-03 DIAGNOSIS — R3121 Asymptomatic microscopic hematuria: Secondary | ICD-10-CM | POA: Diagnosis not present

## 2019-06-03 DIAGNOSIS — N5231 Erectile dysfunction following radical prostatectomy: Secondary | ICD-10-CM | POA: Diagnosis not present

## 2019-06-03 DIAGNOSIS — C61 Malignant neoplasm of prostate: Secondary | ICD-10-CM | POA: Diagnosis not present

## 2019-06-08 DIAGNOSIS — R3129 Other microscopic hematuria: Secondary | ICD-10-CM | POA: Diagnosis not present

## 2019-06-08 DIAGNOSIS — R3121 Asymptomatic microscopic hematuria: Secondary | ICD-10-CM | POA: Diagnosis not present

## 2019-07-05 DIAGNOSIS — R3121 Asymptomatic microscopic hematuria: Secondary | ICD-10-CM | POA: Diagnosis not present

## 2019-07-05 DIAGNOSIS — Z8546 Personal history of malignant neoplasm of prostate: Secondary | ICD-10-CM | POA: Diagnosis not present

## 2019-10-27 DIAGNOSIS — M25562 Pain in left knee: Secondary | ICD-10-CM | POA: Diagnosis not present

## 2019-11-15 DIAGNOSIS — M25662 Stiffness of left knee, not elsewhere classified: Secondary | ICD-10-CM | POA: Diagnosis not present

## 2019-11-15 DIAGNOSIS — M7632 Iliotibial band syndrome, left leg: Secondary | ICD-10-CM | POA: Diagnosis not present

## 2019-11-22 DIAGNOSIS — M25662 Stiffness of left knee, not elsewhere classified: Secondary | ICD-10-CM | POA: Diagnosis not present

## 2019-11-22 DIAGNOSIS — M7632 Iliotibial band syndrome, left leg: Secondary | ICD-10-CM | POA: Diagnosis not present

## 2019-12-01 DIAGNOSIS — M7632 Iliotibial band syndrome, left leg: Secondary | ICD-10-CM | POA: Diagnosis not present

## 2019-12-01 DIAGNOSIS — M25662 Stiffness of left knee, not elsewhere classified: Secondary | ICD-10-CM | POA: Diagnosis not present

## 2019-12-08 DIAGNOSIS — M25562 Pain in left knee: Secondary | ICD-10-CM | POA: Diagnosis not present

## 2020-01-25 DIAGNOSIS — R310 Gross hematuria: Secondary | ICD-10-CM | POA: Diagnosis not present

## 2020-02-15 DIAGNOSIS — R310 Gross hematuria: Secondary | ICD-10-CM | POA: Diagnosis not present

## 2020-11-07 ENCOUNTER — Encounter: Payer: Self-pay | Admitting: Gastroenterology

## 2021-01-22 ENCOUNTER — Encounter: Payer: Self-pay | Admitting: Gastroenterology

## 2021-03-08 ENCOUNTER — Other Ambulatory Visit: Payer: Self-pay

## 2021-03-08 ENCOUNTER — Ambulatory Visit (INDEPENDENT_AMBULATORY_CARE_PROVIDER_SITE_OTHER): Payer: Medicare HMO

## 2021-03-08 ENCOUNTER — Ambulatory Visit
Admission: EM | Admit: 2021-03-08 | Discharge: 2021-03-08 | Disposition: A | Discharge status: Home / Self Care | Payer: Medicare HMO | Attending: Urgent Care | Admitting: Urgent Care

## 2021-03-08 DIAGNOSIS — R059 Cough, unspecified: Secondary | ICD-10-CM | POA: Diagnosis not present

## 2021-03-08 DIAGNOSIS — Z1152 Encounter for screening for COVID-19: Secondary | ICD-10-CM

## 2021-03-08 DIAGNOSIS — R69 Illness, unspecified: Secondary | ICD-10-CM | POA: Diagnosis not present

## 2021-03-08 DIAGNOSIS — J189 Pneumonia, unspecified organism: Secondary | ICD-10-CM

## 2021-03-08 DIAGNOSIS — J3089 Other allergic rhinitis: Secondary | ICD-10-CM | POA: Diagnosis not present

## 2021-03-08 DIAGNOSIS — F17200 Nicotine dependence, unspecified, uncomplicated: Secondary | ICD-10-CM | POA: Diagnosis not present

## 2021-03-08 MED ORDER — CEFDINIR 300 MG PO CAPS: 300.0000 mg | ORAL_CAPSULE | Freq: Two times a day (BID) | ORAL | 0 refills | Status: DC

## 2021-03-08 MED ORDER — BENZONATATE 100 MG PO CAPS: 100.0000 mg | ORAL_CAPSULE | Freq: Three times a day (TID) | ORAL | 0 refills | Status: DC | PRN

## 2021-03-08 MED ORDER — PROMETHAZINE-DM 6.25-15 MG/5ML PO SYRP: 5.0000 mL | ORAL_SOLUTION | Freq: Every evening | ORAL | 0 refills | Status: DC | PRN

## 2021-03-08 MED ORDER — AZITHROMYCIN 250 MG PO TABS: ORAL_TABLET | ORAL | 0 refills | Status: DC

## 2021-03-08 NOTE — ED Provider Notes (Signed)
[place]   MRN: [id number] DOB: 1945/11/03  Subjective:   Hunter Porter is a 76 y.o. male presenting for 10 day history of acute onset persistent and worsening malaise, fatigue, cough, chest and sinus congestion, body aches. Has an extensive history of smoking. Does not take any medications for his smoking, does not have COPD.    Current Facility-Administered Medications:    0.9 %  sodium chloride infusion, 500 mL, Intravenous, Continuous, Ladene Artist, MD  Current Outpatient Medications:    ALPRAZolam (XANAX) 1 MG tablet, TAKE 1/2 TABLET TWICE DAILY AS NEEDED., Disp: 30 tablet, Rfl: 2   aspirin EC 325 MG tablet, Take 1 tablet (325 mg total) by mouth daily. For 30 days post op for DVT Prophylaxis, Disp: 30 tablet, Rfl: 0   Calcium-Magnesium-Zinc (CAL-MAG-ZINC PO), Take 1 tablet by mouth daily., Disp: , Rfl:    cetirizine (ZYRTEC) 10 MG tablet, Take 10 mg by mouth daily., Disp: , Rfl:    cholecalciferol (VITAMIN D) 1000 units tablet, Take 1,000 Units by mouth daily., Disp: , Rfl:    clobetasol cream (TEMOVATE) 2.22 %, Apply 1 application topically 2 (two) times daily. On feet, Disp: , Rfl:    cyclobenzaprine (FLEXERIL) 10 MG tablet, Take 1 tablet (10 mg total) by mouth at bedtime., Disp: 30 tablet, Rfl: 2   doxycycline (VIBRA-TABS) 100 MG tablet, Take 1 tablet (100 mg total) by mouth 2 (two) times daily., Disp: 20 tablet, Rfl: 0   EPINEPHrine 0.3 mg/0.3 mL IJ SOAJ injection, Inject 0.3 mLs (0.3 mg total) into the muscle once. For allergic reaction to bee stings (Patient not taking: Reported on 03/11/2018), Disp: 1 Device, Rfl: 3   esomeprazole (NEXIUM) 20 MG capsule, Take 20 mg by mouth daily at 12 noon., Disp: , Rfl:    fluticasone (FLONASE) 50 MCG/ACT nasal spray, Place 2 sprays into both nostrils daily., Disp: 16 g, Rfl: 6   hydrochlorothiazide (HYDRODIURIL) 25 MG tablet, TAKE (1) TABLET BY MOUTH ONCE DAILY., Disp: 90 tablet, Rfl: 1   Multiple Vitamin  (MULTIVITAMIN) capsule, Take 1 capsule by mouth daily., Disp: , Rfl:    naproxen (NAPROSYN) 375 MG tablet, TAKE 1 TABLET TWICE DAILY AS NEEDED FOR PAIN.TAKE WITH FOOD., Disp: 60 tablet, Rfl: 0   Omega-3 Fatty Acids (FISH OIL) 1200 MG CAPS, Take by mouth., Disp: , Rfl:    Polyethyl Glyc-Propyl Glyc PF (SYSTANE ULTRA PF) 0.4-0.3 % SOLN, Place 1 drop into both eyes daily as needed (for dry eyes)., Disp: , Rfl:    Potassium 99 MG TABS, Take 99 mg by mouth daily. , Disp: , Rfl:    pravastatin (PRAVACHOL) 40 MG tablet, Take 40 mg by mouth daily., Disp: , Rfl:    Turmeric 1053 MG TABS, Take by mouth., Disp: , Rfl:    vitamin E 100 UNIT capsule, Take by mouth daily., Disp: , Rfl:    Allergies  Allergen Reactions   Bee Venom Swelling    Throat swells    Penicillins Rash and Other (See Comments)    PATIENT HAS HAD A PCN REACTION WITH IMMEDIATE RASH, FACIAL/TONGUE/THROAT SWELLING, SOB, OR LIGHTHEADEDNESS WITH HYPOTENSION:  #  #  #  YES  #  #  #   Has patient had a PCN reaction causing severe rash involving mucus membranes or skin necrosis: No Has patient had a PCN reaction that required hospitalization: No Has patient had a PCN reaction occurring within the last 10 years: No If all of the above answers are "  NO", then may proceed with Cephalosporin use.    Hydrocodone-Acetaminophen Nausea And Vomiting    Past Medical History:  Diagnosis Date   Adenomatous colon polyp    Allergy    Anxiety    Arthritis    Cataract    removed bilateral   GERD (gastroesophageal reflux disease)    H/O hemorrhoids    H/O hiatal hernia    Heart murmur 2007   per pt history- possible murmur by PCp- states negative stress test   Hyperlipidemia    Prostate cancer ([place]) 01/20/2013   Sleep apnea    CPAP.  Trying to use it. (NOT USING CURRENTLY )   DX YEARS AGO   Thyroid disease    graves   Ulcer approx 1970   HX peptic ulcer     Past Surgical History:  Procedure Laterality Date   CATARACT EXTRACTION W/  INTRAOCULAR LENS  IMPLANT, BILATERAL     COLONOSCOPY     LYMPHADENECTOMY Bilateral 03/17/2013   Procedure: LYMPHADENECTOMY PELVIC LYMPH NODE DISSECTION;  Surgeon: [name], MD;  Location: [facility];  Service: Urology;  Laterality: Bilateral;   POLYPECTOMY     PROSTATE BIOPSY     ROBOT ASSISTED LAPAROSCOPIC RADICAL PROSTATECTOMY N/A 03/17/2013   Procedure: ROBOTIC ASSISTED LAPAROSCOPIC RADICAL PROSTATECTOMY;  Surgeon: [name], MD;  Location: [facility];  Service: Urology;  Laterality: N/A;   TONSILLECTOMY     TOTAL KNEE ARTHROPLASTY Left 10/15/2016   Procedure: TOTAL KNEE ARTHROPLASTY;  Surgeon: Renette Butters, MD;  Location: [facility];  Service: Orthopedics;  Laterality: Left;   TRANSURETHRAL RESECTION OF PROSTATE  12/01/2012   Dr Risa Grill   UPPER GASTROINTESTINAL ENDOSCOPY      Family History  Problem Relation Age of Onset   Cancer Mother        brain   Cancer Brother        Question lung   Colon polyps Neg Hx    Colon cancer Neg Hx    Rectal cancer Neg Hx    Stomach cancer Neg Hx     Social History   Tobacco Use   Smoking status: Every Day    Packs/day: 1.00    Types: Cigarettes   Smokeless tobacco: Never  Vaping Use   Vaping Use: Never used  Substance Use Topics   Alcohol use: Yes    Alcohol/week: 24.0 standard drinks    Types: 24 Cans of beer per week    Comment: 24 cans beer week   Drug use: No    ROS   Objective:   Vitals: BP 128/70    Pulse 64    Temp 98.5 F (36.9 C)    Resp 18    SpO2 98%   Physical Exam Constitutional:      General: He is not in acute distress.    Appearance: Normal appearance. He is well-developed. He is not ill-appearing, toxic-appearing or diaphoretic.  HENT:     Head: Normocephalic and atraumatic.     Right Ear: External ear normal.     Left Ear: External ear normal.     Nose: Nose normal.     Mouth/Throat:     Mouth: Mucous membranes are moist.  Eyes:     General: No scleral icterus.       Right eye: No discharge.         Left eye: No discharge.     Extraocular Movements: Extraocular movements intact.  Cardiovascular:     Rate and Rhythm: Normal  rate and regular rhythm.     Heart sounds: Normal heart sounds. No murmur heard.   No friction rub. No gallop.  Pulmonary:     Effort: Pulmonary effort is normal. No respiratory distress.     Breath sounds: No stridor. Wheezing and rhonchi present. No rales.  Neurological:     Mental Status: He is alert and oriented to person, place, and time.  Psychiatric:        Mood and Affect: Mood normal.        Behavior: Behavior normal.        Thought Content: Thought content normal.        Judgment: Judgment normal.    DG Chest 2 View  Result Date: 03/08/2021 CLINICAL DATA:  Cough.  Smoker. EXAM: CHEST - 2 VIEW COMPARISON:  None. FINDINGS: Heart is normal in size. Mild aortic tortuosity. Lungs are hyperinflated with peribronchial thickening and interstitial coarsening. There is patchy airspace disease the periphery of the right lower mid lung, possibly localizing to the middle lobe on the lateral view. Mild biapical pleuroparenchymal scarring. No pneumothorax or pleural effusion. Flowing syndesmophytes throughout the thoracic spine. The bones are under mineralized. No acute osseous abnormalities are seen. IMPRESSION: Patchy airspace disease in the right mid lung, possibly in the right middle lobe, suspicious for pneumonia. Followup PA and lateral chest X-ray is recommended in 3-4 weeks following trial of antibiotic therapy to ensure resolution and exclude underlying malignancy. Electronically Signed   By: Keith Rake M.D.   On: 03/08/2021 16:04     Assessment and Plan :   PDMP not reviewed this encounter.  1. Pneumonia of right middle lobe due to infectious organism   2. Encounter for screening for COVID-19   3. Smoker   4. Allergic rhinitis due to other allergic trigger, unspecified seasonality    Labs pending.  Given penicillin allergy while he was cefdinir  together with azithromycin.  Use supportive care otherwise.  Repeat chest x-ray in 3 to 4 weeks. Counseled patient on potential for adverse effects with medications prescribed/recommended today, ER and return-to-clinic precautions discussed, patient verbalized understanding.    Jaynee Eagles, [place] 03/08/21 1654

## 2021-03-08 NOTE — ED Triage Notes (Signed)
Pt presents with nasal congestion and cough with body aches for over a week, has had 2 negative covid test

## 2021-03-12 ENCOUNTER — Ambulatory Visit: Payer: Medicare HMO | Admitting: Orthopedic Surgery

## 2021-03-19 ENCOUNTER — Ambulatory Visit (AMBULATORY_SURGERY_CENTER): Payer: Medicare HMO | Admitting: *Deleted

## 2021-03-19 ENCOUNTER — Telehealth: Payer: Self-pay | Admitting: *Deleted

## 2021-03-19 ENCOUNTER — Encounter: Payer: Self-pay | Admitting: Nurse Practitioner

## 2021-03-19 ENCOUNTER — Ambulatory Visit (INDEPENDENT_AMBULATORY_CARE_PROVIDER_SITE_OTHER): Payer: Medicare HMO | Admitting: Nurse Practitioner

## 2021-03-19 ENCOUNTER — Other Ambulatory Visit: Payer: Self-pay

## 2021-03-19 VITALS — BP 132/86 | HR 65 | Temp 97.6°F | Ht 67.5 in | Wt 186.0 lb

## 2021-03-19 VITALS — Ht 66.5 in | Wt 180.0 lb

## 2021-03-19 DIAGNOSIS — M159 Polyosteoarthritis, unspecified: Secondary | ICD-10-CM | POA: Diagnosis not present

## 2021-03-19 DIAGNOSIS — Z720 Tobacco use: Secondary | ICD-10-CM | POA: Diagnosis not present

## 2021-03-19 DIAGNOSIS — R69 Illness, unspecified: Secondary | ICD-10-CM | POA: Diagnosis not present

## 2021-03-19 DIAGNOSIS — F1010 Alcohol abuse, uncomplicated: Secondary | ICD-10-CM

## 2021-03-19 DIAGNOSIS — E079 Disorder of thyroid, unspecified: Secondary | ICD-10-CM

## 2021-03-19 DIAGNOSIS — E785 Hyperlipidemia, unspecified: Secondary | ICD-10-CM

## 2021-03-19 DIAGNOSIS — Z86010 Personal history of colonic polyps: Secondary | ICD-10-CM

## 2021-03-19 DIAGNOSIS — E559 Vitamin D deficiency, unspecified: Secondary | ICD-10-CM | POA: Diagnosis not present

## 2021-03-19 DIAGNOSIS — Z8546 Personal history of malignant neoplasm of prostate: Secondary | ICD-10-CM

## 2021-03-19 DIAGNOSIS — E538 Deficiency of other specified B group vitamins: Secondary | ICD-10-CM | POA: Diagnosis not present

## 2021-03-19 DIAGNOSIS — F419 Anxiety disorder, unspecified: Secondary | ICD-10-CM

## 2021-03-19 LAB — COMPLETE METABOLIC PANEL WITH GFR
AG Ratio: 1.4 (calc) (ref 1.0–2.5)
ALT: 17 U/L (ref 9–46)
AST: 31 U/L (ref 10–35)
Albumin: 3.8 g/dL (ref 3.6–5.1)
Alkaline phosphatase (APISO): 65 U/L (ref 35–144)
BUN: 13 mg/dL (ref 7–25)
CO2: 29 mmol/L (ref 20–32)
Calcium: 8.7 mg/dL (ref 8.6–10.3)
Chloride: 103 mmol/L (ref 98–110)
Creat: 0.87 mg/dL (ref 0.70–1.28)
Globulin: 2.7 g/dL (calc) (ref 1.9–3.7)
Glucose, Bld: 78 mg/dL (ref 65–99)
Potassium: 3.9 mmol/L (ref 3.5–5.3)
Sodium: 140 mmol/L (ref 135–146)
Total Bilirubin: 0.5 mg/dL (ref 0.2–1.2)
Total Protein: 6.5 g/dL (ref 6.1–8.1)
eGFR: 91 mL/min/{1.73_m2} (ref >=60)

## 2021-03-19 LAB — CBC WITH DIFFERENTIAL/PLATELET
Absolute Monocytes: 384 cells/uL (ref 200–950)
Basophils Absolute: 31 cells/uL (ref 0–200)
Basophils Relative: 1 %
Eosinophils Absolute: 31 cells/uL (ref 15–500)
Eosinophils Relative: 1 %
HCT: 38.5 % (ref 38.5–50.0)
Hemoglobin: 13.5 g/dL (ref 13.2–17.1)
Lymphs Abs: 515 cells/uL — ABNORMAL LOW (ref 850–3900)
MCH: 33.9 pg — ABNORMAL HIGH (ref 27.0–33.0)
MCHC: 35.1 g/dL (ref 32.0–36.0)
MCV: 96.7 fL (ref 80.0–100.0)
MPV: 9.7 fL (ref 7.5–12.5)
Monocytes Relative: 12.4 %
Neutro Abs: 2139 cells/uL (ref 1500–7800)
Neutrophils Relative %: 69 %
Platelets: 158 10*3/uL (ref 140–400)
RBC: 3.98 10*6/uL — ABNORMAL LOW (ref 4.20–5.80)
RDW: 13.6 % (ref 11.0–15.0)
Total Lymphocyte: 16.6 %
WBC: 3.1 10*3/uL — ABNORMAL LOW (ref 3.8–10.8)

## 2021-03-19 LAB — LIPID PANEL
Cholesterol: 158 mg/dL (ref <200)
HDL: 77 mg/dL (ref >=40)
LDL Cholesterol (Calc): 65 mg/dL (calc)
Non-HDL Cholesterol (Calc): 81 mg/dL (calc) (ref <130)
Total CHOL/HDL Ratio: 2.1 (calc) (ref <5.0)
Triglycerides: 80 mg/dL (ref <150)

## 2021-03-19 LAB — TSH: TSH: 3.49 mIU/L (ref 0.40–4.50)

## 2021-03-19 LAB — PSA: PSA: <0.04 ng/mL (ref <=4.00)

## 2021-03-19 MED ORDER — NA SULFATE-K SULFATE-MG SULF 17.5-3.13-1.6 GM/177ML PO SOLN: 1.0000 | Freq: Once | ORAL | Status: DC

## 2021-03-19 MED ORDER — NA SULFATE-K SULFATE-MG SULF 17.5-3.13-1.6 GM/177ML PO SOLN: 1.0000 | Freq: Once | ORAL | 0 refills | Status: DC

## 2021-03-19 NOTE — Telephone Encounter (Signed)
Prep to pharmacy.

## 2021-03-19 NOTE — Progress Notes (Signed)
Careteam: Patient Care Team: Lauree Chandler, NP as PCP - General (Geriatric Medicine)  PLACE OF SERVICE:  [place] Directive information Does Patient Have a Medical Advance Directive?: No, Would patient like information on creating a medical advance directive?: No - Patient declined  Allergies  Allergen Reactions   Bee Venom Swelling    Throat swells    Penicillins Rash and Other (See Comments)    PATIENT HAS HAD A PCN REACTION WITH IMMEDIATE RASH, FACIAL/TONGUE/THROAT SWELLING, SOB, OR LIGHTHEADEDNESS WITH HYPOTENSION:  #  #  #  YES  #  #  #   Has patient had a PCN reaction causing severe rash involving mucus membranes or skin necrosis: No Has patient had a PCN reaction that required hospitalization: No Has patient had a PCN reaction occurring within the last 10 years: No If all of the above answers are "NO", then may proceed with Cephalosporin use.    Hydrocodone-Acetaminophen Nausea And Vomiting    Chief Complaint  Patient presents with   Establish Care    New patient establish care.      HPI: Patient is a 76 y.o. male to establish care. He was seen at [facility] but when he went to follow up he was no longer a patient.  [facility] an option for a local provider.   GERD- taking nexium 20 mg by mouth daily, controls symptoms.   Seasonal allergies- on zyrtec 10 mg daily  Anxiety- has xanax that he uses raraly, quarter tablet ever 6-8 weeks as needed, using lifestyle modifications to control symptoms.   Vit d def- taking 1000 units daily  Vit b12 def- on 1000 mcg daily   He was diagnosised with pneumonia 10 days ago. Treated with azithromycin. Reports he still has congestion but feeling better.   Osteoarthritis- major joints, knees, shoulders. Taking naproxen 350 mg? Prescribed by [facility]. Taking this twice daily. Had left total knee replacement.   Hx of prostate cancer- s/p radical prostatectomy  6 years  ago. Continues to follow up with urologist yearly.   OSA- unable to tolerate CPAP, does not sleep well when using.   Hyperlipidemia- not on medication, "not a real concern" per [facility]  Graves disease s/p radiation treatment reports normal thyroid levels now, not on medication.   Has colonoscopy next month 04/04/21- Dr Fuller Plan  Drinks about 3-4 beers a day- has cut back from prior Smoking 1/2 ppd   Reports he has had several of his vaccine at the [place].   He reports it is hard to get an appt at the [place].   Review of Systems:  Review of Systems  Constitutional:  Negative for chills, fever and weight loss.  HENT:  Negative for tinnitus.   Respiratory:  Negative for cough, sputum production and shortness of breath.   Cardiovascular:  Negative for chest pain, palpitations and leg swelling.  Gastrointestinal:  Negative for abdominal pain, constipation, diarrhea and heartburn.  Genitourinary:  Negative for dysuria, frequency and urgency.  Musculoskeletal:  Positive for back pain and joint pain. Negative for falls and myalgias.  Skin: Negative.   Neurological:  Negative for dizziness and headaches.  Psychiatric/Behavioral:  Negative for depression and memory loss. The patient is nervous/anxious. The patient does not have insomnia.    Past Medical History:  Diagnosis Date   Adenomatous colon polyp    Allergy    Anxiety    Arthritis    Cataract  removed bilateral   GERD (gastroesophageal reflux disease)    Graves disease    Per [facility] New Patient Packet   H/O hemorrhoids    H/O hiatal hernia    Heart murmur 2007   per pt history- possible murmur by PCp- states negative stress test   Hyperlipidemia    Pneumonia    dx 03/08/21   Prostate cancer ([place]) 01/20/2013   Sleep apnea    CPAP.  Trying to use it. (NOT USING CURRENTLY )   DX YEARS AGO   Thyroid disease    graves   Ulcer approx 1970   HX peptic ulcer   Past Surgical History:  Procedure Laterality Date   CATARACT EXTRACTION W/  INTRAOCULAR LENS  IMPLANT, BILATERAL     COLONOSCOPY  2017   LYMPHADENECTOMY Bilateral 03/17/2013   Procedure: LYMPHADENECTOMY PELVIC LYMPH NODE DISSECTION;  Surgeon: [name], MD;  [facility];  Service: Urology;  Laterality: Bilateral;   POLYPECTOMY     PROSTATE BIOPSY     ROBOT ASSISTED LAPAROSCOPIC RADICAL PROSTATECTOMY N/A 03/17/2013   Procedure: ROBOTIC ASSISTED LAPAROSCOPIC RADICAL PROSTATECTOMY;  Surgeon: [name], MD;  [facility];  Service: Urology;  Laterality: N/A;   TONSILLECTOMY     TOTAL KNEE ARTHROPLASTY Left 10/15/2016   Procedure: TOTAL KNEE ARTHROPLASTY;  Surgeon: Renette Butters, MD;  Location: [facility];  Service: Orthopedics;  Laterality: Left;   TRANSURETHRAL RESECTION OF PROSTATE  12/01/2012   Dr Risa Grill   UPPER GASTROINTESTINAL ENDOSCOPY     Social History:   reports that he has been smoking cigarettes. He has been smoking an average of .5 packs per day. He has never used smokeless tobacco. He reports current alcohol use of about 24.0 standard drinks per week. He reports that he does not use drugs.  Family History  Problem Relation Age of Onset   Cancer Mother        brain   Cancer Brother        Question lung   Colon polyps Neg Hx    Colon cancer Neg Hx    Rectal cancer Neg Hx    Stomach cancer Neg Hx    Esophageal cancer Neg Hx     Medications: Patient's Medications  New Prescriptions   No medications on file  Previous Medications   ALPRAZOLAM (XANAX) 1 MG TABLET    TAKE 1/2 TABLET TWICE DAILY AS NEEDED.   CETIRIZINE (ZYRTEC) 10 MG TABLET    Take 10 mg by mouth daily.   CHOLECALCIFEROL (VITAMIN D) 1000 UNITS TABLET    Take 1,000 Units by mouth daily.   EPINEPHRINE 0.3 MG/0.3 ML IJ SOAJ INJECTION    Inject 0.3 mLs (0.3 mg total) into the muscle once. For allergic reaction to bee stings   ESOMEPRAZOLE (NEXIUM) 20 MG CAPSULE    Take by mouth.   VITAMIN B-12 (CYANOCOBALAMIN) 1000 MCG TABLET    Take 1,000 mcg by mouth daily.   VITAMIN  E 100 UNIT CAPSULE    Take by mouth daily.  Modified Medications   No medications on file  Discontinued Medications   ASPIRIN EC 325 MG TABLET    Take 1 tablet (325 mg total) by mouth daily. For 30 days post op for DVT Prophylaxis   AZITHROMYCIN (ZITHROMAX) 250 MG TABLET    Day 1: take 2 tablets. Day 2-5: Take 1 tablet daily.   BENZONATATE (TESSALON) 100 MG CAPSULE    Take 1-2 capsules (100-200 mg total) by mouth 3 (  three) times daily as needed for cough.   CALCIUM-MAGNESIUM-ZINC (CAL-MAG-ZINC PO)    Take 1 tablet by mouth daily.   CEFDINIR (OMNICEF) 300 MG CAPSULE    Take 1 capsule (300 mg total) by mouth 2 (two) times daily.   CLOBETASOL CREAM (TEMOVATE) 0.05 %    Apply 1 application topically 2 (two) times daily. On feet   CYCLOBENZAPRINE (FLEXERIL) 10 MG TABLET    Take 1 tablet (10 mg total) by mouth at bedtime.   DOXYCYCLINE (VIBRA-TABS) 100 MG TABLET    Take 1 tablet (100 mg total) by mouth 2 (two) times daily.   ESOMEPRAZOLE (NEXIUM) 20 MG CAPSULE    Take 20 mg by mouth daily at 12 noon.   FLUTICASONE (FLONASE) 50 MCG/ACT NASAL SPRAY    Place 2 sprays into both nostrils daily.   GABAPENTIN (NEURONTIN) 100 MG CAPSULE    Take by mouth.   HYDROCHLOROTHIAZIDE (HYDRODIURIL) 25 MG TABLET    TAKE (1) TABLET BY MOUTH ONCE DAILY.   MULTIPLE VITAMIN (MULTIVITAMIN) CAPSULE    Take 1 capsule by mouth daily.   NA SULFATE-K SULFATE-MG SULF 17.5-3.13-1.6 GM/177ML SOLN    Take 1 kit by mouth once for 1 dose. May use generic   NAPROXEN (NAPROSYN) 375 MG TABLET    TAKE 1 TABLET TWICE DAILY AS NEEDED FOR PAIN.TAKE WITH FOOD.   OMEGA-3 FATTY ACIDS (FISH OIL) 1200 MG CAPS    Take by mouth.   POLYETHYL GLYC-PROPYL GLYC PF 0.4-0.3 % SOLN    Place 1 drop into both eyes daily as needed (for dry eyes).   POTASSIUM 99 MG TABS    Take 99 mg by mouth daily.    PRAVASTATIN (PRAVACHOL) 40 MG TABLET    Take 40 mg by mouth daily.   PROMETHAZINE-DEXTROMETHORPHAN (PROMETHAZINE-DM) 6.25-15 MG/5ML SYRUP    Take 5 mLs by  mouth at bedtime as needed for cough.   TURMERIC 1053 MG TABS    Take by mouth daily.    Physical Exam:  Vitals:   03/19/21 1342  BP: 132/86  Pulse: 65  Temp: 97.6 F (36.4 C)  TempSrc: Temporal  SpO2: 96%  Weight: 186 lb (84.4 kg)  Height: 5' 7.5" (1.715 m)   Body mass index is 28.7 kg/m. Wt Readings from Last 3 Encounters:  03/19/21 186 lb (84.4 kg)  03/19/21 180 lb (81.6 kg)  03/11/18 213 lb 6 oz (96.8 kg)    Physical Exam Constitutional:      General: He is not in acute distress.    Appearance: He is well-developed. He is not diaphoretic.  HENT:     Head: Normocephalic and atraumatic.     Right Ear: External ear normal.     Left Ear: External ear normal.     Mouth/Throat:     Pharynx: No oropharyngeal exudate.  Eyes:     Conjunctiva/sclera: Conjunctivae normal.     Pupils: Pupils are equal, round, and reactive to light.  Cardiovascular:     Rate and Rhythm: Normal rate and regular rhythm.     Heart sounds: Normal heart sounds.  Pulmonary:     Effort: Pulmonary effort is normal.     Breath sounds: Normal breath sounds.  Abdominal:     General: Bowel sounds are normal.     Palpations: Abdomen is soft.  Musculoskeletal:        General: No tenderness.     Cervical back: Normal range of motion and neck supple.     Right lower leg:  No edema.     Left lower leg: No edema.  Skin:    General: Skin is warm and dry.  Neurological:     Mental Status: He is alert and oriented to person, place, and time.    Labs reviewed: Basic Metabolic Panel: No results for input(s): NA, K, CL, CO2, GLUCOSE, BUN, CREATININE, CALCIUM, MG, PHOS, TSH in the last 8760 hours. Liver Function Tests: No results for input(s): AST, ALT, ALKPHOS, BILITOT, PROT, ALBUMIN in the last 8760 hours. No results for input(s): LIPASE, AMYLASE in the last 8760 hours. No results for input(s): AMMONIA in the last 8760 hours. CBC: No results for input(s): WBC, NEUTROABS, HGB, HCT, MCV, PLT in the  last 8760 hours. Lipid Panel: No results for input(s): CHOL, HDL, LDLCALC, TRIG, CHOLHDL, LDLDIRECT in the last 8760 hours. TSH: No results for input(s): TSH in the last 8760 hours. A1C: No results found for: HGBA1C   Assessment/Plan 1. Thyroid disease -will follow up TSH, not currently on medication.  - TSH  2. Tobacco abuse Encouraged cessation - CBC with Differential/Platelet  3. [facility] of [facility] - [facility]  4. Dyslipidemia Not currently on medication, dietary modifications encouraged - CMP with eGFR([facility]) - Lipid Panel  5. Primary osteoarthritis involving multiple joints Continues on NSAID. Discussed risk of long term use of NSAID. Tylenol recommended, <2000 mg in 24 hours due to ETOH use. He reports tylenol not effective - CBC with Differential/Platelet  6. B12 deficiency Continues on supplement.   7. Vitamin D deficiency Continues on supplement  8. ETOH abuse Has cut back, encouraged to continue to cut back oh alcohol intake.   9. Anxiety Controlled with lifestyle modifications, rare use of xanax if needed   Next appt: 6 months.  Carlos American. [place], [facility] [phone]

## 2021-03-19 NOTE — Patient Instructions (Addendum)
Please sign record release from [facility] and urologist.   If you can call or get online and get last labs, vaccines records from [place] for our records this would be helpful.   Netipot or saline wash daily Plain nasal saline spray throughout the day as needed humidifier in the home to help with the dry air Mucinex DM by mouth twice daily as needed for cough and chest congestion with full glass of water  Keep well hydrated Proper nutrition  Avoid forcefully blowing nose

## 2021-03-19 NOTE — Telephone Encounter (Signed)
Message left on mobile # [phone] with call back #,attempted home number of [phone] kept ringing attempted x2,explained that he needed to call by 5 pm to reschedule pre-visit or procedure for 04/02/21 will have to be cancelled.

## 2021-03-19 NOTE — Progress Notes (Signed)

## 2021-03-19 NOTE — Telephone Encounter (Signed)
Pt. Called back was able to complete pre-visit.

## 2021-03-20 DIAGNOSIS — E538 Deficiency of other specified B group vitamins: Secondary | ICD-10-CM | POA: Insufficient documentation

## 2021-03-20 DIAGNOSIS — M150 Primary generalized (osteo)arthritis: Secondary | ICD-10-CM | POA: Insufficient documentation

## 2021-03-20 DIAGNOSIS — Z8546 Personal history of malignant neoplasm of prostate: Secondary | ICD-10-CM | POA: Insufficient documentation

## 2021-03-20 DIAGNOSIS — E559 Vitamin D deficiency, unspecified: Secondary | ICD-10-CM | POA: Insufficient documentation

## 2021-03-20 DIAGNOSIS — M159 Polyosteoarthritis, unspecified: Secondary | ICD-10-CM | POA: Insufficient documentation

## 2021-03-29 ENCOUNTER — Encounter: Payer: Self-pay | Admitting: Gastroenterology

## 2021-04-02 ENCOUNTER — Encounter: Payer: Medicare HMO | Admitting: Gastroenterology

## 2021-04-03 ENCOUNTER — Encounter (HOSPITAL_COMMUNITY): Payer: Self-pay | Admitting: Radiology

## 2021-04-04 ENCOUNTER — Ambulatory Visit (AMBULATORY_SURGERY_CENTER): Payer: Medicare HMO | Admitting: Gastroenterology

## 2021-04-04 ENCOUNTER — Encounter: Payer: Self-pay | Admitting: Gastroenterology

## 2021-04-04 VITALS — BP 120/82 | HR 58 | Temp 97.8°F | Resp 15 | Ht 66.5 in | Wt 180.0 lb

## 2021-04-04 DIAGNOSIS — D122 Benign neoplasm of ascending colon: Secondary | ICD-10-CM

## 2021-04-04 DIAGNOSIS — D123 Benign neoplasm of transverse colon: Secondary | ICD-10-CM

## 2021-04-04 DIAGNOSIS — D124 Benign neoplasm of descending colon: Secondary | ICD-10-CM | POA: Diagnosis not present

## 2021-04-04 DIAGNOSIS — D125 Benign neoplasm of sigmoid colon: Secondary | ICD-10-CM | POA: Diagnosis not present

## 2021-04-04 DIAGNOSIS — Z86010 Personal history of colonic polyps: Secondary | ICD-10-CM

## 2021-04-04 MED ORDER — SODIUM CHLORIDE 0.9 % IV SOLN: 500.0000 mL | Freq: Once | INTRAVENOUS | Status: DC

## 2021-04-04 NOTE — Op Note (Signed)
[facility] Patient Name: Emma Schupp Procedure Date: 04/04/2021 1:25 PM MRN: [id number] Endoscopist: Ladene Artist , MD Age: 76 Referring MD:  Date of Birth: [birth year] Gender: Male Account #: [id number] Procedure:                Colonoscopy Indications:              Surveillance: Personal history of adenomatous                            polyps on last colonoscopy > 5 years ago Medicines:                Monitored Anesthesia Care Procedure:                Pre-Anesthesia Assessment:                           - Prior to the procedure, a History and Physical                            was performed, and patient medications and                            allergies were reviewed. The patient's tolerance of                            previous anesthesia was also reviewed. The risks                            and benefits of the procedure and the sedation                            options and risks were discussed with the patient.                            All questions were answered, and informed consent                            was obtained. Prior Anticoagulants: The patient has                            taken no previous anticoagulant or antiplatelet                            agents. ASA Grade Assessment: III - A patient with                            severe systemic disease. After reviewing the risks                            and benefits, the patient was deemed in                            satisfactory condition to undergo the procedure.  After obtaining informed consent, the colonoscope                            was passed under direct vision. Throughout the                            procedure, the patient's blood pressure, pulse, and                            oxygen saturations were monitored continuously. The                            Colonoscope was introduced through the anus and                            advanced to the the  cecum, identified by                            appendiceal orifice and ileocecal valve. The                            ileocecal valve, appendiceal orifice, and rectum                            were photographed. The quality of the bowel                            preparation was good. The colonoscopy was performed                            without difficulty. The patient tolerated the                            procedure well. Scope In: 1:39:59 PM Scope Out: 1:58:59 PM Scope Withdrawal Time: 0 hours 16 minutes 6 seconds  Total Procedure Duration: 0 hours 19 minutes 0 seconds  Findings:                 The perianal and digital rectal examinations were                            normal.                           Ten sessile polyps were found in the sigmoid colon                            (4), descending colon (2), transverse colon (3) and                            ascending colon (1). The polyps were 5 to 8 mm in                            size. These polyps were removed with a cold snare.  Resection and retrieval were complete.                           Internal hemorrhoids were found during                            retroflexion. The hemorrhoids were small and Grade                            I (internal hemorrhoids that do not prolapse).                           The exam was otherwise without abnormality on                            direct and retroflexion views. Complications:            No immediate complications. Estimated blood loss:                            None. Estimated Blood Loss:     Estimated blood loss: none. Impression:               - Ten 5 to 8 mm polyps in the sigmoid colon, in the                            descending colon, in the transverse colon and in                            the ascending colon, removed with a cold snare.                            Resected and retrieved.                           - Internal hemorrhoids.                            - The examination was otherwise normal on direct                            and retroflexion views. Recommendation:           - Repeat colonoscopy vs no repeat colonoscopy due                            to age after studies are complete for surveillance                            based on pathology results.                           - Patient has a contact number available for                            emergencies. The signs and symptoms of potential  delayed complications were discussed with the                            patient. Return to normal activities tomorrow.                            Written discharge instructions were provided to the                            patient.                           - Resume previous diet.                           - Continue present medications.                           - Await pathology results. Ladene Artist, MD 04/04/2021 2:03:30 PM This report has been signed electronically.

## 2021-04-04 NOTE — Progress Notes (Signed)
History & Physical  Primary Care Physician:  Lauree Chandler, NP Primary Gastroenterologist: Lucio Edward, MD  CHIEF COMPLAINT:  Personal history of colon polyps   HPI: Hunter Porter is a 76 y.o. male with a personal history of adenomatous colon polyps for surveillance colonoscopy.    Past Medical History:  Diagnosis Date   Adenomatous colon polyp    Allergy    Anxiety    Arthritis    Cataract    removed bilateral   GERD (gastroesophageal reflux disease)    Graves disease    Per [facility] New Patient Packet   H/O hemorrhoids    H/O hiatal hernia    Heart murmur 2007   per pt history- possible murmur by PCp- states negative stress test   Hyperlipidemia    Pneumonia    dx 03/08/21   Prostate cancer ([place]) 01/20/2013   Sleep apnea    CPAP.  Trying to use it. (NOT USING CURRENTLY )   DX YEARS AGO   Thyroid disease    graves   Ulcer approx 1970   HX peptic ulcer    Past Surgical History:  Procedure Laterality Date   CATARACT EXTRACTION W/ INTRAOCULAR LENS  IMPLANT, BILATERAL     COLONOSCOPY  2017   LYMPHADENECTOMY Bilateral 03/17/2013   Procedure: LYMPHADENECTOMY PELVIC LYMPH NODE DISSECTION;  Surgeon: [name], MD;  Location: [facility];  Service: Urology;  Laterality: Bilateral;   POLYPECTOMY     PROSTATE BIOPSY     ROBOT ASSISTED LAPAROSCOPIC RADICAL PROSTATECTOMY N/A 03/17/2013   Procedure: ROBOTIC ASSISTED LAPAROSCOPIC RADICAL PROSTATECTOMY;  Surgeon: [name], MD;  Location: [facility];  Service: Urology;  Laterality: N/A;   TONSILLECTOMY     TOTAL KNEE ARTHROPLASTY Left 10/15/2016   Procedure: TOTAL KNEE ARTHROPLASTY;  Surgeon: Renette Butters, MD;  Location: [facility];  Service: Orthopedics;  Laterality: Left;   TRANSURETHRAL RESECTION OF PROSTATE  12/01/2012   Dr Risa Grill   UPPER GASTROINTESTINAL ENDOSCOPY      Prior to Admission medications   Medication Sig Start Date End Date Taking? Authorizing Provider  cetirizine (ZYRTEC) 10 MG tablet Take 10 mg by  mouth daily.   Yes [provider]  cholecalciferol (VITAMIN D) 1000 units tablet Take 1,000 Units by mouth daily.   Yes [provider]  diclofenac Sodium (VOLTAREN) 1 % GEL APPLY 4 GRAMS TO AFFECTED AREA THREE TIMES A DAY AS NEEDED FOR L KNEE PAIN 09/06/20  Yes [provider]  esomeprazole (NEXIUM) 20 MG capsule Take by mouth.   Yes [provider]  gabapentin (NEURONTIN) 100 MG capsule TAKE ONE CAPSULE BY MOUTH EVERY MORNING AND TAKE THREE CAPSULES AT BEDTIME 06/15/20  Yes [provider]  hydrochlorothiazide (HYDRODIURIL) 25 MG tablet Take 1 tablet by mouth daily. 09/06/20  Yes [provider]  vitamin B-12 (CYANOCOBALAMIN) 1000 MCG tablet Take 1,000 mcg by mouth daily.   Yes [provider]  vitamin E 100 UNIT capsule Take by mouth daily.   Yes [provider]  ALPRAZolam Duanne Moron) 1 MG tablet TAKE 1/2 TABLET TWICE DAILY AS NEEDED. 12/24/16   [name] B, PA-C  EPINEPHrine 0.3 mg/0.3 mL IJ SOAJ injection Inject 0.3 mLs (0.3 mg total) into the muscle once. For allergic reaction to bee stings Patient not taking: Reported on 04/04/2021 11/02/13   Alycia Rossetti, MD    Current Outpatient Medications  Medication Sig Dispense Refill   cetirizine (ZYRTEC) 10 MG tablet Take 10 mg by  mouth daily.     cholecalciferol (VITAMIN D) 1000 units tablet Take 1,000 Units by mouth daily.     diclofenac Sodium (VOLTAREN) 1 % GEL APPLY 4 GRAMS TO AFFECTED AREA THREE TIMES A DAY AS NEEDED FOR L KNEE PAIN     esomeprazole (NEXIUM) 20 MG capsule Take by mouth.     gabapentin (NEURONTIN) 100 MG capsule TAKE ONE CAPSULE BY MOUTH EVERY MORNING AND TAKE THREE CAPSULES AT BEDTIME     hydrochlorothiazide (HYDRODIURIL) 25 MG tablet Take 1 tablet by mouth daily.     vitamin B-12 (CYANOCOBALAMIN) 1000 MCG tablet Take 1,000 mcg by mouth daily.     vitamin E 100 UNIT capsule Take by mouth daily.     ALPRAZolam (XANAX) 1 MG tablet TAKE 1/2 TABLET TWICE  DAILY AS NEEDED. 30 tablet 2   EPINEPHrine 0.3 mg/0.3 mL IJ SOAJ injection Inject 0.3 mLs (0.3 mg total) into the muscle once. For allergic reaction to bee stings (Patient not taking: Reported on 04/04/2021) 1 Device 3   Current Facility-Administered Medications  Medication Dose Route Frequency Provider Last Rate Last Admin   0.9 %  sodium chloride infusion  500 mL Intravenous Continuous Ladene Artist, MD       0.9 %  sodium chloride infusion  500 mL Intravenous Once Ladene Artist, MD        Allergies as of 04/04/2021 - Review Complete 04/04/2021  Allergen Reaction Noted   Bee venom Swelling 11/02/2013   Penicillins Rash and Other (See Comments) 11/02/2010   Hydrocodone-acetaminophen Nausea And Vomiting 03/17/2013    Family History  Problem Relation Age of Onset   Cancer Mother        brain   Cancer Brother        Question lung   Drug abuse Maternal Aunt    Colon polyps Neg Hx    Colon cancer Neg Hx    Rectal cancer Neg Hx    Stomach cancer Neg Hx    Esophageal cancer Neg Hx     Social History   Socioeconomic History   Marital status: Single    Spouse name: Not on file   Number of children: Not on file   Years of education: Not on file   Highest education level: Not on file  Occupational History   Not on file  Tobacco Use   Smoking status: Every Day    Packs/day: 0.50    Types: Cigarettes   Smokeless tobacco: Never  Vaping Use   Vaping Use: Never used  Substance and Sexual Activity   Alcohol use: Yes    Alcohol/week: 24.0 standard drinks    Types: 24 Cans of beer per week    Comment: 24 cans beer week   Drug use: No   Sexual activity: Not Currently  Other Topics Concern   Not on file  Social History Narrative   Retired. Plays golf 4 days/week.Works in yard. Drinks 6 beers each evening.      Diet: Left blank      Caffeine: yes      Married, if yes what year: Single      Do you live in a house, apartment, assisted living, condo, trailer, ect: House       Is it one or more stories: 2 stories      How many persons live in your home? 2 people      Pets: Yes, dog      Highest level or education completed: 2 years  of college      Current/Past profession: [employer]      Exercise:   Yes               Type and how often: Golf and weights         Living Will: No   DNR: No   POA/HPOA: No      Functional Status:   Do you have difficulty bathing or dressing yourself? No   Do you have difficulty preparing food or eating? No   Do you have difficulty managing your medications? No   Do you have difficulty managing your finances? No   Do you have difficulty affording your medications? No   Social Determinants of Radio broadcast assistant Strain: Not on file  Food Insecurity: Not on file  Transportation Needs: Not on file  Physical Activity: Not on file  Stress: Not on file  Social Connections: Not on file  Intimate Partner Violence: Not on file    Review of Systems:  All systems reviewed an negative except where noted in HPI.  Gen: Denies any fever, chills, sweats, anorexia, fatigue, weakness, malaise, weight loss, and sleep disorder CV: Denies chest pain, angina, palpitations, syncope, orthopnea, PND, peripheral edema, and claudication. Resp: Denies dyspnea at rest, dyspnea with exercise, cough, sputum, wheezing, coughing up blood, and pleurisy. GI: Denies vomiting blood, jaundice, and fecal incontinence.   Denies dysphagia or odynophagia. GU : Denies urinary burning, blood in urine, urinary frequency, urinary hesitancy, nocturnal urination, and urinary incontinence. MS: Denies joint pain, limitation of movement, and swelling, stiffness, low back pain, extremity pain. Denies muscle weakness, cramps, atrophy.  Derm: Denies rash, itching, dry skin, hives, moles, warts, or unhealing ulcers.  Psych: Denies depression, anxiety, memory loss, suicidal ideation, hallucinations, paranoia, and confusion. Heme: Denies bruising, bleeding,  and enlarged lymph nodes. Neuro:  Denies any headaches, dizziness, paresthesias. Endo:  Denies any problems with DM, thyroid, adrenal function.   Physical Exam: General:  Alert, well-developed, in NAD Head:  Normocephalic and atraumatic. Eyes:  Sclera clear, no icterus.   Conjunctiva pink. Ears:  Normal auditory acuity. Mouth:  No deformity or lesions.  Neck:  Supple; no masses . Lungs:  Clear throughout to auscultation.   No wheezes, crackles, or rhonchi. No acute distress. Heart:  Regular rate and rhythm; no murmurs. Abdomen:  Soft, nondistended, nontender. No masses, hepatomegaly. No obvious masses.  Normal bowel .    Rectal:  Deferred   Msk:  Symmetrical without gross deformities.. Pulses:  Normal pulses noted. Extremities:  Without edema. Neurologic:  Alert and  oriented x4;  grossly normal neurologically. Skin:  Intact without significant lesions or rashes. Cervical Nodes:  No significant cervical adenopathy. Psych:  Alert and cooperative. Normal mood and affect.   Impression / Plan:   Personal history of adenomatous colon polyps for surveillance colonoscopy.  Pricilla Riffle. Fuller Plan  04/04/2021, 1:34 PM See Shea Evans, [facility], to contact our on call provider

## 2021-04-04 NOTE — Progress Notes (Signed)
Pt non-responsive, VVS, Report to RN  °

## 2021-04-04 NOTE — Progress Notes (Signed)
Pt's states no medical or surgical changes since previsit or office visit. 

## 2021-04-04 NOTE — Progress Notes (Signed)
Called to room to assist during endoscopic procedure.  Patient ID and intended procedure confirmed with present staff. Received instructions for my participation in the procedure from the performing physician.  

## 2021-04-04 NOTE — Patient Instructions (Signed)
Handout on polyps given.     YOU HAD AN ENDOSCOPIC PROCEDURE TODAY AT [facility]:   Refer to the procedure report that was given to you for any specific questions about what was found during the examination.  If the procedure report does not answer your questions, please call your gastroenterologist to clarify.  If you requested that your care partner not be given the details of your procedure findings, then the procedure report has been included in a sealed envelope for you to review at your convenience later.  YOU SHOULD EXPECT: Some feelings of bloating in the abdomen. Passage of more gas than usual.  Walking can help get rid of the air that was put into your GI tract during the procedure and reduce the bloating. If you had a lower endoscopy (such as a colonoscopy or flexible sigmoidoscopy) you may notice spotting of blood in your stool or on the toilet paper. If you underwent a bowel prep for your procedure, you may not have a normal bowel movement for a few days.  Please Note:  You might notice some irritation and congestion in your nose or some drainage.  This is from the oxygen used during your procedure.  There is no need for concern and it should clear up in a day or so.  SYMPTOMS TO REPORT IMMEDIATELY:  Following lower endoscopy (colonoscopy or flexible sigmoidoscopy):  Excessive amounts of blood in the stool  Significant tenderness or worsening of abdominal pains  Swelling of the abdomen that is new, acute  Fever of 100F or higher   be reached at any hour by calling [phone]. Do not use MyChart messaging for urgent concerns.    DIET:  We do recommend a small meal at first, but then you may proceed to your regular diet.  Drink plenty of fluids but you should avoid alcoholic beverages for 24 hours.  ACTIVITY:  You should plan to take it easy for the rest of today and you should NOT DRIVE or use heavy machinery until tomorrow (because of the sedation medicines  used during the test).    FOLLOW UP: Our staff will call the number listed on your records 48-72 hours following your procedure to check on you and address any questions or concerns that you may have regarding the information given to you following your procedure. If we do not reach you, we will leave a message.  We will attempt to reach you two times.  During this call, we will ask if you have developed any symptoms of COVID 19. If you develop any symptoms (ie: fever, flu-like symptoms, shortness of breath, cough etc.) before then, please call [phone].  If you test positive for Covid 19 in the 2 weeks post procedure, please call and report this information to [place].    If any biopsies were taken you will be contacted by phone or by letter within the next 1-3 weeks.  Please call us at [phone] if you have not heard about the biopsies in 3 weeks.    SIGNATURES/CONFIDENTIALITY: You and/or your care partner have signed paperwork which will be entered into your electronic medical record.  These signatures attest to the fact that that the information above on your After Visit Summary has been reviewed and is understood.  Full responsibility of the confidentiality of this discharge information lies with you and/or your care-partner.

## 2021-04-06 ENCOUNTER — Telehealth: Payer: Self-pay

## 2021-04-06 NOTE — Telephone Encounter (Signed)
°  Follow up Call-  Call back number 04/04/2021  Post procedure Call Back phone  # [phone]  Permission to leave phone message Yes  Some recent data might be hidden     Patient questions:  Do you have a fever, pain , or abdominal swelling? No. Pain Score  0 *  Have you tolerated food without any problems? Yes.    Have you been able to return to your normal activities? Yes.    Do you have any questions about your discharge instructions: Diet   No. Medications  No. Follow up visit  No.  Do you have questions or concerns about your Care? No.  Actions: * If pain score is 4 or above: No action needed, pain <4.  Have you developed a fever since your procedure? no  2.   Have you had an respiratory symptoms (SOB or cough) since your procedure? no  3.   Have you tested positive for COVID 19 since your procedure no  4.   Have you had any family members/close contacts diagnosed with the COVID 19 since your procedure?  no   If yes to any of these questions please route to Joylene John, RN and Joella Prince, RN

## 2021-04-22 ENCOUNTER — Encounter: Payer: Self-pay | Admitting: Gastroenterology

## 2021-09-17 ENCOUNTER — Encounter: Payer: Medicare HMO | Admitting: Nurse Practitioner

## 2021-09-17 ENCOUNTER — Encounter: Payer: Self-pay | Admitting: Nurse Practitioner

## 2021-09-18 NOTE — Progress Notes (Signed)
err

## 2021-10-08 ENCOUNTER — Encounter: Payer: Self-pay | Admitting: Nurse Practitioner

## 2021-10-08 ENCOUNTER — Ambulatory Visit (INDEPENDENT_AMBULATORY_CARE_PROVIDER_SITE_OTHER): Payer: Medicare HMO | Admitting: Nurse Practitioner

## 2021-10-08 VITALS — BP 128/70 | HR 71 | Temp 97.8°F | Ht 66.5 in | Wt 177.8 lb

## 2021-10-08 DIAGNOSIS — R69 Illness, unspecified: Secondary | ICD-10-CM | POA: Diagnosis not present

## 2021-10-08 DIAGNOSIS — Z8546 Personal history of malignant neoplasm of prostate: Secondary | ICD-10-CM | POA: Diagnosis not present

## 2021-10-08 DIAGNOSIS — R310 Gross hematuria: Secondary | ICD-10-CM

## 2021-10-08 DIAGNOSIS — Z720 Tobacco use: Secondary | ICD-10-CM | POA: Diagnosis not present

## 2021-10-08 DIAGNOSIS — M159 Polyosteoarthritis, unspecified: Secondary | ICD-10-CM | POA: Diagnosis not present

## 2021-10-08 DIAGNOSIS — F1010 Alcohol abuse, uncomplicated: Secondary | ICD-10-CM

## 2021-10-08 LAB — POCT URINALYSIS DIPSTICK
Bilirubin, UA: NEGATIVE
Glucose, UA: NEGATIVE
Ketones, UA: NEGATIVE
Leukocytes, UA: NEGATIVE
Nitrite, UA: NEGATIVE
Protein, UA: POSITIVE — AB
Spec Grav, UA: 1.02 (ref 1.010–1.025)
Urobilinogen, UA: 1 E.U./dL
pH, UA: 6 (ref 5.0–8.0)

## 2021-10-08 MED ORDER — MUPIROCIN 2 % EX OINT: 1.0000 | TOPICAL_OINTMENT | Freq: Two times a day (BID) | CUTANEOUS | 0 refills | Status: DC

## 2021-10-08 NOTE — Progress Notes (Unsigned)
Careteam: Patient Care Team: Lauree Chandler, NP as PCP - General (Geriatric Medicine)  PLACE OF SERVICE:  [place]  Advanced Directive information    Allergies  Allergen Reactions   Bee Venom Swelling    Throat swells    Penicillins Rash and Other (See Comments)    PATIENT HAS HAD A PCN REACTION WITH IMMEDIATE RASH, FACIAL/TONGUE/THROAT SWELLING, SOB, OR LIGHTHEADEDNESS WITH HYPOTENSION:  #  #  #  YES  #  #  #   Has patient had a PCN reaction causing severe rash involving mucus membranes or skin necrosis: No Has patient had a PCN reaction that required hospitalization: No Has patient had a PCN reaction occurring within the last 10 years: No If all of the above answers are "NO", then may proceed with Cephalosporin use.    Hydrocodone-Acetaminophen Nausea And Vomiting    Chief Complaint  Patient presents with   Medical Management of Chronic Issues    6 month follow-up. Discuss need for covid boosters (refused) and flu vaccine (out of stock). Refill medications. Requesting for OV notes from today to be forwarded to [facility])       HPI: Patient is a 76 y.o. male for routine follow up.  He is still following at [facility] yearly for physical.  See neurologist for neuropathy   Hs of prostate cancer, s/p radical prostatectomy. Reports he had blood in urine last year and found to have UTI. Reports he has blood in his urine that comes and goes.  Reports in the last week he has had some.  No frequency, burning, abdominal pain.   Continues to drink ETOH but trying to cut back, also plans to see psychiatrist.   Continues to smoke about 1/2 ppd  [facility]   Staying active. Does routine exercise.  Review of Systems:  Review of Systems  Constitutional:  Negative for chills, fever and weight loss.  HENT:  Negative for tinnitus.   Respiratory:  Negative for cough, sputum production and shortness of breath.    Cardiovascular:  Negative for chest pain, palpitations and leg swelling.  Gastrointestinal:  Negative for abdominal pain, constipation, diarrhea and heartburn.  Genitourinary:  Positive for hematuria. Negative for dysuria, frequency and urgency.  Musculoskeletal:  Negative for back pain, falls, joint pain and myalgias.  Skin: Negative.   Neurological:  Negative for dizziness and headaches.  Psychiatric/Behavioral:  Negative for depression and memory loss. The patient does not have insomnia.     Past Medical History:  Diagnosis Date   Adenomatous colon polyp    Allergy    Anxiety    Arthritis    Cataract    removed bilateral   GERD (gastroesophageal reflux disease)    Graves disease    Per [facility] New Patient Packet   H/O hemorrhoids    H/O hiatal hernia    Heart murmur 2007   per pt history- possible murmur by PCp- states negative stress test   Hyperlipidemia    Pneumonia    dx 03/08/21   Prostate cancer ([place]) 01/20/2013   Sleep apnea    CPAP.  Trying to use it. (NOT USING CURRENTLY )   DX YEARS AGO   Thyroid disease    graves   Ulcer approx 1970   HX peptic ulcer   Past Surgical History:  Procedure Laterality Date   CATARACT EXTRACTION W/ INTRAOCULAR LENS  IMPLANT, BILATERAL     COLONOSCOPY  2017  LYMPHADENECTOMY Bilateral 03/17/2013   Procedure: LYMPHADENECTOMY PELVIC LYMPH NODE DISSECTION;  Surgeon: [name], MD;  Location: [facility];  Service: Urology;  Laterality: Bilateral;   POLYPECTOMY     PROSTATE BIOPSY     ROBOT ASSISTED LAPAROSCOPIC RADICAL PROSTATECTOMY N/A 03/17/2013   Procedure: ROBOTIC ASSISTED LAPAROSCOPIC RADICAL PROSTATECTOMY;  Surgeon: [name], MD;  [facility];  Service: Urology;  Laterality: N/A;   TONSILLECTOMY     TOTAL KNEE ARTHROPLASTY Left 10/15/2016   Procedure: TOTAL KNEE ARTHROPLASTY;  Surgeon: Renette Butters, MD;  Location: [facility];  Service: Orthopedics;  Laterality: Left;   TRANSURETHRAL RESECTION OF PROSTATE  12/01/2012    Dr Risa Grill   UPPER GASTROINTESTINAL ENDOSCOPY     Social History:   reports that he has been smoking cigarettes. He has been smoking an average of .5 packs per day. He has never used smokeless tobacco. He reports current alcohol use of about 24.0 standard drinks of alcohol per week. He reports that he does not use drugs.  Family History  Problem Relation Age of Onset   Cancer Mother        brain   Cancer Brother        Question lung   Drug abuse Maternal Aunt    Colon polyps Neg Hx    Colon cancer Neg Hx    Rectal cancer Neg Hx    Stomach cancer Neg Hx    Esophageal cancer Neg Hx     Medications: Patient's Medications  New Prescriptions   No medications on file  Previous Medications   ALPRAZOLAM (XANAX) 1 MG TABLET    TAKE 1/2 TABLET TWICE DAILY AS NEEDED.   BETAMETHASONE DIPROPIONATE (DIPROLENE) 0.05 % OINTMENT    Apply 1 Application topically daily.   CETIRIZINE (ZYRTEC) 10 MG TABLET    Take 10 mg by mouth daily.   CHOLECALCIFEROL (VITAMIN D) 1000 UNITS TABLET    Take 1,000 Units by mouth daily.   CLOBETASOL PROPIONATE 0.025 % CREA    Apply 1 Application topically as needed (1-2 times daily for foot condition).   DICLOFENAC SODIUM (VOLTAREN) 1 % GEL    APPLY 4 GRAMS TO AFFECTED AREA THREE TIMES A DAY AS NEEDED FOR L KNEE PAIN   EPINEPHRINE 0.3 MG/0.3 ML IJ SOAJ INJECTION    Inject 0.3 mLs (0.3 mg total) into the muscle once. For allergic reaction to bee stings   ESOMEPRAZOLE (NEXIUM) 20 MG CAPSULE    Take by mouth.   GABAPENTIN (NEURONTIN) 100 MG CAPSULE    TAKE ONE CAPSULE BY MOUTH EVERY MORNING AND TAKE THREE CAPSULES AT BEDTIME   HYDROCHLOROTHIAZIDE (HYDRODIURIL) 25 MG TABLET    Take 1 tablet by mouth daily.   VITAMIN B-12 (CYANOCOBALAMIN) 1000 MCG TABLET    Take 1,000 mcg by mouth daily.   VITAMIN E 100 UNIT CAPSULE    Take by mouth daily.  Modified Medications   No medications on file  Discontinued Medications   No medications on file    Physical Exam:  Vitals:    10/08/21 1025  BP: 128/70  Pulse: 71  Temp: 97.8 F (36.6 C)  TempSrc: Temporal  SpO2: 96%  Weight: 177 lb 12.8 oz (80.6 kg)  Height: 5' 6.5" (1.689 m)   Body mass index is 28.27 kg/m. Wt Readings from Last 3 Encounters:  10/08/21 177 lb 12.8 oz (80.6 kg)  04/04/21 180 lb (81.6 kg)  03/19/21 186 lb (84.4 kg)    Physical Exam Constitutional:  General: He is not in acute distress.    Appearance: He is well-developed. He is not diaphoretic.  HENT:     Head: Normocephalic and atraumatic.     Right Ear: External ear normal.     Left Ear: External ear normal.     Mouth/Throat:     Pharynx: No oropharyngeal exudate.  Eyes:     Conjunctiva/sclera: Conjunctivae normal.     Pupils: Pupils are equal, round, and reactive to light.  Cardiovascular:     Rate and Rhythm: Normal rate and regular rhythm.     Heart sounds: Normal heart sounds.  Pulmonary:     Effort: Pulmonary effort is normal.     Breath sounds: Normal breath sounds.  Abdominal:     General: Bowel sounds are normal.     Palpations: Abdomen is soft.  Musculoskeletal:        General: No tenderness.     Cervical back: Normal range of motion and neck supple.     Right lower leg: No edema.     Left lower leg: No edema.  Skin:    General: Skin is warm and dry.  Neurological:     Mental Status: He is alert and oriented to person, place, and time.     Labs reviewed: Basic Metabolic Panel: Recent Labs    03/19/21 1428  NA 140  K 3.9  CL 103  CO2 29  GLUCOSE 78  BUN 13  CREATININE 0.87  CALCIUM 8.7  TSH 3.49   Liver Function Tests: Recent Labs    03/19/21 1428  AST 31  ALT 17  BILITOT 0.5  PROT 6.5   No results for input(s): "LIPASE", "AMYLASE" in the last 8760 hours. No results for input(s): "AMMONIA" in the last 8760 hours. CBC: Recent Labs    03/19/21 1428  WBC 3.1*  NEUTROABS 2,139  HGB 13.5  HCT 38.5  MCV 96.7  PLT 158   Lipid Panel: Recent Labs    03/19/21 1428  CHOL 158   HDL 77  LDLCALC 65  TRIG 80  CHOLHDL 2.1   TSH: Recent Labs    03/19/21 1428  TSH 3.49   A1C: No results found for: "HGBA1C"   Assessment/Plan 1. Gross hematuria -recommended to call urology and make follow up - POC Urinalysis Dipstick- shows blood in urine, culture sent - Culture, Urine  2. Tobacco abuse -encouraged cessation.  3. History of prostate cancer PSA  < 0.04 on last labs, recommended urology follow up.    4. Primary osteoarthritis involving multiple joints The current medical regimen is effective;  continue present plan and medications.  5. ETOH abuse -he is planning on meeting with psychiatirst to discuss options to decrease alcohol use.    Return in about 6 months (around 04/10/2022) for routine follow up . Carlos American. [place] [place] [phone]

## 2021-10-09 LAB — URINE CULTURE
MICRO NUMBER:: 13775968
Result:: NO GROWTH
SPECIMEN QUALITY:: ADEQUATE

## 2021-10-17 DIAGNOSIS — R310 Gross hematuria: Secondary | ICD-10-CM | POA: Diagnosis not present

## 2021-10-22 DIAGNOSIS — R311 Benign essential microscopic hematuria: Secondary | ICD-10-CM | POA: Diagnosis not present

## 2021-10-22 DIAGNOSIS — R310 Gross hematuria: Secondary | ICD-10-CM | POA: Diagnosis not present

## 2021-11-07 DIAGNOSIS — R319 Hematuria, unspecified: Secondary | ICD-10-CM | POA: Diagnosis not present

## 2021-11-07 DIAGNOSIS — R310 Gross hematuria: Secondary | ICD-10-CM | POA: Diagnosis not present

## 2021-11-23 ENCOUNTER — Other Ambulatory Visit: Payer: Self-pay | Admitting: Nurse Practitioner

## 2021-11-23 NOTE — Telephone Encounter (Signed)
Patient has request refill on medication Mupirocin Ointment 2%. Patient last refill on medication 10/08/2021. Medication pend and sent to Marlowe Sax, NP due to PCP Dewaine Oats Carlos American, NP being out of office.

## 2022-03-14 ENCOUNTER — Telehealth: Payer: Medicare HMO | Admitting: *Deleted

## 2022-03-14 ENCOUNTER — Other Ambulatory Visit: Payer: Self-pay | Admitting: Family

## 2022-03-14 NOTE — Telephone Encounter (Signed)
Patient called requesting Disability Paperwork to be filled out.  I checked with Sherrie Mustache, NP regarding Paperwork and she stated:  2 mins Lauree Chandler, NP We do not do disability Paperwork you have to be certified to do disability   Patient Notified and stated that he had an appointment with the [facility] 03/15/2022 and he would have them do it. Agreed.

## 2022-04-12 ENCOUNTER — Ambulatory Visit (INDEPENDENT_AMBULATORY_CARE_PROVIDER_SITE_OTHER): Payer: Medicare HMO | Admitting: Nurse Practitioner

## 2022-04-12 ENCOUNTER — Encounter: Payer: Self-pay | Admitting: Nurse Practitioner

## 2022-04-12 VITALS — BP 132/80 | HR 65 | Temp 97.3°F | Ht 66.5 in | Wt 179.0 lb

## 2022-04-12 DIAGNOSIS — E785 Hyperlipidemia, unspecified: Secondary | ICD-10-CM

## 2022-04-12 DIAGNOSIS — M159 Polyosteoarthritis, unspecified: Secondary | ICD-10-CM | POA: Diagnosis not present

## 2022-04-12 DIAGNOSIS — I10 Essential (primary) hypertension: Secondary | ICD-10-CM | POA: Diagnosis not present

## 2022-04-12 DIAGNOSIS — R310 Gross hematuria: Secondary | ICD-10-CM | POA: Diagnosis not present

## 2022-04-12 DIAGNOSIS — F1010 Alcohol abuse, uncomplicated: Secondary | ICD-10-CM

## 2022-04-12 DIAGNOSIS — R69 Illness, unspecified: Secondary | ICD-10-CM | POA: Diagnosis not present

## 2022-04-12 DIAGNOSIS — R058 Other specified cough: Secondary | ICD-10-CM | POA: Diagnosis not present

## 2022-04-12 DIAGNOSIS — Z720 Tobacco use: Secondary | ICD-10-CM

## 2022-04-12 DIAGNOSIS — Z8546 Personal history of malignant neoplasm of prostate: Secondary | ICD-10-CM

## 2022-04-12 DIAGNOSIS — E079 Disorder of thyroid, unspecified: Secondary | ICD-10-CM | POA: Diagnosis not present

## 2022-04-12 DIAGNOSIS — F419 Anxiety disorder, unspecified: Secondary | ICD-10-CM | POA: Diagnosis not present

## 2022-04-12 NOTE — Progress Notes (Signed)
Careteam: Patient Care Team: Lauree Chandler, NP as PCP - General (Geriatric Medicine) Ceasar Mons, MD as Consulting Physician (Urology)  PLACE OF SERVICE:  [place] Directive information Does Patient Have a Medical Advance Directive?: No, Would patient like information on creating a medical advance directive?: No - Patient declined  Allergies  Allergen Reactions   Bee Venom Swelling    Throat swells    Penicillins Rash and Other (See Comments)    PATIENT HAS HAD A PCN REACTION WITH IMMEDIATE RASH, FACIAL/TONGUE/THROAT SWELLING, SOB, OR LIGHTHEADEDNESS WITH HYPOTENSION:  #  #  #  YES  #  #  #   Has patient had a PCN reaction causing severe rash involving mucus membranes or skin necrosis: No Has patient had a PCN reaction that required hospitalization: No Has patient had a PCN reaction occurring within the last 10 years: No If all of the above answers are "NO", then may proceed with Cephalosporin use.    Hydrocodone-Acetaminophen Nausea And Vomiting    Chief Complaint  Patient presents with   Medical Management of Chronic Issues    6 month follow-up. Discuss need for AWV and flu vaccine( refused)  or post pone if patient refuses or is not a candidate.      HPI: Patient is a 77 y.o. male here for routine medical management of chronic issues.   He saw urology. He says they did a scan of his bladder and didn't find anything. He still has just a little bit of blood in his urine that seems to occur with strong activity. Denies blood in stool. Denies pain with urination. Denies abdominal pain. No constipation or diarrhea.   He is seeing a psychiatrist regularly at the [place] about every 4 months. They were trying to help him decrease his alcohol intake with a medication but he can't remember the  name of the medication. He was unable to tolerate the medicine so he stopped taking it. It made him feel 'loopy' and strange and he didn't like it. He has cut back  to about 4 oz of liquor and quit drinking beer.   He is still smoking about 1/2 a ppd or less. Got a cold about 5 weeks ago, still has a lingering cough. Clear productive cough mostly in the morning. Tested negative for covid. Took mucinex for a while and it helped.   Dr. Elon Alas is his primary at the [facility] and he will see her this month to see if he can get some massage therapy for his L knee, he had a replacement on it and it seems to ache occasionally. He takes naproxen and uses voltaren cream.   Sleeping ok, uses melatonin and some 'PM' medications, the melatonin helps a lot.    Review of Systems:  Review of Systems  Constitutional:  Negative for chills, fever, malaise/fatigue and weight loss.  HENT:  Negative for congestion and sore throat.   Eyes:  Negative for blurred vision.  Respiratory:  Positive for cough. Negative for shortness of breath and wheezing.   Cardiovascular:  Negative for chest pain, palpitations and leg swelling.  Gastrointestinal:  Negative for abdominal pain, blood in stool, constipation, diarrhea, heartburn, nausea and vomiting.  Genitourinary:  Positive for hematuria. Negative for dysuria, frequency and urgency.  Musculoskeletal:  Negative for falls and joint pain.  Skin:  Negative for rash.  Neurological:  Negative for dizziness, tingling and headaches.  Endo/Heme/Allergies:  Negative for polydipsia.  Psychiatric/Behavioral:  Negative for  depression. The patient is not nervous/anxious.     Past Medical History:  Diagnosis Date   Adenomatous colon polyp    Allergy    Anxiety    Arthritis    Cataract    removed bilateral   GERD (gastroesophageal reflux disease)    Graves disease    Per [facility] New Patient Packet   H/O hemorrhoids    H/O hiatal hernia    Heart murmur 2007   per pt history- possible murmur by PCp- states negative stress test   Hyperlipidemia    Pneumonia    dx 03/08/21   Prostate cancer ([place]) 01/20/2013   Sleep apnea    CPAP.  Trying to use  it. (NOT USING CURRENTLY )   DX YEARS AGO   Thyroid disease    graves   Ulcer approx 1970   HX peptic ulcer   Past Surgical History:  Procedure Laterality Date   CATARACT EXTRACTION W/ INTRAOCULAR LENS  IMPLANT, BILATERAL     COLONOSCOPY  2017   LYMPHADENECTOMY Bilateral 03/17/2013   Procedure: LYMPHADENECTOMY PELVIC LYMPH NODE DISSECTION;  Surgeon: [name], MD;  [facility];  Service: Urology;  Laterality: Bilateral;   POLYPECTOMY     PROSTATE BIOPSY     ROBOT ASSISTED LAPAROSCOPIC RADICAL PROSTATECTOMY N/A 03/17/2013   Procedure: ROBOTIC ASSISTED LAPAROSCOPIC RADICAL PROSTATECTOMY;  Surgeon: [name], MD;  [facility];  Service: Urology;  Laterality: N/A;   TONSILLECTOMY     TOTAL KNEE ARTHROPLASTY Left 10/15/2016   Procedure: TOTAL KNEE ARTHROPLASTY;  Surgeon: Renette Butters, MD;  Location: [place];  Service: Orthopedics;  Laterality: Left;   TRANSURETHRAL RESECTION OF PROSTATE  12/01/2012   Dr Risa Grill   UPPER GASTROINTESTINAL ENDOSCOPY     Social History:   reports that he has been smoking cigarettes. He has been smoking an average of .5 packs per day. He has never used smokeless tobacco. He reports that he does not currently use alcohol. He reports that he does not use drugs.  Family History  Problem Relation Age of Onset   Cancer Mother        brain   Cancer Brother        Question lung   Drug abuse Maternal Aunt    Colon polyps Neg Hx    Colon cancer Neg Hx    Rectal cancer Neg Hx    Stomach cancer Neg Hx    Esophageal cancer Neg Hx     Medications: Patient's Medications  New Prescriptions   No medications on file  Previous Medications   ALPRAZOLAM (XANAX) 1 MG TABLET    TAKE 1/2 TABLET TWICE DAILY AS NEEDED.   CETIRIZINE (ZYRTEC) 10 MG TABLET    Take 10 mg by mouth daily.   CHOLECALCIFEROL (VITAMIN D) 1000 UNITS TABLET    Take 1,000 Units by mouth daily.   DICLOFENAC SODIUM (VOLTAREN) 1 % GEL    APPLY 4 GRAMS TO AFFECTED AREA THREE TIMES  A DAY AS NEEDED FOR L KNEE PAIN   EPINEPHRINE 0.3 MG/0.3 ML IJ SOAJ INJECTION    Inject 0.3 mLs (0.3 mg total) into the muscle once. For allergic reaction to bee stings   ESOMEPRAZOLE (NEXIUM) 20 MG CAPSULE    Take 20 mg by mouth daily.   GABAPENTIN (NEURONTIN) 100 MG CAPSULE    TAKE ONE CAPSULE BY MOUTH EVERY MORNING AND TAKE THREE CAPSULES AT BEDTIME   HYDROCHLOROTHIAZIDE (HYDRODIURIL) 25 MG TABLET    Take 1 tablet  by mouth daily.   MUPIROCIN OINTMENT (BACTROBAN) 2 %    APPLY TOPICALLY TO THE AFFECTED AREA TWICE DAILY   VITAMIN B-12 (CYANOCOBALAMIN) 1000 MCG TABLET    Take 1,000 mcg by mouth daily.   VITAMIN E 100 UNIT CAPSULE    Take by mouth daily.  Modified Medications   No medications on file  Discontinued Medications   BETAMETHASONE DIPROPIONATE (DIPROLENE) 0.05 % OINTMENT    Apply 1 Application topically daily.   CLOBETASOL PROPIONATE 0.025 % CREA    Apply 1 Application topically as needed (1-2 times daily for foot condition).    Physical Exam:  Vitals:   04/12/22 0953  BP: 132/80  Pulse: 65  Temp: (!) 97.3 F (36.3 C)  TempSrc: Temporal  SpO2: 92%  Weight: 179 lb (81.2 kg)  Height: 5' 6.5" (1.689 m)   Body mass index is 28.46 kg/m. Wt Readings from Last 3 Encounters:  04/12/22 179 lb (81.2 kg)  10/08/21 177 lb 12.8 oz (80.6 kg)  04/04/21 180 lb (81.6 kg)    Physical Exam Vitals and nursing note reviewed. Exam conducted with a chaperone present.  Cardiovascular:     Rate and Rhythm: Normal rate and regular rhythm.     Heart sounds: Normal heart sounds. No murmur heard. Pulmonary:     Breath sounds: Normal breath sounds. No wheezing or rales.  Abdominal:     General: Bowel sounds are normal.     Palpations: Abdomen is soft. There is no mass.     Tenderness: There is no abdominal tenderness. There is no guarding.  Musculoskeletal:        General: No swelling or tenderness.  Skin:    General: Skin is warm and dry.  Neurological:     Mental Status: He is alert  and oriented to person, place, and time. Mental status is at baseline.  Psychiatric:        Mood and Affect: Mood normal.        Behavior: Behavior normal.        Judgment: Judgment normal.     Labs reviewed: Basic Metabolic Panel: No results for input(s): "NA", "K", "CL", "CO2", "GLUCOSE", "BUN", "CREATININE", "CALCIUM", "MG", "PHOS", "TSH" in the last 8760 hours. Liver Function Tests: No results for input(s): "AST", "ALT", "ALKPHOS", "BILITOT", "PROT", "ALBUMIN" in the last 8760 hours. No results for input(s): "LIPASE", "AMYLASE" in the last 8760 hours. No results for input(s): "AMMONIA" in the last 8760 hours. CBC: No results for input(s): "WBC", "NEUTROABS", "HGB", "HCT", "MCV", "PLT" in the last 8760 hours. Lipid Panel: No results for input(s): "CHOL", "HDL", "LDLCALC", "TRIG", "CHOLHDL", "LDLDIRECT" in the last 8760 hours. TSH: No results for input(s): "TSH" in the last 8760 hours. A1C: No results found for: "HGBA1C"   Assessment/Plan 1. Gross hematuria Patient is following with Dr. Gilford Rile at [facility]. No reason found for hematuria, urology did cystoscope and CT and continues to follow up with urology. Patient to contact urologist if bleeding worsens or he develops issues with urination.   2. History of prostate cancer Follows with [facility]. PSA has been stable.   3. ETOH abuse Managing with psych. Improving.   4. Anxiety Follows with psych at [facility] every 4 months. Stable  5. Primary osteoarthritis involving multiple joints Plans to see his PCP at the [facility] to see about massage therapy for his left knee.  Continues on voltaren gel and tylenol PRN - COMPLETE METABOLIC PANEL WITH GFR - CBC with Differential/Platelet  6. Cough present  for greater than 3 weeks Post-URI, likely. Patient reports mucinex helped. He can take this again as needed. Patient to return should he develop shortness of breath or worsening symptoms.   7. Dyslipidemia Not on medication.  Will follow up lipids today.  - Lipid panel - COMPLETE METABOLIC PANEL WITH GFR  8. Thyroid disease Stable. Hx of Graves dz. Not on medication at this time.  -TSH stable on last labs.   9. Tobacco abuse Continues to try to cut back on his own. Encouraged patient to continue and he is aware that we are here for support.  10. Primary hypertension Well-controlled on hydrochlorothiazide with dietary modifications.  - COMPLETE METABOLIC PANEL WITH GFR - CBC with Differential/Platelet   Return in about 6 months (around 10/11/2022) for routine .  Student- Archer Asa O'Berry ACPCNP-S  I personally was present during the history, physical exam and medical decision-making activities of this service and have verified that the service and findings are accurately documented in the student's note Aubree Doody K. Ward, [facility] [phone]

## 2022-04-13 LAB — LIPID PANEL
Cholesterol: 198 mg/dL (ref <200)
HDL: 101 mg/dL (ref >=40)
LDL Cholesterol (Calc): 81 mg/dL (calc)
Non-HDL Cholesterol (Calc): 97 mg/dL (calc) (ref <130)
Total CHOL/HDL Ratio: 2 (calc) (ref <5.0)
Triglycerides: 82 mg/dL (ref <150)

## 2022-04-13 LAB — COMPLETE METABOLIC PANEL WITH GFR
AG Ratio: 1.5 (calc) (ref 1.0–2.5)
ALT: 16 U/L (ref 9–46)
AST: 29 U/L (ref 10–35)
Albumin: 4.4 g/dL (ref 3.6–5.1)
Alkaline phosphatase (APISO): 62 U/L (ref 35–144)
BUN: 14 mg/dL (ref 7–25)
CO2: 27 mmol/L (ref 20–32)
Calcium: 9.6 mg/dL (ref 8.6–10.3)
Chloride: 101 mmol/L (ref 98–110)
Creat: 1.01 mg/dL (ref 0.70–1.28)
Globulin: 2.9 g/dL (calc) (ref 1.9–3.7)
Glucose, Bld: 84 mg/dL (ref 65–139)
Potassium: 3.6 mmol/L (ref 3.5–5.3)
Sodium: 141 mmol/L (ref 135–146)
Total Bilirubin: 0.4 mg/dL (ref 0.2–1.2)
Total Protein: 7.3 g/dL (ref 6.1–8.1)
eGFR: 78 mL/min/{1.73_m2} (ref >=60)

## 2022-04-13 LAB — CBC WITH DIFFERENTIAL/PLATELET
Absolute Monocytes: 348 cells/uL (ref 200–950)
Basophils Absolute: 30 cells/uL (ref 0–200)
Basophils Relative: 0.8 %
Eosinophils Absolute: 170 cells/uL (ref 15–500)
Eosinophils Relative: 4.6 %
HCT: 40.8 % (ref 38.5–50.0)
Hemoglobin: 14.2 g/dL (ref 13.2–17.1)
Lymphs Abs: 707 cells/uL — ABNORMAL LOW (ref 850–3900)
MCH: 33.7 pg — ABNORMAL HIGH (ref 27.0–33.0)
MCHC: 34.8 g/dL (ref 32.0–36.0)
MCV: 96.9 fL (ref 80.0–100.0)
MPV: 9.2 fL (ref 7.5–12.5)
Monocytes Relative: 9.4 %
Neutro Abs: 2446 cells/uL (ref 1500–7800)
Neutrophils Relative %: 66.1 %
Platelets: 199 10*3/uL (ref 140–400)
RBC: 4.21 10*6/uL (ref 4.20–5.80)
RDW: 12.5 % (ref 11.0–15.0)
Total Lymphocyte: 19.1 %
WBC: 3.7 10*3/uL — ABNORMAL LOW (ref 3.8–10.8)

## 2022-06-17 ENCOUNTER — Other Ambulatory Visit: Payer: Self-pay | Admitting: Nurse Practitioner

## 2022-06-17 ENCOUNTER — Encounter: Payer: Self-pay | Admitting: Family

## 2022-06-17 ENCOUNTER — Ambulatory Visit (INDEPENDENT_AMBULATORY_CARE_PROVIDER_SITE_OTHER): Payer: Medicare HMO | Admitting: Family

## 2022-06-17 VITALS — BP 110/62 | HR 78 | Temp 98.7°F | Ht 66.5 in | Wt 183.0 lb

## 2022-06-17 DIAGNOSIS — G4700 Insomnia, unspecified: Secondary | ICD-10-CM

## 2022-06-17 DIAGNOSIS — R112 Nausea with vomiting, unspecified: Secondary | ICD-10-CM | POA: Diagnosis not present

## 2022-06-17 DIAGNOSIS — F5101 Primary insomnia: Secondary | ICD-10-CM

## 2022-06-17 MED ORDER — ALPRAZOLAM 1 MG PO TABS: ORAL_TABLET | ORAL | 2 refills | Status: DC

## 2022-06-17 MED ORDER — ONDANSETRON HCL 4 MG PO TABS: 4.0000 mg | ORAL_TABLET | Freq: Three times a day (TID) | ORAL | 0 refills | Status: DC | PRN

## 2022-06-17 MED ORDER — MUPIROCIN 2 % EX OINT: TOPICAL_OINTMENT | CUTANEOUS | 1 refills | Status: DC

## 2022-06-17 NOTE — Patient Instructions (Signed)
Increase fluid intake and advance diet as tolerated to solid food. - Notify provider  or got to ED if symptoms worsen or fail to improve  Bland Diet A bland diet may consist of soft foods or foods that are not high in fat or are not greasy, acidic, or spicy. Avoiding certain foods may cause less irritation to your mouth, throat, stomach, or gastrointestinal tract. Avoiding certain foods may make you feel better. Everyone's tolerances are different. A bland diet should be based on what you can tolerate and what may cause discomfort. What is my plan? Your health care provider or dietitian may recommend specific changes to your diet to treat your symptoms. These changes may include: Eating small meals frequently. Cooking food until it is soft enough to chew easily. Taking the time to chew your food thoroughly, so it is easy to swallow and digest. Avoiding foods that cause you discomfort. These may include spicy food, fried food, greasy foods, hard-to-chew foods, or citrus fruits and juices. Drinking slowly. What are tips for following this plan? Reading food labels To reduce fiber intake, look for food labels that say "whole," such as whole wheat or whole grain. Shopping Avoid food items that may have nuts or seeds. Avoid vegetables that may make you gassy or have a tough texture, such as broccoli, cauliflower, or corn. Cooking Cook foods thoroughly so they have a soft texture. Meal planning Make sure you include foods from all food groups to eat a balanced diet. Eat a variety of types of foods. Eat foods and drink beverages that do not cause you discomfort. These may include soups and broths with cooked meats, pasta, and vegetables. Lifestyle Sit up after meals, avoid tight clothing, and take time to eat and chew your food slowly. Ask your health care provider whether you should take dietary supplements. General information Mildly season your foods. Some seasonings, such as cayenne pepper,  vinegar, or hot sauce, may cause irritation. The foods, beverages, or seasonings to avoid should be based on individual tolerance. What foods should I eat? Fruits Canned or cooked fruit such as peaches, pears, or applesauce. Bananas. Vegetables Well-cooked vegetables. Canned or cooked vegetables such as carrots, green beans, beets, or spinach. Mashed or boiled potatoes. Grains  Hot cereals, such as cream of wheat and processed oatmeal. Rice. Bread, crackers, pasta, or tortillas made from refined white flour. Meats and other proteins  Eggs. Creamy peanut butter or other nut butters. Lean, well-cooked tender meats, such as beef, pork, chicken, or fish. Dairy Low-fat dairy products such as milk, cottage cheese, or yogurt. Beverages  Water. Herbal tea. Apple juice. Fats and oils Mild salad dressings. Canola or olive oil. Sweets and desserts Low-fat pudding, custard, or ice cream. Fruit gelatin. The items listed above may not be a complete list of foods and beverages you can eat. Contact a dietitian for more information. What foods should I avoid? Fruits Citrus fruits, such as oranges and grapefruit. Fruits with a stringy texture. Fruits that have lots of seeds, such as kiwi or strawberries. Dried fruits. Vegetables Raw, uncooked vegetables. Salads. Grains Whole grain breads, muffins, and cereals. Meats and other proteins Tough, fibrous meats. Highly seasoned meat such as corned beef, smoked meats, or fish. Processed high-fat meats such as brats, hot dogs, or sausage. Dairy Full-fat dairy foods such as ice cream and cheese. Beverages Caffeinated drinks. Alcohol. Seasonings and condiments Strongly flavored seasonings or condiments. Hot sauce. Salsa. Other foods Spicy foods. Fried or greasy foods. Sour foods, such  as pickled or fermented foods like sauerkraut. Foods high in fiber. The items listed above may not be a complete list of foods and beverages you should avoid. Contact a  dietitian for more information. Summary A bland diet should be based on individual tolerance. It may consist of foods that are soft textured and do not have a lot of fat, fiber, acid, or seasonings. A bland diet may be recommended because avoiding certain foods, beverages, or spices may make you feel better. This information is not intended to replace advice given to you by your health care provider. Make sure you discuss any questions you have with your health care provider. Document Revised: 01/01/2021 Document Reviewed: 01/01/2021 Elsevier Patient Education  2023 [employer].

## 2022-06-17 NOTE — Progress Notes (Signed)
Provider: Rema Lievanos FNP-C  Sharon Seller, NP  Patient Care Team: Sharon Seller, NP as PCP - General (Geriatric Medicine) Rene Paci, MD as Consulting Physician (Urology)  Extended Emergency Contact Information Primary Emergency Contact: [facility] Address: [address]          [place], [place] [address] [place] Home Phone: [phone] Mobile Phone: [phone] Relation: Son  Code Status: Full Code  Goals of care: Advanced Directive information    04/12/2022   10:34 AM  Advanced Directives  Does Patient Have a Medical Advance Directive? No  Would patient like information on creating a medical advance directive? No - Patient declined     Chief Complaint  Patient presents with   Acute Visit    Patient presents today for possible food poisoning on Saturday, 06/15/22. He reports weakness, headaches, vomiting and abdominal pain. He have not ate anything since Saturday night.     HPI:  Pt is a 77 y.o. male seen today for an acute visit for evaluation of food poisoning 3 days ago. He complains of headaches,vomiting and Abdominal pain.Has not eaten anything since Saturday night.Has been drinking water but has dry heaves.thinks had food poisoning after eating spaghetti and sauce at home. Had nausea and vomiting within one hour of eating. He denies any blood in the stool,constipation or diarrhea.   Past Medical History:  Diagnosis Date   Adenomatous colon polyp    Allergy    Anxiety    Arthritis    Cataract    removed bilateral   GERD (gastroesophageal reflux disease)    Graves disease    Per [facility] New Patient Packet   H/O hemorrhoids    H/O hiatal hernia    Heart murmur 2007   per pt history- possible murmur by [facility]- states negative stress test   Hyperlipidemia    Pneumonia    dx 03/08/21   Prostate cancer 01/20/2013   Sleep apnea    CPAP.  Trying to use it. (NOT USING CURRENTLY )   DX YEARS AGO   Thyroid  disease    graves   Ulcer approx 1970   HX peptic ulcer   Past Surgical History:  Procedure Laterality Date   CATARACT EXTRACTION W/ INTRAOCULAR LENS  IMPLANT, BILATERAL     COLONOSCOPY  2017   LYMPHADENECTOMY Bilateral 03/17/2013   Procedure: LYMPHADENECTOMY PELVIC LYMPH NODE DISSECTION;  Surgeon: Valetta Fuller, MD;  Location: [facility];  Service: Urology;  Laterality: Bilateral;   POLYPECTOMY     PROSTATE BIOPSY     ROBOT ASSISTED LAPAROSCOPIC RADICAL PROSTATECTOMY N/A 03/17/2013   Procedure: ROBOTIC ASSISTED LAPAROSCOPIC RADICAL PROSTATECTOMY;  Surgeon: Valetta Fuller, MD;  Location: [facility];  Service: Urology;  Laterality: N/A;   TONSILLECTOMY     TOTAL KNEE ARTHROPLASTY Left 10/15/2016   Procedure: TOTAL KNEE ARTHROPLASTY;  Surgeon: Sheral Apley, MD;  Location: [facility] OR;  Service: Orthopedics;  Laterality: Left;   TRANSURETHRAL RESECTION OF PROSTATE  12/01/2012   Dr Isabel Caprice   UPPER GASTROINTESTINAL ENDOSCOPY      Allergies  Allergen Reactions   Bee Venom Swelling    Throat swells    Penicillins Rash and Other (See Comments)    PATIENT HAS HAD A PCN REACTION WITH IMMEDIATE RASH, FACIAL/TONGUE/THROAT SWELLING, SOB, OR LIGHTHEADEDNESS WITH HYPOTENSION:  #  #  #  YES  #  #  #   Has patient had a PCN reaction causing severe rash involving mucus membranes  or skin necrosis: No Has patient had a PCN reaction that required hospitalization: No Has patient had a PCN reaction occurring within the last 10 years: No If all of the above answers are "NO", then may proceed with Cephalosporin use.    Hydrocodone-Acetaminophen Nausea And Vomiting    Outpatient Encounter Medications as of 06/17/2022  Medication Sig   ALPRAZolam (XANAX) 1 MG tablet TAKE 1/2 TABLET TWICE DAILY AS NEEDED.   cetirizine (ZYRTEC) 10 MG tablet Take 10 mg by mouth daily.   cholecalciferol (VITAMIN D) 1000 units tablet Take 1,000 Units by mouth daily.   diclofenac Sodium (VOLTAREN) 1 % GEL APPLY 4 GRAMS TO AFFECTED  AREA THREE TIMES A DAY AS NEEDED FOR L KNEE PAIN   EPINEPHrine 0.3 mg/0.3 mL IJ SOAJ injection Inject 0.3 mLs (0.3 mg total) into the muscle once. For allergic reaction to bee stings   esomeprazole (NEXIUM) 20 MG capsule Take 20 mg by mouth daily.   gabapentin (NEURONTIN) 100 MG capsule TAKE ONE CAPSULE BY MOUTH EVERY MORNING AND TAKE THREE CAPSULES AT BEDTIME   hydrochlorothiazide (HYDRODIURIL) 25 MG tablet Take 1 tablet by mouth daily.   mupirocin ointment (BACTROBAN) 2 % APPLY TOPICALLY TO THE AFFECTED AREA TWICE DAILY   vitamin B-12 (CYANOCOBALAMIN) 1000 MCG tablet Take 1,000 mcg by mouth daily.   vitamin E 100 UNIT capsule Take by mouth daily.   Facility-Administered Encounter Medications as of 06/17/2022  Medication   0.9 %  sodium chloride infusion    Review of Systems  Constitutional:  Negative for appetite change, chills, fatigue, fever and unexpected weight change.  HENT:  Negative for congestion, dental problem, ear discharge, ear pain, hearing loss, nosebleeds, postnasal drip, rhinorrhea, sinus pressure, sinus pain, sneezing, sore throat, tinnitus and trouble swallowing.   Eyes:  Negative for pain, discharge, redness, itching and visual disturbance.  Respiratory:  Negative for cough, chest tightness, shortness of breath and wheezing.   Cardiovascular:  Negative for chest pain, palpitations and leg swelling.  Gastrointestinal:  Positive for abdominal pain, nausea and vomiting. Negative for abdominal distention, blood in stool, constipation and diarrhea.  Genitourinary:  Negative for difficulty urinating, dysuria, flank pain, frequency and urgency.  Musculoskeletal:  Negative for arthralgias, back pain, gait problem, joint swelling, myalgias, neck pain and neck stiffness.  Skin:  Negative for color change, pallor, rash and wound.  Neurological:  Negative for dizziness, syncope, speech difficulty, weakness, light-headedness, numbness and headaches.  Hematological:  Does not  bruise/bleed easily.  Psychiatric/Behavioral:  Negative for agitation, behavioral problems, confusion, hallucinations and sleep disturbance. The patient is not nervous/anxious.     Immunization History  Administered Date(s) Administered   Influenza, High Dose Seasonal PF 11/27/2016   Influenza-Unspecified 03/08/2013, 12/03/2017   PFIZER(Purple Top)SARS-COV-2 Vaccination 04/11/2019, 05/04/2019   Pneumococcal Conjugate-13 06/01/2015   Pneumococcal Polysaccharide-23 03/18/2013   Tdap 01/03/2016   Zoster Recombinat (Shingrix) 11/04/2019, 01/07/2020   Pertinent  Health Maintenance Due  Topic Date Due   INFLUENZA VACCINE  09/26/2022   COLONOSCOPY (Pts 45-52yrs Insurance coverage will need to be confirmed)  04/04/2024      03/26/2017   10:40 AM 03/08/2021    3:25 PM 03/19/2021    1:43 PM 10/08/2021   10:24 AM 04/12/2022   10:34 AM  Fall Risk  Falls in the past year? No  0 0 0  Was there an injury with Fall?   0 0 0  Fall Risk Category Calculator   0 0 0  Fall Risk Category (Retired)  Low Low   (RETIRED) Patient Fall Risk Level  Low fall risk Low fall risk Low fall risk   Patient at Risk for Falls Due to   No Fall Risks No Fall Risks No Fall Risks  Fall risk Follow up   Falls evaluation completed Falls evaluation completed Falls evaluation completed   Functional Status Survey:    Vitals:   06/17/22 1305  BP: 110/62  Pulse: 78  Temp: 98.7 F (37.1 C)  SpO2: 96%  Weight: 183 lb (83 kg)  Height: 5' 6.5" (1.689 m)   Body mass index is 29.09 kg/m. Physical Exam Vitals reviewed.  Constitutional:      General: He is not in acute distress.    Appearance: Normal appearance. He is overweight. He is not ill-appearing or diaphoretic.  HENT:     Head: Normocephalic.     Right Ear: Tympanic membrane, ear canal and external ear normal. There is no impacted cerumen.     Left Ear: Tympanic membrane, ear canal and external ear normal. There is no impacted cerumen.     Nose: Nose normal.  No congestion or rhinorrhea.     Mouth/Throat:     Mouth: Mucous membranes are moist.     Pharynx: Oropharynx is clear. No oropharyngeal exudate or posterior oropharyngeal erythema.  Eyes:     General: No scleral icterus.       Right eye: No discharge.        Left eye: No discharge.     Extraocular Movements: Extraocular movements intact.     Conjunctiva/sclera: Conjunctivae normal.     Pupils: Pupils are equal, round, and reactive to light.  Neck:     Vascular: No carotid bruit.  Cardiovascular:     Rate and Rhythm: Normal rate and regular rhythm.     Pulses: Normal pulses.     Heart sounds: Normal heart sounds. No murmur heard.    No friction rub. No gallop.  Pulmonary:     Effort: Pulmonary effort is normal. No respiratory distress.     Breath sounds: Normal breath sounds. No wheezing, rhonchi or rales.  Chest:     Chest wall: No tenderness.  Abdominal:     General: Bowel sounds are normal. There is no distension.     Palpations: Abdomen is soft. There is no mass.     Tenderness: There is no abdominal tenderness. There is no right CVA tenderness, left CVA tenderness, guarding or rebound.  Musculoskeletal:        General: No swelling or tenderness. Normal range of motion.     Cervical back: Normal range of motion. No rigidity or tenderness.     Right lower leg: No edema.     Left lower leg: No edema.  Lymphadenopathy:     Cervical: No cervical adenopathy.  Skin:    General: Skin is warm and dry.     Coloration: Skin is not pale.     Findings: No bruising, erythema, lesion or rash.  Neurological:     Mental Status: He is alert and oriented to person, place, and time.     Cranial Nerves: No cranial nerve deficit.     Sensory: No sensory deficit.     Motor: No weakness.     Coordination: Coordination normal.     Gait: Gait normal.  Psychiatric:        Mood and Affect: Mood normal.        Speech: Speech normal.  Behavior: Behavior normal.        Thought Content:  Thought content normal.        Judgment: Judgment normal.     Labs reviewed: Recent Labs    04/12/22 1116  NA 141  K 3.6  CL 101  CO2 27  GLUCOSE 84  BUN 14  CREATININE 1.01  CALCIUM 9.6   Recent Labs    04/12/22 1116  AST 29  ALT 16  BILITOT 0.4  PROT 7.3   Recent Labs    04/12/22 1116  WBC 3.7*  NEUTROABS 2,446  HGB 14.2  HCT 40.8  MCV 96.9  PLT 199   Lab Results  Component Value Date   TSH 3.49 03/19/2021   No results found for: "HGBA1C" Lab Results  Component Value Date   CHOL 198 04/12/2022   HDL 101 04/12/2022   LDLCALC 81 04/12/2022   TRIG 82 04/12/2022   CHOLHDL 2.0 04/12/2022    Significant Diagnostic Results in last 30 days:  No results found.  Assessment/Plan 1. Insomnia Requests refills of alprazolam - ALPRAZolam (XANAX) 1 MG tablet; TAKE 1/2 TABLET TWICE DAILY AS NEEDED.  Dispense: 30 tablet; Refill: 2  2. Nausea and vomiting, unspecified vomiting type Reports possible food poisoning on Saturday -Advised to increase fluid intake and advance diet as tolerated to solid food. - Notify provider  or got to ED if symptoms worsen or fail to improve -Start on Zofran as needed - Will obtain lab work to rule out other acute and metabolic issues - ondansetron (ZOFRAN) 4 MG tablet; Take 1 tablet (4 mg total) by mouth every 8 (eight) hours as needed for nausea or vomiting.  Dispense: 20 tablet; Refill: 0 - CBC with Differential/Platelet - COMPLETE METABOLIC PANEL WITH GFR - Lipase - Amylase  Family/ staff Communication: Reviewed plan of care with patient verbalized understanding  Labs/tests ordered: - CBC with Differential/Platelet - COMPLETE METABOLIC PANEL WITH GFR - Lipase - Amylase  Next Appointment: Return if symptoms worsen or fail to improve.   Caesar Bookman, NP

## 2022-06-18 ENCOUNTER — Telehealth: Payer: Self-pay | Admitting: *Deleted

## 2022-06-18 LAB — COMPLETE METABOLIC PANEL WITH GFR
AG Ratio: 1.4 (calc) (ref 1.0–2.5)
ALT: 19 U/L (ref 9–46)
AST: 30 U/L (ref 10–35)
Albumin: 4 g/dL (ref 3.6–5.1)
Alkaline phosphatase (APISO): 57 U/L (ref 35–144)
BUN: 22 mg/dL (ref 7–25)
CO2: 31 mmol/L (ref 20–32)
Calcium: 9 mg/dL (ref 8.6–10.3)
Chloride: 93 mmol/L — ABNORMAL LOW (ref 98–110)
Creat: 1.11 mg/dL (ref 0.70–1.28)
Globulin: 2.8 g/dL (calc) (ref 1.9–3.7)
Glucose, Bld: 104 mg/dL — ABNORMAL HIGH (ref 65–99)
Potassium: 3.7 mmol/L (ref 3.5–5.3)
Sodium: 134 mmol/L — ABNORMAL LOW (ref 135–146)
Total Bilirubin: 1.1 mg/dL (ref 0.2–1.2)
Total Protein: 6.8 g/dL (ref 6.1–8.1)
eGFR: 69 mL/min/{1.73_m2} (ref >=60)

## 2022-06-18 LAB — CBC WITH DIFFERENTIAL/PLATELET
Absolute Monocytes: 308 cells/uL (ref 200–950)
Basophils Absolute: 34 cells/uL (ref 0–200)
Basophils Relative: 0.3 %
Eosinophils Absolute: 11 cells/uL — ABNORMAL LOW (ref 15–500)
Eosinophils Relative: 0.1 %
HCT: 37.3 % — ABNORMAL LOW (ref 38.5–50.0)
Hemoglobin: 13.4 g/dL (ref 13.2–17.1)
Lymphs Abs: 274 cells/uL — ABNORMAL LOW (ref 850–3900)
MCH: 34.4 pg — ABNORMAL HIGH (ref 27.0–33.0)
MCHC: 35.9 g/dL (ref 32.0–36.0)
MCV: 95.9 fL (ref 80.0–100.0)
MPV: 10.5 fL (ref 7.5–12.5)
Monocytes Relative: 2.7 %
Neutro Abs: 10773 cells/uL — ABNORMAL HIGH (ref 1500–7800)
Neutrophils Relative %: 94.5 %
Platelets: 142 10*3/uL (ref 140–400)
RBC: 3.89 10*6/uL — ABNORMAL LOW (ref 4.20–5.80)
RDW: 12.5 % (ref 11.0–15.0)
Total Lymphocyte: 2.4 %
WBC: 11.4 10*3/uL — ABNORMAL HIGH (ref 3.8–10.8)

## 2022-06-18 LAB — AMYLASE: Amylase: 26 U/L (ref 21–101)

## 2022-06-18 LAB — LIPASE: Lipase: 14 U/L (ref 7–60)

## 2022-06-18 MED ORDER — CIPROFLOXACIN HCL 500 MG PO TABS: 500.0000 mg | ORAL_TABLET | Freq: Two times a day (BID) | ORAL | 0 refills | Status: DC

## 2022-06-18 MED ORDER — SACCHAROMYCES BOULARDII 250 MG PO CAPS: 250.0000 mg | ORAL_CAPSULE | Freq: Two times a day (BID) | ORAL | 0 refills | Status: DC

## 2022-06-18 NOTE — Telephone Encounter (Signed)
-----   Message from Caesar Bookman, NP sent at 06/18/2022  9:09 AM EDT ----- White blood cells are high possible due to gastritis from food poisoning. hemoglobin,glucose,electrolytes kidney and liver function are all within normal range.  Start on Cipro 500 mg tablet one by mouth twice daily x 7 days Take along with probiotics Florastor 250 mg capsule one by mouth twice daily x 10 days to prevent antibiotics associated diarrhea

## 2022-06-18 NOTE — Telephone Encounter (Signed)
Patient Aware of Results (see lab report) per Michaell Cowing, CMA Medications added and sent to pharmacy as patient requested.

## 2022-08-15 ENCOUNTER — Encounter: Payer: Medicare HMO | Admitting: Adult Health

## 2022-08-16 ENCOUNTER — Encounter: Payer: Medicare HMO | Admitting: Adult Health

## 2022-08-23 NOTE — Progress Notes (Signed)
This encounter was created in error - please disregard.

## 2022-10-01 ENCOUNTER — Encounter: Payer: Self-pay | Admitting: Orthopedic Surgery

## 2022-10-01 ENCOUNTER — Ambulatory Visit (INDEPENDENT_AMBULATORY_CARE_PROVIDER_SITE_OTHER): Payer: Medicare HMO | Admitting: Orthopedic Surgery

## 2022-10-01 VITALS — BP 110/64 | HR 88 | Temp 98.9°F | Resp 16 | Ht 66.5 in | Wt 171.4 lb

## 2022-10-01 DIAGNOSIS — R051 Acute cough: Secondary | ICD-10-CM

## 2022-10-01 DIAGNOSIS — Z720 Tobacco use: Secondary | ICD-10-CM | POA: Diagnosis not present

## 2022-10-01 DIAGNOSIS — R0602 Shortness of breath: Secondary | ICD-10-CM

## 2022-10-01 DIAGNOSIS — R0981 Nasal congestion: Secondary | ICD-10-CM

## 2022-10-01 LAB — POCT INFLUENZA A/B
Influenza A, POC: NEGATIVE
Influenza B, POC: NEGATIVE

## 2022-10-01 LAB — POC COVID19 BINAXNOW: SARS Coronavirus 2 Ag: NEGATIVE

## 2022-10-01 MED ORDER — ALBUTEROL SULFATE HFA 108 (90 BASE) MCG/ACT IN AERS: 2.0000 | INHALATION_SPRAY | Freq: Four times a day (QID) | RESPIRATORY_TRACT | 0 refills | Status: DC | PRN

## 2022-10-01 MED ORDER — AZITHROMYCIN 250 MG PO TABS
ORAL_TABLET | ORAL | 0 refills | Status: AC
Start: 2022-10-01 — End: 2022-10-06

## 2022-10-01 MED ORDER — PREDNISONE 20 MG PO TABS
ORAL_TABLET | ORAL | 0 refills | Status: AC
Start: 2022-10-01 — End: 2022-10-08

## 2022-10-01 NOTE — Progress Notes (Signed)
Careteam: Patient Care Team: Sharon Seller, NP as PCP - General (Geriatric Medicine) Rene Paci, MD as Consulting Physician (Urology)  Seen by: Hazle Nordmann, AGNP-C  PLACE OF SERVICE:  [facility]  Advanced Directive information Does Patient Have a Medical Advance Directive?: No, Would patient like information on creating a medical advance directive?: No - Patient declined  Allergies  Allergen Reactions   Bee Venom Swelling    Throat swells    Penicillins Rash and Other (See Comments)    PATIENT HAS HAD A PCN REACTION WITH IMMEDIATE RASH, FACIAL/TONGUE/THROAT SWELLING, SOB, OR LIGHTHEADEDNESS WITH HYPOTENSION:  #  #  #  YES  #  #  #   Has patient had a PCN reaction causing severe rash involving mucus membranes or skin necrosis: No Has patient had a PCN reaction that required hospitalization: No Has patient had a PCN reaction occurring within the last 10 years: No If all of the above answers are "NO", then may proceed with Cephalosporin use.    Hydrocodone-Acetaminophen Nausea And Vomiting    Chief Complaint  Patient presents with   Acute Visit    Patient complains of cough, and SOB.      HPI: Patient is a 77 y.o. male seen today for acute visit for cough and shortness of breath.   08/01 he had increased chills in his hotel room. He was able to sleep that night. He did not have any other symptoms. The next day he woke up with productive cough, nasal congestion, body aches and increased shortness of breath. Denies chest pain, fever, and sore throat. H/o > 20 years smoking. Since becoming ill, he has been smoking about 6 cigarettes daily. Cough is keeping him up at night. Cough increased with deep breaths or talking. Sputum is clear. Rapid covid and flu negative today. He [facility]. No plans to quit smoking at this time. Afebrile. Vitals stable.    Review of Systems:  Review of Systems  Constitutional:  Positive  for chills and malaise/fatigue. Negative for fever.  HENT:  Positive for congestion. Negative for sinus pain and sore throat.   Respiratory:  Positive for cough, sputum production, shortness of breath and wheezing.   Cardiovascular:  Negative for chest pain and leg swelling.  Gastrointestinal:  Negative for nausea and vomiting.  Musculoskeletal:  Negative for myalgias.  Neurological:  Negative for dizziness and headaches.  Endo/Heme/Allergies:  Positive for environmental allergies.  Psychiatric/Behavioral:  Negative for depression. The patient is not nervous/anxious.     Past Medical History:  Diagnosis Date   Adenomatous colon polyp    Allergy    Anxiety    Arthritis    Cataract    removed bilateral   GERD (gastroesophageal reflux disease)    Graves disease    Per [facility] New Patient Packet   H/O hemorrhoids    H/O hiatal hernia    Heart murmur 2007   per pt history- possible murmur by [facility]- states negative stress test   Hyperlipidemia    Pneumonia    dx 03/08/21   Prostate cancer (HCC) 01/20/2013   Sleep apnea    CPAP.  Trying to use it. (NOT USING CURRENTLY )   DX YEARS AGO   Thyroid disease    graves   Ulcer approx 1970   HX peptic ulcer   Past Surgical History:  Procedure Laterality Date   CATARACT EXTRACTION W/ INTRAOCULAR LENS  IMPLANT, BILATERAL  COLONOSCOPY  2017   LYMPHADENECTOMY Bilateral 03/17/2013   Procedure: LYMPHADENECTOMY PELVIC LYMPH NODE DISSECTION;  Surgeon: Valetta Fuller, MD;  Location: [facility];  Service: Urology;  Laterality: Bilateral;   POLYPECTOMY     PROSTATE BIOPSY     ROBOT ASSISTED LAPAROSCOPIC RADICAL PROSTATECTOMY N/A 03/17/2013   Procedure: ROBOTIC ASSISTED LAPAROSCOPIC RADICAL PROSTATECTOMY;  Surgeon: Valetta Fuller, MD;  Location: [facility];  Service: Urology;  Laterality: N/A;   TONSILLECTOMY     TOTAL KNEE ARTHROPLASTY Left 10/15/2016   Procedure: TOTAL KNEE ARTHROPLASTY;  Surgeon: Sheral Apley, MD;  Location: [facility];  Service:  Orthopedics;  Laterality: Left;   TRANSURETHRAL RESECTION OF PROSTATE  12/01/2012   Dr Isabel Caprice   UPPER GASTROINTESTINAL ENDOSCOPY     Social History:   reports that he has been smoking cigarettes. He has never used smokeless tobacco. He reports current alcohol use. He reports that he does not use drugs.  Family History  Problem Relation Age of Onset   Cancer Mother        brain   Cancer Brother        Question lung   Drug abuse Maternal Aunt    Colon polyps Neg Hx    Colon cancer Neg Hx    Rectal cancer Neg Hx    Stomach cancer Neg Hx    Esophageal cancer Neg Hx     Medications: Patient's Medications  New Prescriptions   No medications on file  Previous Medications   ALPRAZOLAM (XANAX) 1 MG TABLET    TAKE 1/2 TABLET TWICE DAILY AS NEEDED.   CETIRIZINE (ZYRTEC) 10 MG TABLET    Take 10 mg by mouth daily.   CHOLECALCIFEROL (VITAMIN D) 1000 UNITS TABLET    Take 1,000 Units by mouth daily.   DICLOFENAC SODIUM (VOLTAREN) 1 % GEL    APPLY 4 GRAMS TO AFFECTED AREA THREE TIMES A DAY AS NEEDED FOR L KNEE PAIN   EPINEPHRINE 0.3 MG/0.3 ML IJ SOAJ INJECTION    Inject 0.3 mLs (0.3 mg total) into the muscle once. For allergic reaction to bee stings   ESOMEPRAZOLE (NEXIUM) 20 MG CAPSULE    Take 20 mg by mouth daily.   GABAPENTIN (NEURONTIN) 100 MG CAPSULE    TAKE ONE CAPSULE BY MOUTH EVERY MORNING AND TAKE THREE CAPSULES AT BEDTIME   HYDROCHLOROTHIAZIDE (HYDRODIURIL) 25 MG TABLET    Take 1 tablet by mouth daily.   MUPIROCIN OINTMENT (BACTROBAN) 2 %    APPLY TOPICALLY TO THE AFFECTED AREA TWICE DAILY   VITAMIN B-12 (CYANOCOBALAMIN) 1000 MCG TABLET    Take 1,000 mcg by mouth daily.   VITAMIN E 100 UNIT CAPSULE    Take by mouth daily.  Modified Medications   No medications on file  Discontinued Medications   CIPROFLOXACIN (CIPRO) 500 MG TABLET    Take 1 tablet (500 mg total) by mouth 2 (two) times daily. For 7 days   ONDANSETRON (ZOFRAN) 4 MG TABLET    Take 1 tablet (4 mg total) by mouth every  8 (eight) hours as needed for nausea or vomiting.   SACCHAROMYCES BOULARDII (FLORASTOR) 250 MG CAPSULE    Take 1 capsule (250 mg total) by mouth 2 (two) times daily.    Physical Exam:  Vitals:   10/01/22 1403  BP: 110/64  Pulse: 88  Resp: 16  Temp: 98.9 F (37.2 C)  SpO2: 93%  Weight: 171 lb 6.4 oz (77.7 kg)  Height: 5' 6.5" (1.689 m)   Body  mass index is 27.25 kg/m. Wt Readings from Last 3 Encounters:  10/01/22 171 lb 6.4 oz (77.7 kg)  06/17/22 183 lb (83 kg)  04/12/22 179 lb (81.2 kg)    Physical Exam Vitals reviewed.  Constitutional:      General: He is not in acute distress. HENT:     Head: Normocephalic.     Nose: Rhinorrhea present.     Mouth/Throat:     Mouth: Mucous membranes are moist.     Pharynx: No posterior oropharyngeal erythema.  Eyes:     General:        Right eye: No discharge.        Left eye: No discharge.  Cardiovascular:     Rate and Rhythm: Normal rate and regular rhythm.     Pulses: Normal pulses.     Heart sounds: Normal heart sounds.  Pulmonary:     Effort: Pulmonary effort is normal. No respiratory distress.     Breath sounds: Examination of the right-upper field reveals wheezing and rhonchi. Examination of the left-upper field reveals wheezing and rhonchi. Examination of the right-middle field reveals wheezing. Examination of the left-middle field reveals wheezing. Wheezing and rhonchi present. No rales.     Comments: expiratory Abdominal:     General: Bowel sounds are normal.     Palpations: Abdomen is soft.  Musculoskeletal:     Cervical back: Neck supple.     Right lower leg: No edema.     Left lower leg: No edema.  Skin:    General: Skin is warm.     Capillary Refill: Capillary refill takes less than 2 seconds.  Neurological:     General: No focal deficit present.     Mental Status: He is alert and oriented to person, place, and time.  Psychiatric:        Mood and Affect: Mood normal.     Labs reviewed: Basic Metabolic  Panel: Recent Labs    04/12/22 1116 06/17/22 1346  NA 141 134*  K 3.6 3.7  CL 101 93*  CO2 27 31  GLUCOSE 84 104*  BUN 14 22  CREATININE 1.01 1.11  CALCIUM 9.6 9.0   Liver Function Tests: Recent Labs    04/12/22 1116 06/17/22 1346  AST 29 30  ALT 16 19  BILITOT 0.4 1.1  PROT 7.3 6.8   Recent Labs    06/17/22 1346  LIPASE 14  AMYLASE 26   No results for input(s): "AMMONIA" in the last 8760 hours. CBC: Recent Labs    04/12/22 1116 06/17/22 1346  WBC 3.7* 11.4*  NEUTROABS 2,446 10,773*  HGB 14.2 13.4  HCT 40.8 37.3*  MCV 96.9 95.9  PLT 199 142   Lipid Panel: Recent Labs    04/12/22 1116  CHOL 198  HDL 101  LDLCALC 81  TRIG 82  CHOLHDL 2.0   TSH: No results for input(s): "TSH" in the last 8760 hours. A1C: No results found for: "HGBA1C"   Assessment/Plan: 1. Acute cough - suspect chronic cough, bronchitis, COPD exacerbation - expiratory wheezing and rhonchi on exam - will start prednisone and zpack due to symptoms - POCT Influenza A/B> negative - POC COVID-19> negative - recommend smoking cessation - predniSONE (DELTASONE) 20 MG tablet; Take 2 tablets (40 mg total) by mouth daily with breakfast for 2 days, THEN 1 tablet (20 mg total) daily with breakfast for 5 days.  Dispense: 9 tablet; Refill: 0 - azithromycin (ZITHROMAX) 250 MG tablet; Take 2 tablets on day 1,  then 1 tablet daily on days 2 through 5  Dispense: 6 tablet; Refill: 0  2. Nasal congestion - see above - cont Zyrtec - POCT Influenza A/B>negative - POC COVID-19> negative  3. SOB (shortness of breath) - see above - sats> 93% on RA - breathing unlabored - POCT Influenza A/B> negative - POC COVID-19> negative - albuterol (VENTOLIN HFA) 108 (90 Base) MCG/ACT inhaler; Inhale 2 puffs into the lungs every 6 (six) hours as needed for wheezing or shortness of breath.  Dispense: 8 g; Refill: 0  4. Tobacco abuse - no plans to quit at this time - [facility] -  recommend Prevnar 20 when well   Total time: 23. Greater than 50% of total time spent doing patient education regarding cough, shortness of breath, smoking cessation including symptom/medication management.    Next appt: Visit date not found   Scherry Ran  [facility] [phone]

## 2022-10-01 NOTE — Patient Instructions (Addendum)
You may use naproxen for body aches  Please take prednisone in the morning  Stop smoking  Consider Prevnar 20 vaccine next visit

## 2022-10-07 ENCOUNTER — Telehealth: Payer: Medicare HMO | Admitting: Nurse Practitioner

## 2022-10-07 ENCOUNTER — Encounter: Payer: Self-pay | Admitting: Nurse Practitioner

## 2022-10-07 DIAGNOSIS — Z0000 Encounter for general adult medical examination without abnormal findings: Secondary | ICD-10-CM | POA: Diagnosis not present

## 2022-10-07 NOTE — Progress Notes (Signed)
   This service is provided via telemedicine  No vital signs collected/recorded due to the encounter was a telemedicine visit.   Location of patient (ex: home, work):  Home  Patient consents to a telephone visit: Yes  Location of the provider (ex: office, home):  [facility], Office   Name of any referring provider:  N/A  Names of all persons participating in the telemedicine service and their role in the encounter:  S.Chrae B/CMA, Abbey Chatters, NP, and Patient   Time spent on call:  7 min with medical assistant

## 2022-10-07 NOTE — Progress Notes (Signed)
Subjective:   Hunter Porter is a 77 y.o. male who presents for Medicare Annual/Subsequent preventive examination.  Visit Complete: Virtual  I connected with  Hunter Porter on 10/07/22 by a video and audio enabled telemedicine application and verified that I am speaking with the correct person using two identifiers.  Patient Location: Home  Provider Location: Office/Clinic  I discussed the limitations of evaluation and management by telemedicine. The patient expressed understanding and agreed to proceed.    Review of Systems     Cardiac Risk Factors include: advanced age (>23men, >69 women);male gender;dyslipidemia     Objective:    There were no vitals filed for this visit. There is no height or weight on file to calculate BMI.     10/07/2022    2:17 PM 10/01/2022    2:12 PM 04/12/2022   10:34 AM 03/19/2021    1:43 PM 10/03/2016   11:44 AM 11/22/2015    8:40 AM 11/08/2015   10:03 AM  Advanced Directives  Does Patient Have a Medical Advance Directive? No No No No No No No  Would patient like information on creating a medical advance directive? No - Patient declined No - Patient declined No - Patient declined No - Patient declined No - Patient declined      Current Medications (verified) Outpatient Encounter Medications as of 10/07/2022  Medication Sig   albuterol (VENTOLIN HFA) 108 (90 Base) MCG/ACT inhaler Inhale 2 puffs into the lungs every 6 (six) hours as needed for wheezing or shortness of breath.   ALPRAZolam (XANAX) 1 MG tablet TAKE 1/2 TABLET TWICE DAILY AS NEEDED.   cetirizine (ZYRTEC) 10 MG tablet Take 10 mg by mouth daily.   cholecalciferol (VITAMIN D) 1000 units tablet Take 1,000 Units by mouth daily.   diclofenac Sodium (VOLTAREN) 1 % GEL APPLY 4 GRAMS TO AFFECTED AREA THREE TIMES A DAY AS NEEDED FOR L KNEE PAIN   EPINEPHrine 0.3 mg/0.3 mL IJ SOAJ injection Inject 0.3 mLs (0.3 mg total) into the muscle once. For allergic reaction to bee stings   esomeprazole  (NEXIUM) 20 MG capsule Take 20 mg by mouth daily.   gabapentin (NEURONTIN) 100 MG capsule TAKE ONE CAPSULE BY MOUTH EVERY MORNING AND TAKE THREE CAPSULES AT BEDTIME   hydrochlorothiazide (HYDRODIURIL) 25 MG tablet Take 1 tablet by mouth daily.   mupirocin ointment (BACTROBAN) 2 % APPLY TOPICALLY TO THE AFFECTED AREA TWICE DAILY   predniSONE (DELTASONE) 20 MG tablet Take 2 tablets (40 mg total) by mouth daily with breakfast for 2 days, THEN 1 tablet (20 mg total) daily with breakfast for 5 days.   vitamin B-12 (CYANOCOBALAMIN) 1000 MCG tablet Take 1,000 mcg by mouth daily.   vitamin E 100 UNIT capsule Take by mouth daily.   Facility-Administered Encounter Medications as of 10/07/2022  Medication   0.9 %  sodium chloride infusion    Allergies (verified) Bee venom, Penicillins, and Hydrocodone-acetaminophen   History: Past Medical History:  Diagnosis Date   Adenomatous colon polyp    Allergy    Anxiety    Arthritis    Cataract    removed bilateral   GERD (gastroesophageal reflux disease)    Graves disease    Per [facility] New Patient Packet   H/O hemorrhoids    H/O hiatal hernia    Heart murmur 2007   per pt history- possible murmur by [facility]- states negative stress test   Hyperlipidemia    Pneumonia    dx 03/08/21  Prostate cancer (HCC) 01/20/2013   Sleep apnea    CPAP.  Trying to use it. (NOT USING CURRENTLY )   DX YEARS AGO   Thyroid disease    graves   Ulcer approx 1970   HX peptic ulcer   Past Surgical History:  Procedure Laterality Date   CATARACT EXTRACTION W/ INTRAOCULAR LENS  IMPLANT, BILATERAL     COLONOSCOPY  2017   LYMPHADENECTOMY Bilateral 03/17/2013   Procedure: LYMPHADENECTOMY PELVIC LYMPH NODE DISSECTION;  Surgeon: Valetta Fuller, MD;  Location: [facility];  Service: Urology;  Laterality: Bilateral;   POLYPECTOMY     PROSTATE BIOPSY     ROBOT ASSISTED LAPAROSCOPIC RADICAL PROSTATECTOMY N/A 03/17/2013   Procedure: ROBOTIC ASSISTED LAPAROSCOPIC RADICAL PROSTATECTOMY;   Surgeon: Valetta Fuller, MD;  Location: [facility];  Service: Urology;  Laterality: N/A;   TONSILLECTOMY     TOTAL KNEE ARTHROPLASTY Left 10/15/2016   Procedure: TOTAL KNEE ARTHROPLASTY;  Surgeon: Sheral Apley, MD;  Location: [facility] OR;  Service: Orthopedics;  Laterality: Left;   TRANSURETHRAL RESECTION OF PROSTATE  12/01/2012   Dr Isabel Caprice   UPPER GASTROINTESTINAL ENDOSCOPY     Family History  Problem Relation Age of Onset   Cancer Mother        brain   Cancer Brother        Question lung   Drug abuse Maternal Aunt    Colon polyps Neg Hx    Colon cancer Neg Hx    Rectal cancer Neg Hx    Stomach cancer Neg Hx    Esophageal cancer Neg Hx    Social History   Socioeconomic History   Marital status: Single    Spouse name: Not on file   Number of children: Not on file   Years of education: Not on file   Highest education level: Not on file  Occupational History   Not on file  Tobacco Use   Smoking status: Every Day    Current packs/day: 0.50    Types: Cigarettes   Smokeless tobacco: Never  Vaping Use   Vaping status: Never Used  Substance and Sexual Activity   Alcohol use: Yes    Comment: occassion   Drug use: No   Sexual activity: Not Currently  Other Topics Concern   Not on file  Social History Narrative   Retired. Plays golf 4 days/week.Works in yard. Drinks 6 beers each evening.      Diet: Left blank      Caffeine: yes      Married, if yes what year: Single      Do you live in a house, apartment, assisted living, condo, trailer, ect: House      Is it one or more stories: 2 stories      How many persons live in your home? 2 people      Pets: Yes, dog      Highest level or education completed: 2 years of college      Current/Past profession: [employer]      Exercise:   Yes               Type and how often: Golf and weights         Living Will: No   DNR: No   POA/HPOA: No      Functional Status:   Do you have difficulty bathing or dressing yourself? No    Do you have difficulty preparing food or eating? No   Do you have difficulty  managing your medications? No   Do you have difficulty managing your finances? No   Do you have difficulty affording your medications? No   Social Determinants of Corporate investment banker Strain: Not on file  Food Insecurity: Not on file  Transportation Needs: Not on file  Physical Activity: Not on file  Stress: Not on file  Social Connections: Not on file    Tobacco Counseling Ready to quit: Not Answered Counseling given: Not Answered   Clinical Intake:  Pre-visit preparation completed: Yes  Pain : No/denies pain     BMI - recorded: 27 Nutritional Risks: None Diabetes: No  How often do you need to have someone help you when you read instructions, pamphlets, or other written materials from your doctor or pharmacy?: 2 - Rarely         Activities of Daily Living    10/07/2022    2:31 PM  In your present state of health, do you have any difficulty performing the following activities:  Hearing? 1  Vision? 0  Difficulty concentrating or making decisions? 0  Walking or climbing stairs? 1  Dressing or bathing? 0  Doing errands, shopping? 0  Preparing Food and eating ? N  Using the Toilet? N  In the past six months, have you accidently leaked urine? Y  Do you have problems with loss of bowel control? N  Managing your Medications? N  Managing your Finances? N  Housekeeping or managing your Housekeeping? N    Patient Care Team: Sharon Seller, NP as PCP - General (Geriatric Medicine) Rene Paci, MD as Consulting Physician (Urology)  Indicate any recent Medical Services you may have received from other than [facility] providers in the past year (date may be approximate).     Assessment:   This is a routine wellness examination for Teresa.  Hearing/Vision screen Hearing Screening - Comments:: Wears hearing aids  Vision Screening - Comments:: Last eye exam less than 12  months ago with doctor at the [facility]   Dietary issues and exercise activities discussed:     Goals Addressed   None    Depression Screen    10/07/2022    2:16 PM 04/12/2022   10:34 AM 10/08/2021   10:24 AM 03/19/2021    1:43 PM 03/26/2017   10:40 AM 11/27/2016   11:34 AM 05/29/2016   11:49 AM  PHQ 2/9 Scores  PHQ - 2 Score  0 0 0 0 0 0  PHQ- 9 Score       0  Exception Documentation Other- indicate reason in comment box          Fall Risk    10/07/2022    2:16 PM 10/01/2022    2:11 PM 04/12/2022   10:34 AM 10/08/2021   10:24 AM 03/19/2021    1:43 PM  Fall Risk   Falls in the past year? 0 0 0 0 0  Number falls in past yr: 0 0 0 0 0  Injury with Fall? 0 0 0 0 0  Risk for fall due to : No Fall Risks No Fall Risks No Fall Risks No Fall Risks No Fall Risks  Follow up Falls evaluation completed Falls evaluation completed;Education provided;Falls prevention discussed Falls evaluation completed Falls evaluation completed Falls evaluation completed    MEDICARE RISK AT HOME:   TIMED UP AND GO:  Was the test performed?  No    Cognitive Function:        10/07/2022    2:18  PM  6CIT Screen  What Year? 0 points  What month? 0 points  What time? 0 points  Count back from 20 0 points  Months in reverse 0 points  Repeat phrase 0 points  Total Score 0 points    Immunizations Immunization History  Administered Date(s) Administered   Influenza Split 01/24/2009   Influenza, High Dose Seasonal PF 01/03/2016, 11/27/2016   Influenza-Unspecified 03/08/2013, 12/03/2017, 11/26/2018   Moderna Sars-Covid-2 Vaccination 09/06/2020   PFIZER(Purple Top)SARS-COV-2 Vaccination 04/11/2019, 05/04/2019   Pneumococcal Conjugate-13 06/01/2015, 12/03/2017   Pneumococcal Polysaccharide-23 03/18/2013, 10/06/2019   Tdap 01/03/2016   Zoster Recombinant(Shingrix) 11/04/2019, 01/07/2020    TDAP status: Up to date  Flu Vaccine status: Due, Education has been provided regarding the importance of this  vaccine. Advised may receive this vaccine at local pharmacy or Health Dept. Aware to provide a copy of the vaccination record if obtained from local pharmacy or Health Dept. Verbalized acceptance and understanding.  Pneumococcal vaccine status: Up to date  Covid-19 vaccine status: Information provided on how to obtain vaccines.   Qualifies for Shingles Vaccine? Yes   Zostavax completed No   Shingrix Completed?: Yes  Screening Tests Health Maintenance  Topic Date Due   INFLUENZA VACCINE  09/26/2022   Medicare Annual Wellness (AWV)  10/07/2023   Colonoscopy  04/04/2024   DTaP/Tdap/Td (2 - Td or Tdap) 01/02/2026   Pneumonia Vaccine 71+ Years old  Completed   Hepatitis C Screening  Completed   Zoster Vaccines- Shingrix  Completed   HPV VACCINES  Aged Out   COVID-19 Vaccine  Discontinued    Health Maintenance  Health Maintenance Due  Topic Date Due   INFLUENZA VACCINE  09/26/2022    Colorectal cancer screening: No longer required.   Lung Cancer Screening: (Low Dose CT Chest recommended if Age 61-80 years, 20 pack-year currently smoking OR have quit w/in 15years.) does qualify.   Lung Cancer Screening Referral:   Additional Screening:  Hepatitis C Screening: does qualify; Completed  Vision Screening: Recommended annual ophthalmology exams for early detection of glaucoma and other disorders of the eye. Is the patient up to date with their annual eye exam?  Yes  Who is the provider or what is the name of the office in which the patient [facility]   If pt is not established with a provider, would they like to be referred to a provider to establish care? No .   Dental Screening: Recommended annual dental exams for proper oral hygiene   Community Resource Referral / Chronic Care Management: CRR required this visit?  No   CCM required this visit?  No     Plan:     I have personally reviewed and noted the following in the patient's chart:   Medical  and social history Use of alcohol, tobacco or illicit drugs  Current medications and supplements including opioid prescriptions. Patient is not currently taking opioid prescriptions. Functional ability and status Nutritional status Physical activity Advanced directives List of other physicians Hospitalizations, surgeries, and ER visits in previous 12 months Vitals Screenings to include cognitive, depression, and falls Referrals and appointments  In addition, I have reviewed and discussed with patient certain preventive protocols, quality metrics, and best practice recommendations. A written personalized care plan for preventive services as well as general preventive health recommendations were provided to patient.     Sharon Seller, NP   10/07/2022   After Visit Summary: (MyChart) Due to this being a telephonic visit, the  after visit summary with patients personalized plan was offered to patient via MyChart

## 2022-10-07 NOTE — Patient Instructions (Signed)
  Mr. Bredow , Thank you for taking time to come for your Medicare Wellness Visit. I appreciate your ongoing commitment to your health goals. Please review the following plan we discussed and let me know if I can assist you in the future.   This is a list of the screening recommended for you and due dates:  Health Maintenance  Topic Date Due   Flu Shot  09/26/2022   Medicare Annual Wellness Visit  10/07/2023   Colon Cancer Screening  04/04/2024   DTaP/Tdap/Td vaccine (2 - Td or Tdap) 01/02/2026   Pneumonia Vaccine  Completed   Hepatitis C Screening  Completed   Zoster (Shingles) Vaccine  Completed   HPV Vaccine  Aged Out   COVID-19 Vaccine  Discontinued

## 2022-11-15 ENCOUNTER — Encounter: Payer: Self-pay | Admitting: Nurse Practitioner

## 2022-11-18 ENCOUNTER — Encounter: Payer: Self-pay | Admitting: Nurse Practitioner

## 2022-11-18 ENCOUNTER — Ambulatory Visit (INDEPENDENT_AMBULATORY_CARE_PROVIDER_SITE_OTHER): Payer: Medicare HMO | Admitting: Nurse Practitioner

## 2022-11-18 VITALS — BP 118/70 | HR 73 | Temp 97.9°F | Ht 66.5 in | Wt 165.0 lb

## 2022-11-18 DIAGNOSIS — F5101 Primary insomnia: Secondary | ICD-10-CM

## 2022-11-18 DIAGNOSIS — E785 Hyperlipidemia, unspecified: Secondary | ICD-10-CM | POA: Diagnosis not present

## 2022-11-18 DIAGNOSIS — Z720 Tobacco use: Secondary | ICD-10-CM | POA: Diagnosis not present

## 2022-11-18 DIAGNOSIS — F1010 Alcohol abuse, uncomplicated: Secondary | ICD-10-CM | POA: Diagnosis not present

## 2022-11-18 DIAGNOSIS — E538 Deficiency of other specified B group vitamins: Secondary | ICD-10-CM | POA: Diagnosis not present

## 2022-11-18 DIAGNOSIS — I10 Essential (primary) hypertension: Secondary | ICD-10-CM | POA: Diagnosis not present

## 2022-11-18 DIAGNOSIS — R0989 Other specified symptoms and signs involving the circulatory and respiratory systems: Secondary | ICD-10-CM | POA: Diagnosis not present

## 2022-11-18 DIAGNOSIS — M1712 Unilateral primary osteoarthritis, left knee: Secondary | ICD-10-CM

## 2022-11-18 DIAGNOSIS — F419 Anxiety disorder, unspecified: Secondary | ICD-10-CM

## 2022-11-18 MED ORDER — PREDNISONE 10 MG (21) PO TBPK
ORAL_TABLET | ORAL | 0 refills | Status: DC
Start: 2022-11-18 — End: 2023-05-23

## 2022-11-18 MED ORDER — ALBUTEROL SULFATE HFA 108 (90 BASE) MCG/ACT IN AERS
2.0000 | INHALATION_SPRAY | Freq: Four times a day (QID) | RESPIRATORY_TRACT | 0 refills | Status: AC | PRN
Start: 2022-11-18 — End: ?

## 2022-11-18 NOTE — Progress Notes (Signed)
Careteam: Patient Care Team: Sharon Seller, NP as PCP - General (Geriatric Medicine) Rene Paci, MD as Consulting Physician (Urology)  PLACE OF SERVICE:  [facility] CLIN[facility]  Advanced Directive information Does Patient Have a Medical Advance Directive?: No, Would patient like information on creating a medical advance directive?: No - Patient declined  Allergies  Allergen Reactions   Bee Venom Swelling    Throat swells    Penicillins Rash and Other (See Comments)    PATIENT HAS HAD A PCN REACTION WITH IMMEDIATE RASH, FACIAL/TONGUE/THROAT SWELLING, SOB, OR LIGHTHEADEDNESS WITH HYPOTENSION:  #  #  #  YES  #  #  #   Has patient had a PCN reaction causing severe rash involving mucus membranes or skin necrosis: No Has patient had a PCN reaction that required hospitalization: No Has patient had a PCN reaction occurring within the last 10 years: No If all of the above answers are "NO", then may proceed with Cephalosporin use.    Hydrocodone-Acetaminophen Nausea And Vomiting    Chief Complaint  Patient presents with   Follow-up    Routine follow-up. Discuss need for flu vaccine (patient c/o ongoing congestion related to bronchitis)      HPI: Patient is a 77 y.o. male presents for chronic disease follow-up. Is still having a lot of chest congestion, feels like it hasn't cleared up after he had bronchitis. Occasional/intermittent shortness of breath, specially first thing in the morning. States he is still smoking (1/2 pack per day), but he is staying hydrated.   Followed by psychiatrist regularly at the [place]. Is drinking 3-4 alcoholic cocktails or wine with dinner. States he has tried 3 different medications to help him cut back but they didn't help and they just made him feel bad.  Takes naproxen 375mg  PRN, no more than 2 per week for left knee pain. Did massage therapy and doing acupuncture which is helping.   No issues with blood in the urine anymore.   Review of  Systems:  Review of Systems  Constitutional:  Negative for chills, fever and malaise/fatigue.  HENT:  Positive for congestion (chest).   Respiratory:  Positive for cough and shortness of breath.   Cardiovascular:  Negative for chest pain and palpitations.  Gastrointestinal:  Negative for blood in stool, constipation, diarrhea, nausea and vomiting.  Genitourinary:  Negative for dysuria, frequency and urgency.  Musculoskeletal:  Positive for joint pain (left knee). Negative for falls.  Neurological:  Negative for dizziness, weakness and headaches.  Psychiatric/Behavioral:  Positive for substance abuse. Negative for depression. The patient has insomnia (some nights). The patient is not nervous/anxious.     Past Medical History:  Diagnosis Date   Adenomatous colon polyp    Allergy    Anxiety    Arthritis    Cataract    removed bilateral   GERD (gastroesophageal reflux disease)    Graves disease    Per [facility] New Patient Packet   H/O hemorrhoids    H/O hiatal hernia    Heart murmur 2007   per pt history- possible murmur by PCp- states negative stress test   Hyperlipidemia    Pneumonia    dx 03/08/21   Prostate cancer (HCC) 01/20/2013   Sleep apnea    CPAP.  Trying to use it. (NOT USING CURRENTLY )   DX YEARS AGO   Thyroid disease    graves   Ulcer approx 1970   HX peptic ulcer   Past Surgical History:  Procedure Laterality  Date   CATARACT EXTRACTION W/ INTRAOCULAR LENS  IMPLANT, BILATERAL     COLONOSCOPY  2017   LYMPHADENECTOMY Bilateral 03/17/2013   Procedure: LYMPHADENECTOMY PELVIC LYMPH NODE DISSECTION;  Surgeon: Valetta Fuller, MD;  Location: [facility];  Service: Urology;  Laterality: Bilateral;   POLYPECTOMY     PROSTATE BIOPSY     ROBOT ASSISTED LAPAROSCOPIC RADICAL PROSTATECTOMY N/A 03/17/2013   Procedure: ROBOTIC ASSISTED LAPAROSCOPIC RADICAL PROSTATECTOMY;  Surgeon: Valetta Fuller, MD;  Location: [facility];  Service: Urology;  Laterality: N/A;   TONSILLECTOMY     TOTAL  KNEE ARTHROPLASTY Left 10/15/2016   Procedure: TOTAL KNEE ARTHROPLASTY;  Surgeon: Sheral Apley, MD;  Location: [facility] OR;  Service: Orthopedics;  Laterality: Left;   TRANSURETHRAL RESECTION OF PROSTATE  12/01/2012   Dr Isabel Caprice   UPPER GASTROINTESTINAL ENDOSCOPY     Social History:   reports that he has been smoking cigarettes. He has never used smokeless tobacco. He reports current alcohol use. He reports that he does not use drugs.  Family History  Problem Relation Age of Onset   Cancer Mother        brain   Cancer Brother        Question lung   Drug abuse Maternal Aunt    Colon polyps Neg Hx    Colon cancer Neg Hx    Rectal cancer Neg Hx    Stomach cancer Neg Hx    Esophageal cancer Neg Hx     Medications: Patient's Medications  New Prescriptions   PREDNISONE (STERAPRED UNI-PAK 21 TAB) 10 MG (21) TBPK TABLET    Use as directed  Previous Medications   ALPRAZOLAM (XANAX) 1 MG TABLET    TAKE 1/2 TABLET TWICE DAILY AS NEEDED.   CETIRIZINE (ZYRTEC) 10 MG TABLET    Take 10 mg by mouth daily.   CHOLECALCIFEROL (VITAMIN D) 1000 UNITS TABLET    Take 1,000 Units by mouth daily.   DICLOFENAC SODIUM (VOLTAREN) 1 % GEL    APPLY 4 GRAMS TO AFFECTED AREA THREE TIMES A DAY AS NEEDED FOR L KNEE PAIN   EPINEPHRINE 0.3 MG/0.3 ML IJ SOAJ INJECTION    Inject 0.3 mLs (0.3 mg total) into the muscle once. For allergic reaction to bee stings   ESOMEPRAZOLE (NEXIUM) 20 MG CAPSULE    Take 20 mg by mouth daily.   GABAPENTIN (NEURONTIN) 100 MG CAPSULE    TAKE ONE CAPSULE BY MOUTH EVERY MORNING AND TAKE THREE CAPSULES AT BEDTIME   HYDROCHLOROTHIAZIDE (HYDRODIURIL) 25 MG TABLET    Take 1 tablet by mouth daily.   MUPIROCIN OINTMENT (BACTROBAN) 2 %    APPLY TOPICALLY TO THE AFFECTED AREA TWICE DAILY   VITAMIN B-12 (CYANOCOBALAMIN) 1000 MCG TABLET    Take 1,000 mcg by mouth daily.   VITAMIN E 100 UNIT CAPSULE    Take by mouth daily.  Modified Medications   Modified Medication Previous Medication   ALBUTEROL  (VENTOLIN HFA) 108 (90 BASE) MCG/ACT INHALER albuterol (VENTOLIN HFA) 108 (90 Base) MCG/ACT inhaler      Inhale 2 puffs into the lungs every 6 (six) hours as needed for wheezing or shortness of breath.    Inhale 2 puffs into the lungs every 6 (six) hours as needed for wheezing or shortness of breath.  Discontinued Medications   No medications on file    Physical Exam:  Vitals:   11/15/22 1642  BP: 118/70  Pulse: 73  Temp: 97.9 F (36.6 C)  TempSrc: Temporal  SpO2: 97%  Weight: 74.8 kg  Height: 5' 6.5" (1.689 m)   Body mass index is 26.23 kg/m. Wt Readings from Last 3 Encounters:  11/15/22 74.8 kg  10/01/22 77.7 kg  06/17/22 83 kg    Physical Exam Constitutional:      Appearance: Normal appearance. He is normal weight.  Cardiovascular:     Rate and Rhythm: Normal rate and regular rhythm.     Pulses: Normal pulses.     Heart sounds: Normal heart sounds.  Pulmonary:     Effort: Pulmonary effort is normal.     Breath sounds: Wheezing and rhonchi present.  Abdominal:     General: Bowel sounds are normal.     Palpations: Abdomen is soft.  Musculoskeletal:     Right lower leg: No edema.     Left lower leg: No edema.  Skin:    General: Skin is warm and dry.  Neurological:     General: No focal deficit present.     Mental Status: He is alert and oriented to person, place, and time. Mental status is at baseline.  Psychiatric:        Mood and Affect: Mood normal.     Labs reviewed: Basic Metabolic Panel: Recent Labs    04/12/22 1116 06/17/22 1346  NA 141 134*  K 3.6 3.7  CL 101 93*  CO2 27 31  GLUCOSE 84 104*  BUN 14 22  CREATININE 1.01 1.11  CALCIUM 9.6 9.0   Liver Function Tests: Recent Labs    04/12/22 1116 06/17/22 1346  AST 29 30  ALT 16 19  BILITOT 0.4 1.1  PROT 7.3 6.8   Recent Labs    06/17/22 1346  LIPASE 14  AMYLASE 26   No results for input(s): "AMMONIA" in the last 8760 hours. CBC: Recent Labs    04/12/22 1116 06/17/22 1346   WBC 3.7* 11.4*  NEUTROABS 2,446 10,773*  HGB 14.2 13.4  HCT 40.8 37.3*  MCV 96.9 95.9  PLT 199 142   Lipid Panel: Recent Labs    04/12/22 1116  CHOL 198  HDL 101  LDLCALC 81  TRIG 82  CHOLHDL 2.0   TSH: No results for input(s): "TSH" in the last 8760 hours. A1C: No results found for: "HGBA1C"   Assessment/Plan 1. Primary hypertension -Controlled -Continue hydrochlorothiazide -Encouraged dietary modifications and physical activity as tolerated - COMPLETE METABOLIC PANEL WITH GFR - CBC with Differential/Platelet  2. Primary osteoarthritis of left knee -PRN voltaren and naproxen- patient aware naproxen can cause GI/renal complications, and verbalizes taking less than 2 times per week, encouraged limited use -Continue massage therapy and acupuncture  3. Dyslipidemia -Diet controlled; not on medication- labs from February WNL  -Encouraged dietary modifications and physical activity as tolerated  4. Anxiety -Stable, denies worsening symptoms -[facility]  5. Primary insomnia -Occasional, stable  -Sleep hygiene encouraged  6. Tobacco abuse -Ongoing -Smoking cessation encouraged -Encouraged yearly influenza vaccine and RSV; patient would like to receive at the pharmacy  7. ETOH abuse -Ongoing; trying to cut back but doesn't want to stop necessarily -Encouraged cessation -[facility] - Vitamin B12 - Folate  8. Chest congestion -Mucinex DM twice daily; encouraged hydration -Continue until congestion resolves -Smoking cessation encouraged Due to ongoing wheezing, cough and congestion will have him start prednisone  - predniSONE (STERAPRED UNI-PAK 21 TAB) 10 MG (21) TBPK tablet; Use as directed  Dispense: 21 tablet; Refill: 0 - albuterol (VENTOLIN HFA)  108 (90 Base) MCG/ACT inhaler; Inhale 2 puffs into the lungs every 6 (six) hours as needed for wheezing or shortness of breath.  Dispense: 8 g; Refill: 0  9. B12  deficiency -Continue supplementation - Vitamin B12   Return in about 6 months (around 05/18/2023) for routine follow up.  Rollen Sox, FNP-MSN Student -I personally was present during the history, physical exam and medical decision-making activities of this service and have verified that the service and findings are accurately documented in the student's note Abbey Chatters, NP

## 2022-11-18 NOTE — Patient Instructions (Addendum)
Mucinex DM by mouth twice daily with full glass water.  Continue until congestions resolves Make sure to drink plenty of fluids

## 2022-11-19 LAB — COMPLETE METABOLIC PANEL WITH GFR
AG Ratio: 1.4 (calc) (ref 1.0–2.5)
ALT: 29 U/L (ref 9–46)
AST: 46 U/L — ABNORMAL HIGH (ref 10–35)
Albumin: 4.3 g/dL (ref 3.6–5.1)
Alkaline phosphatase (APISO): 74 U/L (ref 35–144)
BUN: 13 mg/dL (ref 7–25)
CO2: 32 mmol/L (ref 20–32)
Calcium: 9.3 mg/dL (ref 8.6–10.3)
Chloride: 98 mmol/L (ref 98–110)
Creat: 0.98 mg/dL (ref 0.70–1.28)
Globulin: 3 g/dL (calc) (ref 1.9–3.7)
Glucose, Bld: 88 mg/dL (ref 65–139)
Potassium: 3.6 mmol/L (ref 3.5–5.3)
Sodium: 142 mmol/L (ref 135–146)
Total Bilirubin: 0.7 mg/dL (ref 0.2–1.2)
Total Protein: 7.3 g/dL (ref 6.1–8.1)
eGFR: 80 mL/min/{1.73_m2} (ref >=60)

## 2022-11-19 LAB — CBC WITH DIFFERENTIAL/PLATELET
Absolute Monocytes: 435 cells/uL (ref 200–950)
Basophils Absolute: 40 cells/uL (ref 0–200)
Basophils Relative: 0.8 %
Eosinophils Absolute: 100 cells/uL (ref 15–500)
Eosinophils Relative: 2 %
HCT: 39.3 % (ref 38.5–50.0)
Hemoglobin: 13.3 g/dL (ref 13.2–17.1)
Lymphs Abs: 695 cells/uL — ABNORMAL LOW (ref 850–3900)
MCH: 33.4 pg — ABNORMAL HIGH (ref 27.0–33.0)
MCHC: 33.8 g/dL (ref 32.0–36.0)
MCV: 98.7 fL (ref 80.0–100.0)
MPV: 9.9 fL (ref 7.5–12.5)
Monocytes Relative: 8.7 %
Neutro Abs: 3730 cells/uL (ref 1500–7800)
Neutrophils Relative %: 74.6 %
Platelets: 186 10*3/uL (ref 140–400)
RBC: 3.98 10*6/uL — ABNORMAL LOW (ref 4.20–5.80)
RDW: 13.4 % (ref 11.0–15.0)
Total Lymphocyte: 13.9 %
WBC: 5 10*3/uL (ref 3.8–10.8)

## 2022-11-19 LAB — FOLATE: Folate: 7.9 ng/mL

## 2022-11-19 LAB — VITAMIN B12: Vitamin B-12: 567 pg/mL (ref 200–1100)

## 2023-01-08 ENCOUNTER — Other Ambulatory Visit: Payer: Self-pay | Admitting: Family

## 2023-01-08 DIAGNOSIS — F5101 Primary insomnia: Secondary | ICD-10-CM

## 2023-01-08 NOTE — Telephone Encounter (Signed)
Patient is requesting a refill of the following medications: Requested Prescriptions   Pending Prescriptions Disp Refills   mupirocin ointment (BACTROBAN) 2 % [Pharmacy Med Name: MUPIROCIN 2% OINTMENT 22GM] 22 g 1    Sig: APPLY TOPICALLY TO THE AFFECTED AREA TWICE DAILY   ALPRAZolam (XANAX) 1 MG tablet [Pharmacy Med Name: ALPRAZOLAM 1MG  TABLETS] 30 tablet     Sig: TAKE 1/2 TABLET BY MOUTH TWICE DAILY AS NEEDED    Date of last refill:06/17/2022  Refill amount: 60 tablets 2 refills   Treatment agreement date: n/A

## 2023-01-21 ENCOUNTER — Encounter: Payer: Self-pay | Admitting: Sports Medicine

## 2023-01-21 ENCOUNTER — Ambulatory Visit (INDEPENDENT_AMBULATORY_CARE_PROVIDER_SITE_OTHER): Payer: Medicare HMO | Admitting: Sports Medicine

## 2023-01-21 VITALS — BP 121/78 | HR 60 | Temp 97.3°F | Resp 18 | Ht 66.0 in | Wt 167.0 lb

## 2023-01-21 DIAGNOSIS — K219 Gastro-esophageal reflux disease without esophagitis: Secondary | ICD-10-CM

## 2023-01-21 DIAGNOSIS — F5101 Primary insomnia: Secondary | ICD-10-CM

## 2023-01-21 DIAGNOSIS — R42 Dizziness and giddiness: Secondary | ICD-10-CM

## 2023-01-21 DIAGNOSIS — R634 Abnormal weight loss: Secondary | ICD-10-CM | POA: Diagnosis not present

## 2023-01-21 DIAGNOSIS — R319 Hematuria, unspecified: Secondary | ICD-10-CM | POA: Diagnosis not present

## 2023-01-21 DIAGNOSIS — J309 Allergic rhinitis, unspecified: Secondary | ICD-10-CM | POA: Diagnosis not present

## 2023-01-21 MED ORDER — PANTOPRAZOLE SODIUM 40 MG PO TBEC: 40.0000 mg | DELAYED_RELEASE_TABLET | Freq: Two times a day (BID) | ORAL | 3 refills | Status: DC

## 2023-01-21 MED ORDER — FLUTICASONE PROPIONATE 50 MCG/ACT NA SUSP: 2.0000 | Freq: Every day | NASAL | 6 refills | Status: AC

## 2023-01-21 NOTE — Progress Notes (Signed)
Careteam: Patient Care Team: Sharon Seller, NP as PCP - General (Geriatric Medicine) Rene Paci, MD as Consulting Physician (Urology)  PLACE OF SERVICE:  [facility]  Advanced Directive information    Allergies  Allergen Reactions   Bee Venom Swelling    Throat swells    Penicillins Rash and Other (See Comments)    PATIENT HAS HAD A PCN REACTION WITH IMMEDIATE RASH, FACIAL/TONGUE/THROAT SWELLING, SOB, OR LIGHTHEADEDNESS WITH HYPOTENSION:  #  #  #  YES  #  #  #   Has patient had a PCN reaction causing severe rash involving mucus membranes or skin necrosis: No Has patient had a PCN reaction that required hospitalization: No Has patient had a PCN reaction occurring within the last 10 years: No If all of the above answers are "NO", then may proceed with Cephalosporin use.    Hydrocodone-Acetaminophen Nausea And Vomiting    Chief Complaint  Patient presents with   Acute Visit    dizziness, unsteadiness for a while      HPI: Patient is a 77 y.o. male  presented to clinic for acute visit for unsteadiness  States that he feels insteady on his feet when walking  since 3 weeks  Denies lightheaded and room spinning sensation  Drinks about 32 oz of water  C/o tingling and numbness in his feet  Denies headache , blurring or double vision,  Denies swallowing or slurring of speech  C/o runny nose  C/o nasal drainage with sinus tenderness Denies sore throat  C/o ear fullness with bil tinnitus Denies cough , sob  Nausea For a week  Denies acid reflux Only 1 meal  Drinks about 6-8 oz of beer Denies bloody or dark stools Reports poor appetite   Hematuria Denies dysuria, Intermittent  hematuria  Denies flank pain Intermittent night sweats  Follows with Urologist  Wakes up twice in the middle of night to urinate        Review of Systems:  Review of Systems  Constitutional:  Negative for chills and fever.  HENT:  Positive for congestion and ear  pain. Negative for sinus pain and sore throat.   Eyes:  Negative for double vision.  Respiratory:  Negative for cough, sputum production and shortness of breath.   Cardiovascular:  Negative for chest pain, palpitations and leg swelling.  Gastrointestinal:  Positive for nausea. Negative for abdominal pain and heartburn.  Genitourinary:  Positive for hematuria. Negative for dysuria and frequency.  Musculoskeletal:  Negative for falls and myalgias.  Neurological:  Positive for tingling and sensory change. Negative for dizziness and focal weakness.   Negative unless indicated in HPI.   Past Medical History:  Diagnosis Date   Adenomatous colon polyp    Allergy    Anxiety    Arthritis    Cataract    removed bilateral   GERD (gastroesophageal reflux disease)    Graves disease    Per [facility] New Patient Packet   H/O hemorrhoids    H/O hiatal hernia    Heart murmur 2007   per pt history- possible murmur by PCp- states negative stress test   Hyperlipidemia    Pneumonia    dx 03/08/21   Prostate cancer (HCC) 01/20/2013   Sleep apnea    CPAP.  Trying to use it. (NOT USING CURRENTLY )   DX YEARS AGO   Thyroid disease    graves   Ulcer approx 1970   HX peptic ulcer   Past Surgical History:  Procedure Laterality Date   CATARACT EXTRACTION W/ INTRAOCULAR LENS  IMPLANT, BILATERAL     COLONOSCOPY  2017   LYMPHADENECTOMY Bilateral 03/17/2013   Procedure: LYMPHADENECTOMY PELVIC LYMPH NODE DISSECTION;  Surgeon: Valetta Fuller, MD;  Location: [facility];  Service: Urology;  Laterality: Bilateral;   POLYPECTOMY     PROSTATE BIOPSY     ROBOT ASSISTED LAPAROSCOPIC RADICAL PROSTATECTOMY N/A 03/17/2013   Procedure: ROBOTIC ASSISTED LAPAROSCOPIC RADICAL PROSTATECTOMY;  Surgeon: Valetta Fuller, MD;  Location: [facility];  Service: Urology;  Laterality: N/A;   TONSILLECTOMY     TOTAL KNEE ARTHROPLASTY Left 10/15/2016   Procedure: TOTAL KNEE ARTHROPLASTY;  Surgeon: Sheral Apley, MD;  Location[facility] OR;   Service: Orthopedics;  Laterality: Left;   TRANSURETHRAL RESECTION OF PROSTATE  12/01/2012   Dr Isabel Caprice   UPPER GASTROINTESTINAL ENDOSCOPY     Social History:   reports that he has been smoking cigarettes. He has never used smokeless tobacco. He reports current alcohol use. He reports that he does not use drugs.  Family History  Problem Relation Age of Onset   Cancer Mother        brain   Cancer Brother        Question lung   Drug abuse Maternal Aunt    Colon polyps Neg Hx    Colon cancer Neg Hx    Rectal cancer Neg Hx    Stomach cancer Neg Hx    Esophageal cancer Neg Hx     Medications: Patient's Medications  New Prescriptions   No medications on file  Previous Medications   ALBUTEROL (VENTOLIN HFA) 108 (90 BASE) MCG/ACT INHALER    Inhale 2 puffs into the lungs every 6 (six) hours as needed for wheezing or shortness of breath.   ALPRAZOLAM (XANAX) 1 MG TABLET    TAKE 1/2 TABLET BY MOUTH TWICE DAILY AS NEEDED   CETIRIZINE (ZYRTEC) 10 MG TABLET    Take 10 mg by mouth daily.   CHOLECALCIFEROL (VITAMIN D) 1000 UNITS TABLET    Take 1,000 Units by mouth daily.   DICLOFENAC SODIUM (VOLTAREN) 1 % GEL    APPLY 4 GRAMS TO AFFECTED AREA THREE TIMES A DAY AS NEEDED FOR L KNEE PAIN   EPINEPHRINE 0.3 MG/0.3 ML IJ SOAJ INJECTION    Inject 0.3 mLs (0.3 mg total) into the muscle once. For allergic reaction to bee stings   ESOMEPRAZOLE (NEXIUM) 20 MG CAPSULE    Take 20 mg by mouth daily.   GABAPENTIN (NEURONTIN) 100 MG CAPSULE    TAKE ONE CAPSULE BY MOUTH EVERY MORNING AND TAKE THREE CAPSULES AT BEDTIME   HYDROCHLOROTHIAZIDE (HYDRODIURIL) 25 MG TABLET    Take 1 tablet by mouth daily.   MUPIROCIN OINTMENT (BACTROBAN) 2 %    APPLY TOPICALLY TO THE AFFECTED AREA TWICE DAILY   PREDNISONE (STERAPRED UNI-PAK 21 TAB) 10 MG (21) TBPK TABLET    Use as directed   VITAMIN B-12 (CYANOCOBALAMIN) 1000 MCG TABLET    Take 1,000 mcg by mouth daily.   VITAMIN E 100 UNIT CAPSULE    Take by mouth daily.  Modified  Medications   No medications on file  Discontinued Medications   No medications on file    Physical Exam: There were no vitals filed for this visit. There is no height or weight on file to calculate BMI. BP Readings from Last 3 Encounters:  11/15/22 118/70  10/01/22 110/64  06/17/22 110/62   Wt Readings from Last 3 Encounters:  11/15/22 165 lb (74.8 kg)  10/01/22 171 lb 6.4 oz (77.7 kg)  06/17/22 183 lb (83 kg)    Physical Exam Constitutional:      Appearance: Normal appearance.  HENT:     Head: Normocephalic and atraumatic.  Cardiovascular:     Rate and Rhythm: Normal rate and regular rhythm.     Pulses: Normal pulses.     Heart sounds: Normal heart sounds.  Pulmonary:     Effort: No respiratory distress.     Breath sounds: No stridor. No wheezing or rales.  Abdominal:     General: Bowel sounds are normal. There is no distension.     Palpations: Abdomen is soft.     Tenderness: There is no abdominal tenderness. There is no right CVA tenderness or guarding.  Musculoskeletal:        General: No swelling.  Neurological:     Mental Status: He is alert. Mental status is at baseline.     Sensory: No sensory deficit.     Motor: No weakness.     Comments: Rombergs negative Finger nose abnormal left side Mildleft hand tremors No nystagmus Strength and sensations intact     Labs reviewed: Basic Metabolic Panel: Recent Labs    04/12/22 1116 06/17/22 1346 11/18/22 1050  NA 141 134* 142  K 3.6 3.7 3.6  CL 101 93* 98  CO2 27 31 32  GLUCOSE 84 104* 88  BUN 14 22 13   CREATININE 1.01 1.11 0.98  CALCIUM 9.6 9.0 9.3   Liver Function Tests: Recent Labs    04/12/22 1116 06/17/22 1346 11/18/22 1050  AST 29 30 46*  ALT 16 19 29   BILITOT 0.4 1.1 0.7  PROT 7.3 6.8 7.3   Recent Labs    06/17/22 1346  LIPASE 14  AMYLASE 26   No results for input(s): "AMMONIA" in the last 8760 hours. CBC: Recent Labs    04/12/22 1116 06/17/22 1346 11/18/22 1050  WBC 3.7*  11.4* 5.0  NEUTROABS 2,446 10,773* 3,730  HGB 14.2 13.4 13.3  HCT 40.8 37.3* 39.3  MCV 96.9 95.9 98.7  PLT 199 142 186   Lipid Panel: Recent Labs    04/12/22 1116  CHOL 198  HDL 101  LDLCALC 81  TRIG 82  CHOLHDL 2.0   TSH: No results for input(s): "TSH" in the last 8760 hours. A1C: No results found for: "HGBA1C"   Assessment/Plan  1. Primary insomnia Discussed regarding sleep hygiene Take melatonin  Avoid day time naps  2. Dizziness Orthostatic neg Instructed patient  - CBC With Differential/Platelet - Basic Metabolic Panel with eGFR - Ambulatory referral to Physical Therapy - CT HEAD WO CONTRAST ( ); Future  3. Hematuria, unspecified type No bleeding currently  Pt follows with urology  Will order CT abd and pelvis  - CT ABDOMEN PELVIS W CONTRAST; Future  4. Weight loss Will check labs  - Urinalysis - CT ABDOMEN PELVIS W CONTRAST; Future  5. Gastroesophageal reflux disease, unspecified whether esophagitis present Nausea related to alcohol intake  Instructed patient to cut down on drinking  Avoid spicy foods Will change to protoinix twice daily  - pantoprazole (PROTONIX) 40 MG tablet; Take 1 tablet (40 mg total) by mouth 2 (two) times daily before a meal.  Dispense: 180 tablet; Refill: 3  6. Allergic rhinitis, unspecified seasonality, unspecified trigger Will send flonase to pharmacy Take claritin  - fluticasone (FLONASE) 50 MCG/ACT nasal spray; Place 2 sprays into both nostrils daily.  Dispense: 16 g; Refill:  6  Other orders - UNABLE TO FIND; Take 2 capsules by mouth daily. Cit[employer] Vitamin   No follow-ups on file.:   45 total min for preparing for this visit, review of most recent office visit notes, review of multiple chronic medical conditions and their management, review of all medications, review of most recent bloodwork results,   education on nutrition, prognosis, documentation, and need for follow up.

## 2023-01-22 ENCOUNTER — Other Ambulatory Visit: Payer: Self-pay | Admitting: Sports Medicine

## 2023-01-22 LAB — CBC WITH DIFFERENTIAL/PLATELET
Absolute Lymphocytes: 543 {cells}/uL — ABNORMAL LOW (ref 850–3900)
Absolute Monocytes: 454 {cells}/uL (ref 200–950)
Basophils Absolute: 50 {cells}/uL (ref 0–200)
Basophils Relative: 0.9 %
Eosinophils Absolute: 50 {cells}/uL (ref 15–500)
Eosinophils Relative: 0.9 %
HCT: 36 % — ABNORMAL LOW (ref 38.5–50.0)
Hemoglobin: 12.9 g/dL — ABNORMAL LOW (ref 13.2–17.1)
MCH: 34.9 pg — ABNORMAL HIGH (ref 27.0–33.0)
MCHC: 35.8 g/dL (ref 32.0–36.0)
MCV: 97.3 fL (ref 80.0–100.0)
MPV: 10.3 fL (ref 7.5–12.5)
Monocytes Relative: 8.1 %
Neutro Abs: 4502 {cells}/uL (ref 1500–7800)
Neutrophils Relative %: 80.4 %
Platelets: 155 10*3/uL (ref 140–400)
RBC: 3.7 10*6/uL — ABNORMAL LOW (ref 4.20–5.80)
RDW: 11.6 % (ref 11.0–15.0)
Total Lymphocyte: 9.7 %
WBC: 5.6 10*3/uL (ref 3.8–10.8)

## 2023-01-22 LAB — URINALYSIS
Bilirubin Urine: NEGATIVE
Glucose, UA: NEGATIVE
Nitrite: NEGATIVE
Specific Gravity, Urine: 1.011 (ref 1.001–1.035)
pH: 5.5 (ref 5.0–8.0)

## 2023-01-22 LAB — BASIC METABOLIC PANEL WITH GFR
BUN: 16 mg/dL (ref 7–25)
CO2: 30 mmol/L (ref 20–32)
Calcium: 9.2 mg/dL (ref 8.6–10.3)
Chloride: 94 mmol/L — ABNORMAL LOW (ref 98–110)
Creat: 0.96 mg/dL (ref 0.70–1.28)
Glucose, Bld: 93 mg/dL (ref 65–99)
Potassium: 3.4 mmol/L — ABNORMAL LOW (ref 3.5–5.3)
Sodium: 140 mmol/L (ref 135–146)
eGFR: 82 mL/min/{1.73_m2} (ref >=60)

## 2023-01-22 MED ORDER — POTASSIUM CHLORIDE CRYS ER 20 MEQ PO TBCR: 40.0000 meq | EXTENDED_RELEASE_TABLET | Freq: Once | ORAL | 0 refills | Status: DC

## 2023-01-23 ENCOUNTER — Other Ambulatory Visit: Payer: Self-pay | Admitting: Sports Medicine

## 2023-01-24 ENCOUNTER — Other Ambulatory Visit: Payer: Self-pay | Admitting: Sports Medicine

## 2023-01-25 ENCOUNTER — Other Ambulatory Visit: Payer: Self-pay | Admitting: Sports Medicine

## 2023-01-26 ENCOUNTER — Other Ambulatory Visit: Payer: Self-pay | Admitting: Sports Medicine

## 2023-01-27 ENCOUNTER — Other Ambulatory Visit: Payer: Self-pay | Admitting: Sports Medicine

## 2023-01-27 NOTE — Telephone Encounter (Signed)
Requested medication is listed as temporary on active medication list

## 2023-01-27 NOTE — Telephone Encounter (Signed)
Patient has request refill on medication Potassium. Patient medication has end date. Medication pend and sent to PCP Janyth Contes Janene Harvey, NP for approval.

## 2023-01-27 NOTE — Telephone Encounter (Signed)
Patient medication has end date. Medication pend and sent to PCP Janyth Contes Janene Harvey, NP for approval.

## 2023-02-06 ENCOUNTER — Ambulatory Visit
Admission: RE | Admit: 2023-02-06 | Discharge: 2023-02-06 | Disposition: A | Discharge status: Home / Self Care | Payer: Medicare HMO | Source: Ambulatory Visit | Attending: Sports Medicine | Admitting: Sports Medicine

## 2023-02-06 DIAGNOSIS — R634 Abnormal weight loss: Secondary | ICD-10-CM

## 2023-02-06 DIAGNOSIS — R319 Hematuria, unspecified: Secondary | ICD-10-CM

## 2023-02-06 DIAGNOSIS — R310 Gross hematuria: Secondary | ICD-10-CM | POA: Diagnosis not present

## 2023-02-06 DIAGNOSIS — R42 Dizziness and giddiness: Secondary | ICD-10-CM | POA: Diagnosis not present

## 2023-02-06 DIAGNOSIS — R55 Syncope and collapse: Secondary | ICD-10-CM | POA: Diagnosis not present

## 2023-02-06 DIAGNOSIS — K8020 Calculus of gallbladder without cholecystitis without obstruction: Secondary | ICD-10-CM | POA: Diagnosis not present

## 2023-02-06 DIAGNOSIS — I700 Atherosclerosis of aorta: Secondary | ICD-10-CM | POA: Diagnosis not present

## 2023-02-06 MED ORDER — IOPAMIDOL (ISOVUE-300) INJECTION 61%
100.0000 mL | Freq: Once | INTRAVENOUS | Status: AC | PRN
  Administered 2023-02-06: 100 mL via INTRAVENOUS

## 2023-02-10 ENCOUNTER — Ambulatory Visit: Payer: Medicare HMO | Attending: Sports Medicine | Admitting: Physical Therapy

## 2023-02-10 ENCOUNTER — Other Ambulatory Visit: Payer: Self-pay

## 2023-02-10 ENCOUNTER — Encounter: Payer: Self-pay | Admitting: Physical Therapy

## 2023-02-10 DIAGNOSIS — R42 Dizziness and giddiness: Secondary | ICD-10-CM | POA: Insufficient documentation

## 2023-02-10 DIAGNOSIS — R2681 Unsteadiness on feet: Secondary | ICD-10-CM | POA: Insufficient documentation

## 2023-02-10 NOTE — Therapy (Signed)
OUTPATIENT PHYSICAL THERAPY VESTIBULAR EVALUATION     Patient Name: Hunter Porter MRN: [id number] DOB:1946-01-04, 77 y.o., male Today's Date: 02/10/2023  END OF SESSION:  PT End of Session - 02/10/23 1429     Visit Number 1    Number of Visits 10    Date for PT Re-Evaluation 03/14/23    Authorization Type [employer]    PT Start Time 1108   arrives late   PT Stop Time 1155    PT Time Calculation (min) 47 min    Activity Tolerance Patient tolerated treatment well    Behavior During Therapy Restless             Past Medical History:  Diagnosis Date   Adenomatous colon polyp    Allergy    Anxiety    Arthritis    Cataract    removed bilateral   GERD (gastroesophageal reflux disease)    Graves disease    Per [facility] New Patient Packet   H/O hemorrhoids    H/O hiatal hernia    Heart murmur 2007   per pt history- possible murmur by [facility]- states negative stress test   Hyperlipidemia    Pneumonia    dx 03/08/21   Prostate cancer (HCC) 01/20/2013   Sleep apnea    CPAP.  Trying to use it. (NOT USING CURRENTLY )   DX YEARS AGO   Thyroid disease    graves   Ulcer approx 1970   HX peptic ulcer   Past Surgical History:  Procedure Laterality Date   CATARACT EXTRACTION W/ INTRAOCULAR LENS  IMPLANT, BILATERAL     COLONOSCOPY  2017   LYMPHADENECTOMY Bilateral 03/17/2013   Procedure: LYMPHADENECTOMY PELVIC LYMPH NODE DISSECTION;  Surgeon: Valetta Fuller, MD;  Location: [facility];  Service: Urology;  Laterality: Bilateral;   POLYPECTOMY     PROSTATE BIOPSY     ROBOT ASSISTED LAPAROSCOPIC RADICAL PROSTATECTOMY N/A 03/17/2013   Procedure: ROBOTIC ASSISTED LAPAROSCOPIC RADICAL PROSTATECTOMY;  Surgeon: Valetta Fuller, MD;  Location: [facility];  Service: Urology;  Laterality: N/A;   TONSILLECTOMY     TOTAL KNEE ARTHROPLASTY Left 10/15/2016   Procedure: TOTAL KNEE ARTHROPLASTY;  Surgeon: Sheral Apley, MD;  Location: [facility] OR;  Service: Orthopedics;  Laterality: Left;    TRANSURETHRAL RESECTION OF PROSTATE  12/01/2012   Dr Isabel Caprice   UPPER GASTROINTESTINAL ENDOSCOPY     Patient Active Problem List   Diagnosis Date Noted   History of prostate cancer 03/20/2021   Primary osteoarthritis involving multiple joints 03/20/2021   B12 deficiency 03/20/2021   Vitamin D deficiency 03/20/2021   Primary osteoarthritis of left knee 09/23/2016   Abnormal EKG 08/31/2016   Tobacco abuse 08/31/2016   Dyslipidemia 08/31/2016   Preop cardiovascular exam 08/31/2016   OSA on CPAP 08/21/2016   Ventral hernia 08/21/2016   Hypertension 05/29/2016   Lower extremity edema 05/15/2016   Insomnia 06/01/2015   GERD (gastroesophageal reflux disease)    Anxiety    Thyroid disease     PCP: Sharon Seller, NP REFERRING PROVIDER: Venita Sheffield, MD  REFERRING DIAG: R42 (ICD-10-CM) - Dizziness   THERAPY DIAG:  Dizziness and giddiness  Unsteadiness on feet  ONSET DATE: 01/21/2023 (MD referral)  Rationale for Evaluation and Treatment: Rehabilitation  SUBJECTIVE:   SUBJECTIVE STATEMENT: Had some vertigo that started 3-4 weeks ago.  Did a CT scan, but no results yet.  Sometimes when I lie down, I get a spinning sensation.  When I would stand up  initially, I couldn't hardly work.  Used some drops in my ears and it began to clear up.  Pt accompanied by: self  PERTINENT HISTORY: See above for PMH; 3-4 hx of dizziness (at [facility]-orthostatic BP negative)  PAIN:  Are you having pain? No  PRECAUTIONS: None  RED FLAGS: None   WEIGHT BEARING RESTRICTIONS: No  FALLS: Has patient fallen in last 6 months? No  LIVING ENVIRONMENT: Lives with: lives with their son Lives in: House/apartment Stairs:  3 steps to enter Has following equipment at home: Single point cane  PLOF: Independent; Likes to play golf  PATIENT GOALS: To get rid of this dizziness  OBJECTIVE:  Note: Objective measures were completed at Evaluation unless otherwise noted.  DIAGNOSTIC FINDINGS: CT  scan-no results yet (from 02/06/2023)  COGNITION: Overall cognitive status: Within functional limits for tasks assessed    POSTURE:  rounded shoulders and forward head  Cervical ROM:  WFL A/ROM, with report of tightness in neck with rotation   TRANSFERS: Assistive device utilized: None  Sit to stand: Modified independence Stand to sit: Modified independence  GAIT: Gait pattern: step through pattern and wide BOS Distance walked: 40 ft x 2 Assistive device utilized: None Level of assistance: SBA Comments: Guarded gait pattern  PATIENT SURVEYS:  FOTO 56; predicted 58 *Pt does note improvement of dizziness since onset*  VESTIBULAR ASSESSMENT:  GENERAL OBSERVATION: No acute distress   SYMPTOM BEHAVIOR:  Subjective history: has history of Graves disease; sometimes sees double with head turns (not new)  Non-Vestibular symptoms:  NA  Type of dizziness: Imbalance (Disequilibrium), Spinning/Vertigo, and Unsteady with head/body turns  Frequency: almost daily, but is better  Duration: seconds for spinning  Aggravating factors: Induced by position change: lying supine and supine to sit  Relieving factors: slow movements and using cane  Progression of symptoms: better  OCULOMOTOR EXAM:  Ocular Alignment:  L eye more wide than R; hx of Graves  Ocular ROM: No Limitations  Spontaneous Nystagmus: absent  Gaze-Induced Nystagmus: absent  Smooth Pursuits: intact  Saccades: intact  Convergence/Divergence: approx 6 inches-L eye abducts (reports he's never been able to "cross his eyes")   VESTIBULAR - OCULAR REFLEX:   Slow VOR: Comment: No symptoms, but L eye has difficulty focusing  VOR Cancellation: Corrective Saccades to the R side  Head-Impulse Test: HIT Right: negative HIT Left: negative  Dynamic Visual Acuity:  NT   POSITIONAL TESTING: Right Dix-Hallpike: upbeating, right nystagmus, slight dizziness upon sitting up, and Duration: 10 sec Left Dix-Hallpike: no nystagmus and  feels a "tinge" upon sitting up Right Roll Test: no nystagmus Left Roll Test: no nystagmus Pt performs R and L DH slowly and with guarded motion   M-CTSIB  Condition 1: Firm Surface, EO 30 Sec, Normal Sway  Condition 2: Firm Surface, EC 30 Sec, Mild Sway  Condition 3: Foam Surface, EO 30 Sec, Mild Sway  Condition 4: Foam Surface, EC 16  Sec, Severe Sway      VESTIBULAR TREATMENT:  DATE: 02/10/2023  Canalith Repositioning:  Epley Right: Number of Reps: 2, Response to Treatment: symptoms improved, and Comment: Pt tolerates well, but both times, notes dizziness upon sitting up EOM.    PATIENT EDUCATION: Education details: Eval results, POC; rationale/explanation for BPPV findings and treatment Person educated: Patient Education method: Explanation, Demonstration, and Verbal cues Education comprehension: verbalized understanding  HOME EXERCISE PROGRAM:  GOALS: Goals reviewed with patient? Yes  SHORT TERM GOALS: = LTG   LONG TERM GOALS: Target date: 03/14/2023  Pt will be independent with HEP for improved dizziness. Baseline:  Goal status: INITIAL  2.  Pt will report 0/10 dizziness with bed mobility. Baseline:  Goal status: INITIAL  3.  FOTO to improve to 58, indicating decreased overall dizziness. Baseline: 56 Goal status: INITIAL  4.  Pt will perform Condition 4 of MCTSIB for 30 seconds with moderate or less sway, for improved vestibular system use for balance. Baseline:  Goal status: INITIAL   ASSESSMENT:  CLINICAL IMPRESSION: Patient is a 77 y.o. male who was seen today for physical therapy evaluation and treatment for dizziness.   He reports onset of spinning type dizziness sensation about 3-4 weeks ago, with no know cause.  Pt denies fall, hitting head, virus, any new hearing or vision changes.  He reports sensation comes on with getting in or out of bed, but  dizziness/unsteadiness remains for most of the day.  He does report that his overall dizziness is improving.  With oculomotor test and VOR testing, pt does not have any reports of symptoms.  Of note, he has hx of Graves disease and reports double vision at times with head rotation (this is not new); with oculomotor testing, he is unable to perform convergence with L eye (L eye abducts at about 6 inches).  With positional testing for BPPV, he reports spinning and R rotary nystagmus noted with R DH.  Treated with R Epley maneuver x 2, with pt noting less symptoms each time in initial position, but he does report dizziness upon sitting up EOM.  He does tend to initiate the maneuver very slowly.  He will benefit from skilled PT to further address BPPV and treat as appropriate.  He does demo decreased vestibular system use for balance on MCTSIB and he may benefit from treatment of balance as well, all for overall improved functional mobility and decreased fall risk.  OBJECTIVE IMPAIRMENTS: Abnormal gait, decreased balance, and dizziness.   ACTIVITY LIMITATIONS: transfers, bed mobility, toileting, and locomotion level  PARTICIPATION LIMITATIONS: community activity, yard work, and golfing  PERSONAL FACTORS: 3+ comorbidities: See above for PMH  are also affecting patient's functional outcome.   REHAB POTENTIAL: Good  CLINICAL DECISION MAKING: Stable/uncomplicated  EVALUATION COMPLEXITY: Low   PLAN:  PT FREQUENCY: 1-2x/week  PT DURATION: other: 5 weeks  PLANNED INTERVENTIONS: 97110-Therapeutic exercises, 97530- Therapeutic activity, [id number]- Neuromuscular re-education, 97535- Self Care, 23557- Manual therapy, [phone]- Gait training, [phone]- Canalith repositioning, Patient/Family education, Balance training, and Vestibular training  PLAN FOR NEXT SESSION: Reassess for R posterior canal BPPV and treat as appropriate.  May want to initiate Brandt-Daroff as part of HEP.  Work on balance exercises for  compliant surfaces.   Gean Maidens., PT 02/10/2023, 2:30 PM   [facility] [address] [place], [address] [place], [place] [address] Phone # [phone] Fax # [phone]

## 2023-02-14 ENCOUNTER — Ambulatory Visit: Payer: Medicare HMO

## 2023-02-14 DIAGNOSIS — R42 Dizziness and giddiness: Secondary | ICD-10-CM

## 2023-02-14 DIAGNOSIS — R2681 Unsteadiness on feet: Secondary | ICD-10-CM

## 2023-02-14 NOTE — Therapy (Signed)
OUTPATIENT PHYSICAL THERAPY VESTIBULAR TREATMENT     Patient Name: Hunter Porter MRN: [id number] DOB:October 06, 1945, 77 y.o., male Today's Date: 02/14/2023  END OF SESSION:  PT End of Session - 02/14/23 1059     Visit Number 2    Number of Visits 10    Date for PT Re-Evaluation 03/14/23    Authorization Type [employer] Medicare    PT Start Time 1100    PT Stop Time 1145    PT Time Calculation (min) 45 min    Activity Tolerance Patient tolerated treatment well    Behavior During Therapy Restless             Past Medical History:  Diagnosis Date   Adenomatous colon polyp    Allergy    Anxiety    Arthritis    Cataract    removed bilateral   GERD (gastroesophageal reflux disease)    Graves disease    Per [facility] New Patient Packet   H/O hemorrhoids    H/O hiatal hernia    Heart murmur 2007   per pt history- possible murmur by PCp- states negative stress test   Hyperlipidemia    Pneumonia    dx 03/08/21   Prostate cancer (HCC) 01/20/2013   Sleep apnea    CPAP.  Trying to use it. (NOT USING CURRENTLY )   DX YEARS AGO   Thyroid disease    graves   Ulcer approx 1970   HX peptic ulcer   Past Surgical History:  Procedure Laterality Date   CATARACT EXTRACTION W/ INTRAOCULAR LENS  IMPLANT, BILATERAL     COLONOSCOPY  2017   LYMPHADENECTOMY Bilateral 03/17/2013   Procedure: LYMPHADENECTOMY PELVIC LYMPH NODE DISSECTION;  Surgeon: Valetta Fuller, MD;  Location: [facility];  Service: Urology;  Laterality: Bilateral;   POLYPECTOMY     PROSTATE BIOPSY     ROBOT ASSISTED LAPAROSCOPIC RADICAL PROSTATECTOMY N/A 03/17/2013   Procedure: ROBOTIC ASSISTED LAPAROSCOPIC RADICAL PROSTATECTOMY;  Surgeon: Valetta Fuller, MD;  Location: [facility];  Service: Urology;  Laterality: N/A;   TONSILLECTOMY     TOTAL KNEE ARTHROPLASTY Left 10/15/2016   Procedure: TOTAL KNEE ARTHROPLASTY;  Surgeon: Sheral Apley, MD;  Location: [facility] OR;  Service: Orthopedics;  Laterality: Left;   TRANSURETHRAL RESECTION  OF PROSTATE  12/01/2012   Dr Isabel Caprice   UPPER GASTROINTESTINAL ENDOSCOPY     Patient Active Problem List   Diagnosis Date Noted   History of prostate cancer 03/20/2021   Primary osteoarthritis involving multiple joints 03/20/2021   B12 deficiency 03/20/2021   Vitamin D deficiency 03/20/2021   Primary osteoarthritis of left knee 09/23/2016   Abnormal EKG 08/31/2016   Tobacco abuse 08/31/2016   Dyslipidemia 08/31/2016   Preop cardiovascular exam 08/31/2016   OSA on CPAP 08/21/2016   Ventral hernia 08/21/2016   Hypertension 05/29/2016   Lower extremity edema 05/15/2016   Insomnia 06/01/2015   GERD (gastroesophageal reflux disease)    Anxiety    Thyroid disease     PCP: Sharon Seller, NP REFERRING PROVIDER: Venita Sheffield, MD  REFERRING DIAG: R42 (ICD-10-CM) - Dizziness   THERAPY DIAG:  Dizziness and giddiness  Unsteadiness on feet  ONSET DATE: 01/21/2023 (MD referral)  Rationale for Evaluation and Treatment: Rehabilitation  SUBJECTIVE:   SUBJECTIVE STATEMENT: Things feel like they're clearing up, notice that balance is improved Pt accompanied by: self  PERTINENT HISTORY: See above for PMH; 3-4 hx of dizziness (at [facility]-orthostatic BP negative)  PAIN:  Are you having pain?  No  PRECAUTIONS: None  RED FLAGS: None   WEIGHT BEARING RESTRICTIONS: No  FALLS: Has patient fallen in last 6 months? No  LIVING ENVIRONMENT: Lives with: lives with their son Lives in: House/apartment Stairs:  3 steps to enter Has following equipment at home: Single point cane  PLOF: Independent; Likes to play golf  PATIENT GOALS: To get rid of this dizziness  OBJECTIVE:   TODAY'S TREATMENT: 02/14/23 Activity Comments  Dix-Hallpike Left: no symptoms, no nystagmus Right: right upbeating nystagmus  R Epley maneuver   R Dix-Hallpike No nystagmus, no vertigo  Education on Brandt-Daroff Provided HEP sheet if vertigo returns          Note: Objective measures were  completed at Evaluation unless otherwise noted.  DIAGNOSTIC FINDINGS: CT scan-no results yet (from 02/06/2023)  COGNITION: Overall cognitive status: Within functional limits for tasks assessed    POSTURE:  rounded shoulders and forward head  Cervical ROM:  WFL A/ROM, with report of tightness in neck with rotation   TRANSFERS: Assistive device utilized: None  Sit to stand: Modified independence Stand to sit: Modified independence  GAIT: Gait pattern: step through pattern and wide BOS Distance walked: 40 ft x 2 Assistive device utilized: None Level of assistance: SBA Comments: Guarded gait pattern  PATIENT SURVEYS:  FOTO 56; predicted 58 *Pt does note improvement of dizziness since onset*  VESTIBULAR ASSESSMENT:  GENERAL OBSERVATION: No acute distress   SYMPTOM BEHAVIOR:  Subjective history: has history of Graves disease; sometimes sees double with head turns (not new)  Non-Vestibular symptoms:  NA  Type of dizziness: Imbalance (Disequilibrium), Spinning/Vertigo, and Unsteady with head/body turns  Frequency: almost daily, but is better  Duration: seconds for spinning  Aggravating factors: Induced by position change: lying supine and supine to sit  Relieving factors: slow movements and using cane  Progression of symptoms: better  OCULOMOTOR EXAM:  Ocular Alignment:  L eye more wide than R; hx of Graves  Ocular ROM: No Limitations  Spontaneous Nystagmus: absent  Gaze-Induced Nystagmus: absent  Smooth Pursuits: intact  Saccades: intact  Convergence/Divergence: approx 6 inches-L eye abducts (reports he's never been able to "cross his eyes")   VESTIBULAR - OCULAR REFLEX:   Slow VOR: Comment: No symptoms, but L eye has difficulty focusing  VOR Cancellation: Corrective Saccades to the R side  Head-Impulse Test: HIT Right: negative HIT Left: negative  Dynamic Visual Acuity:  NT   POSITIONAL TESTING: Right Dix-Hallpike: upbeating, right nystagmus, slight dizziness  upon sitting up, and Duration: 10 sec Left Dix-Hallpike: no nystagmus and feels a "tinge" upon sitting up Right Roll Test: no nystagmus Left Roll Test: no nystagmus Pt performs R and L DH slowly and with guarded motion   M-CTSIB  Condition 1: Firm Surface, EO 30 Sec, Normal Sway  Condition 2: Firm Surface, EC 30 Sec, Mild Sway  Condition 3: Foam Surface, EO 30 Sec, Mild Sway  Condition 4: Foam Surface, EC 16  Sec, Severe Sway      VESTIBULAR TREATMENT:  DATE: 02/10/2023  Canalith Repositioning:  Epley Right: Number of Reps: 2, Response to Treatment: symptoms improved, and Comment: Pt tolerates well, but both times, notes dizziness upon sitting up EOM.    PATIENT EDUCATION: Education details: Eval results, POC; rationale/explanation for BPPV findings and treatment Person educated: Patient Education method: Explanation, Demonstration, and Verbal cues Education comprehension: verbalized understanding  HOME EXERCISE PROGRAM:  GOALS: Goals reviewed with patient? Yes  SHORT TERM GOALS: = LTG   LONG TERM GOALS: Target date: 03/14/2023  Pt will be independent with HEP for improved dizziness. Baseline:  Goal status: INITIAL  2.  Pt will report 0/10 dizziness with bed mobility. Baseline:  Goal status: INITIAL  3.  FOTO to improve to 58, indicating decreased overall dizziness. Baseline: 56 Goal status: INITIAL  4.  Pt will perform Condition 4 of MCTSIB for 30 seconds with moderate or less sway, for improved vestibular system use for balance. Baseline:  Goal status: INITIAL   ASSESSMENT:  CLINICAL IMPRESSION: Reports overall improved symptoms with reduced episodes and balance improvement. Follow-up positional testing negative for left side involvement. Right Dix-Hallpike with 5 sec right upbeating nystagmus. Performed Right Epley maneuver and no vertigo/nystagmus to follow-up  right Dix-Hallpike.  Discussed and demonstrated performance of Brandt-Daroff with bias to initiating with right sidelying and then performing subsequent repetitions. Verbalizes understanding. Follow-up recommended for assessment and tx as indicated  OBJECTIVE IMPAIRMENTS: Abnormal gait, decreased balance, and dizziness.   ACTIVITY LIMITATIONS: transfers, bed mobility, toileting, and locomotion level  PARTICIPATION LIMITATIONS: community activity, yard work, and golfing  PERSONAL FACTORS: 3+ comorbidities: See above for PMH  are also affecting patient's functional outcome.   REHAB POTENTIAL: Good  CLINICAL DECISION MAKING: Stable/uncomplicated  EVALUATION COMPLEXITY: Low   PLAN:  PT FREQUENCY: 1-2x/week  PT DURATION: other: 5 weeks  PLANNED INTERVENTIONS: 97110-Therapeutic exercises, 97530- Therapeutic activity, [id number]- Neuromuscular re-education, 97535- Self Care, 09811- Manual therapy, [phone]- Gait training, [phone]- Canalith repositioning, Patient/Family education, Balance training, and Vestibular training  PLAN FOR NEXT SESSION: Reassess for R posterior canal BPPV and treat as appropriate.  May want to initiate Brandt-Daroff as part of HEP.  Work on balance exercises for compliant surfaces.   Dion Body, PT 02/14/2023, 11:00 AM   [facility] [address] [place], [address] [place], [place] [address] Phone # [phone] Fax # [phone]

## 2023-02-24 ENCOUNTER — Encounter: Payer: Self-pay | Admitting: Physical Therapy

## 2023-02-24 ENCOUNTER — Ambulatory Visit: Payer: Medicare HMO | Admitting: Physical Therapy

## 2023-02-24 DIAGNOSIS — R2681 Unsteadiness on feet: Secondary | ICD-10-CM

## 2023-02-24 DIAGNOSIS — R42 Dizziness and giddiness: Secondary | ICD-10-CM | POA: Diagnosis not present

## 2023-02-24 NOTE — Therapy (Signed)
OUTPATIENT PHYSICAL THERAPY VESTIBULAR TREATMENT     Patient Name: Hunter Porter MRN: [id number] DOB:05-14-1945, 77 y.o., male Today's Date: 02/24/2023  END OF SESSION:  PT End of Session - 02/24/23 1318     Visit Number 3    Number of Visits 10    Date for PT Re-Evaluation 03/14/23    Authorization Type [employer] Medicare    PT Start Time 1319    PT Stop Time 1400    PT Time Calculation (min) 41 min    Activity Tolerance Patient tolerated treatment well    Behavior During Therapy [facility] for tasks assessed/performed              Past Medical History:  Diagnosis Date   Adenomatous colon polyp    Allergy    Anxiety    Arthritis    Cataract    removed bilateral   GERD (gastroesophageal reflux disease)    Graves disease    Per [facility] New Patient Packet   H/O hemorrhoids    H/O hiatal hernia    Heart murmur 2007   per pt history- possible murmur by PC[facility]- states negative stress test   Hyperlipidemia    Pneumonia    dx 03/08/21   Prostate cancer (HCC) 01/20/2013   Sleep apnea    CPAP.  Trying to use it. (NOT USING CURRENTLY )   DX YEARS AGO   Thyroid disease    graves   Ulcer approx 1970   HX peptic ulcer   Past Surgical History:  Procedure Laterality Date   CATARACT EXTRACTION W/ INTRAOCULAR LENS  IMPLANT, BILATERAL     COLONOSCOPY  2017   LYMPHADENECTOMY Bilateral 03/17/2013   Procedure: LYMPHADENECTOMY PELVIC LYMPH NODE DISSECTION;  Surgeon: Valetta Fuller, MD;  Location: [facility];  Service: Urology;  Laterality: Bilateral;   POLYPECTOMY     PROSTATE BIOPSY     ROBOT ASSISTED LAPAROSCOPIC RADICAL PROSTATECTOMY N/A 03/17/2013   Procedure: ROBOTIC ASSISTED LAPAROSCOPIC RADICAL PROSTATECTOMY;  Surgeon: Valetta Fuller, MD;  Location: [facility];  Service: Urology;  Laterality: N/A;   TONSILLECTOMY     TOTAL KNEE ARTHROPLASTY Left 10/15/2016   Procedure: TOTAL KNEE ARTHROPLASTY;  Surgeon: Sheral Apley, MD;  Location: [facility] OR;  Service: Orthopedics;  Laterality: Left;    TRANSURETHRAL RESECTION OF PROSTATE  12/01/2012   Dr Isabel Caprice   UPPER GASTROINTESTINAL ENDOSCOPY     Patient Active Problem List   Diagnosis Date Noted   History of prostate cancer 03/20/2021   Primary osteoarthritis involving multiple joints 03/20/2021   B12 deficiency 03/20/2021   Vitamin D deficiency 03/20/2021   Primary osteoarthritis of left knee 09/23/2016   Abnormal EKG 08/31/2016   Tobacco abuse 08/31/2016   Dyslipidemia 08/31/2016   Preop cardiovascular exam 08/31/2016   OSA on CPAP 08/21/2016   Ventral hernia 08/21/2016   Hypertension 05/29/2016   Lower extremity edema 05/15/2016   Insomnia 06/01/2015   GERD (gastroesophageal reflux disease)    Anxiety    Thyroid disease     PCP: Sharon Seller, NP REFERRING PROVIDER: Venita Sheffield, MD  REFERRING DIAG: R42 (ICD-10-CM) - Dizziness   THERAPY DIAG:  Dizziness and giddiness  Unsteadiness on feet  ONSET DATE: 01/21/2023 (MD referral)  Rationale for Evaluation and Treatment: Rehabilitation  SUBJECTIVE:   SUBJECTIVE STATEMENT: Feels like things are very much improved.  Only very momentarily happening going down to right side.  Did not try the exercise. Pt accompanied by: self  PERTINENT HISTORY: See above for  PMH; 3-4 hx of dizziness (at [facility]-orthostatic BP negative)  PAIN:  Are you having pain? No  PRECAUTIONS: None  RED FLAGS: None   WEIGHT BEARING RESTRICTIONS: No  FALLS: Has patient fallen in last 6 months? No  LIVING ENVIRONMENT: Lives with: lives with their son Lives in: House/apartment Stairs:  3 steps to enter Has following equipment at home: Single point cane  PLOF: Independent; Likes to play golf  PATIENT GOALS: To get rid of this dizziness  OBJECTIVE:    TODAY'S TREATMENT: 02/24/2023 Activity Comments  R DH 3-5 sec R rotational nystagmus with symptoms  R Epley Tolerates well, slight dizziness upon sitting up  L DH Negative  R DH 3-5 sec R rotational nystagmus  R Epley  Tolerates well, slight dizziness upon sitting up  Brandt-Daroff to R, 2 reps Almost impercievable, 1-2 seconds  Stand on foam:  EO/EC narrow BOS 30 seconds with mod>severe sway  Corner Balance exercise: Feet apart/feet together EO and EC head turns/nods 5 reps Mild sway>mod sway with EC  FOTO:  67 Improved from 55    Access Code: [email or web address] URL: [email or web address] Date: 02/24/2023 Prepared by: [facility] - Outpatient  Rehab - [facility]  Exercises - Brandt-Daroff Vestibular Exercise  - 1-2 x daily - 7 x weekly - 1 sets - 3-5 reps - 30 sec hold - Corner Balance Feet Apart: Eyes Closed With Head Turns  - 1 x daily - 7 x weekly - 1-2 sets - 5 reps - Corner Balance Feet Together: Eyes Open With Head Turns  - 1 x daily - 7 x weekly - 1-2 sets - 5 reps  PATIENT EDUCATION: Education details: HEP additions, POC Person educated: Patient Education method: Explanation, Demonstration, Verbal cues, and Handouts Education comprehension: verbalized understanding and returned demonstration   --------------------------------------------------------------------------------- Note: Objective measures were completed at Evaluation unless otherwise noted.  DIAGNOSTIC FINDINGS: CT scan-no results yet (from 02/06/2023)  COGNITION: Overall cognitive status: Within functional limits for tasks assessed    POSTURE:  rounded shoulders and forward head  Cervical ROM:  WFL A/ROM, with report of tightness in neck with rotation   TRANSFERS: Assistive device utilized: None  Sit to stand: Modified independence Stand to sit: Modified independence  GAIT: Gait pattern: step through pattern and wide BOS Distance walked: 40 ft x 2 Assistive device utilized: None Level of assistance: SBA Comments: Guarded gait pattern  PATIENT SURVEYS:  FOTO 56; predicted 58 *Pt does note improvement of dizziness since onset*  VESTIBULAR ASSESSMENT:  GENERAL OBSERVATION: No acute  distress   SYMPTOM BEHAVIOR:  Subjective history: has history of Graves disease; sometimes sees double with head turns (not new)  Non-Vestibular symptoms:  NA  Type of dizziness: Imbalance (Disequilibrium), Spinning/Vertigo, and Unsteady with head/body turns  Frequency: almost daily, but is better  Duration: seconds for spinning  Aggravating factors: Induced by position change: lying supine and supine to sit  Relieving factors: slow movements and using cane  Progression of symptoms: better  OCULOMOTOR EXAM:  Ocular Alignment:  L eye more wide than R; hx of Graves  Ocular ROM: No Limitations  Spontaneous Nystagmus: absent  Gaze-Induced Nystagmus: absent  Smooth Pursuits: intact  Saccades: intact  Convergence/Divergence: approx 6 inches-L eye abducts (reports he's never been able to "cross his eyes")   VESTIBULAR - OCULAR REFLEX:   Slow VOR: Comment: No symptoms, but L eye has difficulty focusing  VOR Cancellation: Corrective Saccades to the R side  Head-Impulse Test: HIT Right: negative HIT Left: negative  Dynamic Visual Acuity:  NT   POSITIONAL TESTING: Right Dix-Hallpike: upbeating, right nystagmus, slight dizziness upon sitting up, and Duration: 10 sec Left Dix-Hallpike: no nystagmus and feels a "tinge" upon sitting up Right Roll Test: no nystagmus Left Roll Test: no nystagmus Pt performs R and L DH slowly and with guarded motion   M-CTSIB  Condition 1: Firm Surface, EO 30 Sec, Normal Sway  Condition 2: Firm Surface, EC 30 Sec, Mild Sway  Condition 3: Foam Surface, EO 30 Sec, Mild Sway  Condition 4: Foam Surface, EC 16  Sec, Severe Sway      VESTIBULAR TREATMENT:                                                                                                   DATE: 02/10/2023  Canalith Repositioning:  Epley Right: Number of Reps: 2, Response to Treatment: symptoms improved, and Comment: Pt tolerates well, but both times, notes dizziness upon sitting up EOM.     PATIENT EDUCATION: Education details: Eval results, POC; rationale/explanation for BPPV findings and treatment Person educated: Patient Education method: Explanation, Demonstration, and Verbal cues Education comprehension: verbalized understanding  HOME EXERCISE PROGRAM:  GOALS: Goals reviewed with patient? Yes  SHORT TERM GOALS: = LTG   LONG TERM GOALS: Target date: 03/14/2023  Pt will be independent with HEP for improved dizziness. Baseline:  Goal status: IN PROGRESS  2.  Pt will report 0/10 dizziness with bed mobility. Baseline:  Goal status: IN PROGRESS  3.  FOTO to improve to 58, indicating decreased overall dizziness. Baseline: 56>67 02/24/2023 Goal status: MET  4.  Pt will perform Condition 4 of MCTSIB for 30 seconds with moderate or less sway, for improved vestibular system use for balance. Baseline:  Goal status: IN PROGRESS   ASSESSMENT:  CLINICAL IMPRESSION: Pt presents today continuing to report decrease in overall dizziness symptoms, only noting briefly with lying down to R side.  With R Dix Hallpike symptoms, he does have several seconds of symptoms, mild nystagmus noted, though L eye moves more than R (he does have hx of L eye being affected with Graves disease). Repeated treatment with Epley x 2, still with very slight nystagmus and symptoms.  He reports not yet doing Brandt-Daroff, so performed today for review and explained to do this for habituation.  FOTO score noted improveemnt, and some improvement in Condition 4 of MCTSIB, but still increased sway.  Addressed this with updates to HEP for corner balance.  He is comfortable continueing to try his HEP and returning for reassessment of dizziness and balance in two weeks.  OBJECTIVE IMPAIRMENTS: Abnormal gait, decreased balance, and dizziness.   ACTIVITY LIMITATIONS: transfers, bed mobility, toileting, and locomotion level  PARTICIPATION LIMITATIONS: community activity, yard work, and golfing  PERSONAL  FACTORS: 3+ comorbidities: See above for PMH  are also affecting patient's functional outcome.   REHAB POTENTIAL: Good  CLINICAL DECISION MAKING: Stable/uncomplicated  EVALUATION COMPLEXITY: Low   PLAN:  PT FREQUENCY: 1-2x/week  PT DURATION: other: 5 weeks  PLANNED INTERVENTIONS: 97110-Therapeutic exercises, 97530- Therapeutic activity, [id number]- Neuromuscular re-education, 97535- Self Care,  09811- Manual therapy, [id number]- Gait training, 91478- Canalith repositioning, Patient/Family education, Balance training, and Vestibular training  PLAN FOR NEXT SESSION: Reassess for R posterior canal BPPV and treat as appropriate.  Review Brandt-Daroff as part of HEP and balance HEP; .Check remaining LTGs and discuss POC.   Gean Maidens., PT 02/24/2023, 5:05 PM   [facility] [address] [place], [address] [place], [place] [address] Phone # [phone] Fax # [phone]

## 2023-03-12 ENCOUNTER — Ambulatory Visit: Payer: Medicare HMO | Attending: Sports Medicine | Admitting: Physical Therapy

## 2023-03-12 ENCOUNTER — Encounter: Payer: Self-pay | Admitting: Physical Therapy

## 2023-03-12 DIAGNOSIS — R2681 Unsteadiness on feet: Secondary | ICD-10-CM | POA: Insufficient documentation

## 2023-03-12 DIAGNOSIS — R42 Dizziness and giddiness: Secondary | ICD-10-CM | POA: Insufficient documentation

## 2023-03-12 NOTE — Therapy (Signed)
 OUTPATIENT PHYSICAL THERAPY VESTIBULAR TREATMENT     Patient Name: Hunter Porter MRN: [id number] DOB:07-31-1945, 78 y.o., male Today's Date: 03/12/2023  END OF SESSION:  PT End of Session - 03/12/23 1159     Visit Number 4    Number of Visits 10    Date for PT Re-Evaluation 03/14/23    Authorization Type [employer]na Medicare    PT Start Time 1157   Pt arrives late   PT Stop Time 1220    PT Time Calculation (min) 23 min    Activity Tolerance Patient tolerated treatment well    Behavior During Therapy [facility] for tasks assessed/performed               Past Medical History:  Diagnosis Date   Adenomatous colon polyp    Allergy    Anxiety    Arthritis    Cataract    removed bilateral   GERD (gastroesophageal reflux disease)    Graves disease    Per [facility] New Patient Packet   H/O hemorrhoids    H/O hiatal hernia    Heart murmur 2007   per pt history- possible murmur by PCp- states negative stress test   Hyperlipidemia    Pneumonia    dx 03/08/21   Prostate cancer (HCC) 01/20/2013   Sleep apnea    CPAP.  Trying to use it. (NOT USING CURRENTLY )   DX YEARS AGO   Thyroid disease    graves   Ulcer approx 1970   HX peptic ulcer   Past Surgical History:  Procedure Laterality Date   CATARACT EXTRACTION W/ INTRAOCULAR LENS  IMPLANT, BILATERAL     COLONOSCOPY  2017   LYMPHADENECTOMY Bilateral 03/17/2013   Procedure: LYMPHADENECTOMY PELVIC LYMPH NODE DISSECTION;  Surgeon: Livingston Rigg, MD;  Location: [facility];  Service: Urology;  Laterality: Bilateral;   POLYPECTOMY     PROSTATE BIOPSY     ROBOT ASSISTED LAPAROSCOPIC RADICAL PROSTATECTOMY N/A 03/17/2013   Procedure: ROBOTIC ASSISTED LAPAROSCOPIC RADICAL PROSTATECTOMY;  Surgeon: Livingston Rigg, MD;  Location: [facility];  Service: Urology;  Laterality: N/A;   TONSILLECTOMY     TOTAL KNEE ARTHROPLASTY Left 10/15/2016   Procedure: TOTAL KNEE ARTHROPLASTY;  Surgeon: Saundra Curl, MD;  Location: [facility] OR;  Service: Orthopedics;   Laterality: Left;   TRANSURETHRAL RESECTION OF PROSTATE  12/01/2012   Dr Bosie Bye   UPPER GASTROINTESTINAL ENDOSCOPY     Patient Active Problem List   Diagnosis Date Noted   History of prostate cancer 03/20/2021   Primary osteoarthritis involving multiple joints 03/20/2021   B12 deficiency 03/20/2021   Vitamin D  deficiency 03/20/2021   Primary osteoarthritis of left knee 09/23/2016   Abnormal EKG 08/31/2016   Tobacco abuse 08/31/2016   Dyslipidemia 08/31/2016   Preop cardiovascular exam 08/31/2016   OSA on CPAP 08/21/2016   Ventral hernia 08/21/2016   Hypertension 05/29/2016   Lower extremity edema 05/15/2016   Insomnia 06/01/2015   GERD (gastroesophageal reflux disease)    Anxiety    Thyroid disease     PCP: Verma Gobble, NP REFERRING PROVIDER: Tye Gall, MD  REFERRING DIAG: R42 (ICD-10-CM) - Dizziness   THERAPY DIAG:  Dizziness and giddiness  Unsteadiness on feet  ONSET DATE: 01/21/2023 (MD referral)  Rationale for Evaluation and Treatment: Rehabilitation  SUBJECTIVE:   SUBJECTIVE STATEMENT: Much, much improved, but still a little dizziness very briefly upon lying down.  Doing exercise and feels like it is incrementally getting better.    Pt  accompanied by: self  PERTINENT HISTORY: See above for PMH; 3-4 hx of dizziness (at [facility]-orthostatic BP negative)  PAIN:  Are you having pain? No  PRECAUTIONS: None  RED FLAGS: None   WEIGHT BEARING RESTRICTIONS: No  FALLS: Has patient fallen in last 6 months? No  LIVING ENVIRONMENT: Lives with: lives with their son Lives in: House/apartment Stairs:  3 steps to enter Has following equipment at home: Single point cane  PLOF: Independent; Likes to play golf  PATIENT GOALS: To get rid of this dizziness  OBJECTIVE:    TODAY'S TREATMENT: 03/12/2023 Activity Comments  Reviewed Brandt-Daroff Performed x 2, rates 4/10 symptoms lying down to R; good return demo understanding  R [employer] Pt  closes eyes initially, did not see nystagmus; pt reports momentary dizziness, resolves <3 seconds (pt reports he feels comfortable and does not want to proceed with Epley)  Verbally reviewed corner balance and pt verb understanding                 M-CTSIB  Condition 1: Firm Surface, EO 30 Sec, Normal Sway  Condition 2: Firm Surface, EC 30 Sec, Mild Sway  Condition 3: Foam Surface, EO 30 Sec, Mild Sway  Condition 4: Foam Surface, EC 30 Sec, Moderate Sway        PATIENT EDUCATION: Education details: Continue HEP, rationale for habituation, staying well hydrated, general movement as means to continue to improve dizziness Person educated: Patient Education method: Explanation, Demonstration, Verbal cues, and Handouts Education comprehension: verbalized understanding and returned demonstration   --------------------------------------------------------------------------------- Note: Objective measures were completed at Evaluation unless otherwise noted.  DIAGNOSTIC FINDINGS: CT scan-no results yet (from 02/06/2023)  COGNITION: Overall cognitive status: Within functional limits for tasks assessed    POSTURE:  rounded shoulders and forward head  Cervical ROM:  WFL A/ROM, with report of tightness in neck with rotation   TRANSFERS: Assistive device utilized: None  Sit to stand: Modified independence Stand to sit: Modified independence  GAIT: Gait pattern: step through pattern and wide BOS Distance walked: 40 ft x 2 Assistive device utilized: None Level of assistance: SBA Comments: Guarded gait pattern  PATIENT SURVEYS:  FOTO 56; predicted 58 *Pt does note improvement of dizziness since onset*  VESTIBULAR ASSESSMENT:  GENERAL OBSERVATION: No acute distress   SYMPTOM BEHAVIOR:  Subjective history: has history of Graves disease; sometimes sees double with head turns (not new)  Non-Vestibular symptoms:  NA  Type of dizziness: Imbalance (Disequilibrium),  Spinning/Vertigo, and Unsteady with head/body turns  Frequency: almost daily, but is better  Duration: seconds for spinning  Aggravating factors: Induced by position change: lying supine and supine to sit  Relieving factors: slow movements and using cane  Progression of symptoms: better  OCULOMOTOR EXAM:  Ocular Alignment:  L eye more wide than R; hx of Graves  Ocular ROM: No Limitations  Spontaneous Nystagmus: absent  Gaze-Induced Nystagmus: absent  Smooth Pursuits: intact  Saccades: intact  Convergence/Divergence: approx 6 inches-L eye abducts (reports he's never been able to "cross his eyes")   VESTIBULAR - OCULAR REFLEX:   Slow VOR: Comment: No symptoms, but L eye has difficulty focusing  VOR Cancellation: Corrective Saccades to the R side  Head-Impulse Test: HIT Right: negative HIT Left: negative  Dynamic Visual Acuity:  NT   POSITIONAL TESTING: Right Dix-Hallpike: upbeating, right nystagmus, slight dizziness upon sitting up, and Duration: 10 sec Left Dix-Hallpike: no nystagmus and feels a "tinge" upon sitting up Right Roll Test: no nystagmus Left Roll Test: no  nystagmus Pt performs R and L DH slowly and with guarded motion   M-CTSIB  Condition 1: Firm Surface, EO 30 Sec, Normal Sway  Condition 2: Firm Surface, EC 30 Sec, Mild Sway  Condition 3: Foam Surface, EO 30 Sec, Mild Sway  Condition 4: Foam Surface, EC 16  Sec, Severe Sway      VESTIBULAR TREATMENT:                                                                                                   DATE: 02/10/2023  Canalith Repositioning:  Epley Right: Number of Reps: 2, Response to Treatment: symptoms improved, and Comment: Pt tolerates well, but both times, notes dizziness upon sitting up EOM.    PATIENT EDUCATION: Education details: Eval results, POC; rationale/explanation for BPPV findings and treatment Person educated: Patient Education method: Explanation, Demonstration, and Verbal cues Education  comprehension: verbalized understanding  HOME EXERCISE PROGRAM:  GOALS: Goals reviewed with patient? Yes  SHORT TERM GOALS: = LTG   LONG TERM GOALS: Target date: 03/14/2023  Pt will be independent with HEP for improved dizziness. Baseline:  Goal status: MET  2.  Pt will report 0/10 dizziness with bed mobility. Baseline: 4/10 (brief 2-3 seconds, reports much improved) 03/12/2023 Goal status: IN PROGRESS  3.  FOTO to improve to 58, indicating decreased overall dizziness. Baseline: 56>67 02/24/2023 Goal status: MET  4.  Pt will perform Condition 4 of MCTSIB for 30 seconds with moderate or less sway, for improved vestibular system use for balance. Baseline: mod sway 03/12/2023 Goal status: MET 03/12/2023   ASSESSMENT:  CLINICAL IMPRESSION: Assessed goals today and pt has met LTG 1, 3, 4.  He reports very brief low grade dizziness with lying down to R side, but notes it is much improved.  Reviewed HEP and pt return demo understanding.  Tested R [employer], did not see nystagmus, but pt notes very brief dizziness that resolves in <3 seconds.  He requests not to do Epley today, and reports he would like to continue exercises at home, feeling good about how he is feeling.  Reviewed rationale and importance of HEP and pt verbalizes understanding.  Place pt on hold, keeping chart open for 30 days, and pt can return if he has increased dizziness during this time; otherwise, will discharge after 30 days.  OBJECTIVE IMPAIRMENTS: Abnormal gait, decreased balance, and dizziness.   ACTIVITY LIMITATIONS: transfers, bed mobility, toileting, and locomotion level  PARTICIPATION LIMITATIONS: community activity, yard work, and golfing  PERSONAL FACTORS: 3+ comorbidities: See above for PMH  are also affecting patient's functional outcome.   REHAB POTENTIAL: Good  CLINICAL DECISION MAKING: Stable/uncomplicated  EVALUATION COMPLEXITY: Low   PLAN:  PT FREQUENCY: 1-2x/week  PT DURATION:  other: 5 weeks  PLANNED INTERVENTIONS: 97110-Therapeutic exercises, 97530- Therapeutic activity, [id number]- Neuromuscular re-education, 97535- Self Care, 91478- Manual therapy, [phone]- Gait training, [phone]- Canalith repositioning, Patient/Family education, Balance training, and Vestibular training  PLAN FOR NEXT SESSION: Pt on hold for 30 days to return if dizziness returns; otherwise, plan for d/c in 30 days.   Xylina Rhoads W., PT 03/12/2023,  12:29 PM   [facility] at [facility] [address] [place], [address] [place], [place] [address] Phone # [phone] Fax # [phone]

## 2023-04-15 ENCOUNTER — Encounter: Payer: Self-pay | Admitting: Physical Therapy

## 2023-04-15 NOTE — Therapy (Signed)
[facility] [address], [address] [place], [place], [address] Phone: [phone]   Fax:  [phone]  Patient Details  Name: Hunter Porter MRN: [id number] Date of Birth: [birth year] Referring Provider:  No ref. provider found  Encounter Date: 04/15/2023   PHYSICAL THERAPY DISCHARGE SUMMARY  Visits from Start of Care: 4  Current functional level related to goals / functional outcomes:  Pt has met 3 of 4 LTGs. LONG TERM GOALS: Target date: 03/14/2023   Pt will be independent with HEP for improved dizziness. Baseline:  Goal status: MET   2.  Pt will report 0/10 dizziness with bed mobility. Baseline: 4/10 (brief 2-3 seconds, reports much improved) 03/12/2023 Goal status: IN PROGRESS   3.  FOTO to improve to 58, indicating decreased overall dizziness. Baseline: 56>67 02/24/2023 Goal status: MET   4.  Pt will perform Condition 4 of MCTSIB for 30 seconds with moderate or less sway, for improved vestibular system use for balance. Baseline: mod sway 03/12/2023 Goal status: MET 03/12/2023   Remaining deficits: Mild dizziness with bed mobility, improved   Education / Equipment: Educated in HEP   Patient agrees to discharge. Patient goals were partially met. Patient is being discharged due to being pleased with the current functional level.   Markesia Crilly W., PT 04/15/2023, 9:18 AM  [facility] [address] [place], [place], [address] Phone: [phone]   Fax:  [phone]

## 2023-04-19 ENCOUNTER — Ambulatory Visit
Admission: EM | Admit: 2023-04-19 | Discharge: 2023-04-19 | Disposition: A | Discharge status: Home / Self Care | Payer: Medicare HMO | Attending: Family Medicine | Admitting: Family Medicine

## 2023-04-19 DIAGNOSIS — J069 Acute upper respiratory infection, unspecified: Secondary | ICD-10-CM | POA: Diagnosis not present

## 2023-04-19 DIAGNOSIS — R112 Nausea with vomiting, unspecified: Secondary | ICD-10-CM | POA: Diagnosis not present

## 2023-04-19 LAB — POC COVID19/FLU A&B COMBO
Covid Antigen, POC: NEGATIVE
Influenza A Antigen, POC: NEGATIVE
Influenza B Antigen, POC: NEGATIVE

## 2023-04-19 MED ORDER — BENZONATATE 200 MG PO CAPS: 200.0000 mg | ORAL_CAPSULE | Freq: Three times a day (TID) | ORAL | 0 refills | Status: DC | PRN

## 2023-04-19 MED ORDER — ONDANSETRON 4 MG PO TBDP: 4.0000 mg | ORAL_TABLET | Freq: Three times a day (TID) | ORAL | 0 refills | Status: DC | PRN

## 2023-04-19 NOTE — Discharge Instructions (Signed)
 I suspect you have a viral respiratory infection.  I have sent over a nausea medication and a cough medication and you may take medications over-the-counter such as Flonase nasal spray, Coricidin HBP, Tylenol.  Stay well-hydrated, get lots of rest

## 2023-04-19 NOTE — ED Triage Notes (Signed)
 Pt reports he has a headache, coughing with mucus, vertigo, and n/v x 4 days

## 2023-04-19 NOTE — ED Provider Notes (Signed)
 [facility]    CSN: [id number] Arrival date & time: 04/19/23  1033      History   Chief Complaint No chief complaint on file.   HPI Hunter Porter is a 78 y.o. male.   Presenting today with 4-day history of headache, productive cough, congestion, nausea, vomiting.  Denies fever, body aches, chest pain, shortness of breath, diarrhea, rashes.  So for now try anything over-the-counter for symptoms.  States he is feeling a bit better today.    Past Medical History:  Diagnosis Date   Adenomatous colon polyp    Allergy    Anxiety    Arthritis    Cataract    removed bilateral   GERD (gastroesophageal reflux disease)    Graves disease    Per [facility] New Patient Packet   H/O hemorrhoids    H/O hiatal hernia    Heart murmur 2007   per pt history- possible murmur by [facility]- states negative stress test   Hyperlipidemia    Pneumonia    dx 03/08/21   Prostate cancer (HCC) 01/20/2013   Sleep apnea    CPAP.  Trying to use it. (NOT USING CURRENTLY )   DX YEARS AGO   Thyroid disease    graves   Ulcer approx 1970   HX peptic ulcer    Patient Active Problem List   Diagnosis Date Noted   History of prostate cancer 03/20/2021   Primary osteoarthritis involving multiple joints 03/20/2021   B12 deficiency 03/20/2021   Vitamin D deficiency 03/20/2021   Primary osteoarthritis of left knee 09/23/2016   Abnormal EKG 08/31/2016   Tobacco abuse 08/31/2016   Dyslipidemia 08/31/2016   Preop cardiovascular exam 08/31/2016   OSA on CPAP 08/21/2016   Ventral hernia 08/21/2016   Hypertension 05/29/2016   Lower extremity edema 05/15/2016   Insomnia 06/01/2015   GERD (gastroesophageal reflux disease)    Anxiety    Thyroid disease     Past Surgical History:  Procedure Laterality Date   CATARACT EXTRACTION W/ INTRAOCULAR LENS  IMPLANT, BILATERAL     COLONOSCOPY  2017   LYMPHADENECTOMY Bilateral 03/17/2013   Procedure: LYMPHADENECTOMY PELVIC LYMPH NODE DISSECTION;  Surgeon:  Valetta Fuller, MD;  Location: [facility];  Service: Urology;  Laterality: Bilateral;   POLYPECTOMY     PROSTATE BIOPSY     ROBOT ASSISTED LAPAROSCOPIC RADICAL PROSTATECTOMY N/A 03/17/2013   Procedure: ROBOTIC ASSISTED LAPAROSCOPIC RADICAL PROSTATECTOMY;  Surgeon: Valetta Fuller, MD;  Location: [facility];  Service: Urology;  Laterality: N/A;   TONSILLECTOMY     TOTAL KNEE ARTHROPLASTY Left 10/15/2016   Procedure: TOTAL KNEE ARTHROPLASTY;  Surgeon: Sheral Apley, MD;  Location: [facility] OR;  Service: Orthopedics;  Laterality: Left;   TRANSURETHRAL RESECTION OF PROSTATE  12/01/2012   Dr Isabel Caprice   UPPER GASTROINTESTINAL ENDOSCOPY         Home Medications    Prior to Admission medications   Medication Sig Start Date End Date Taking? Authorizing Provider  benzonatate (TESSALON) 200 MG capsule Take 1 capsule (200 mg total) by mouth 3 (three) times daily as needed for cough. 04/19/23  Yes Particia Nearing, PA-C  ondansetron (ZOFRAN-ODT) 4 MG disintegrating tablet Take 1 tablet (4 mg total) by mouth every 8 (eight) hours as needed for nausea or vomiting. 04/19/23  Yes Particia Nearing, PA-C  albuterol (VENTOLIN HFA) 108 (90 Base) MCG/ACT inhaler Inhale 2 puffs into the lungs every 6 (six) hours as needed for wheezing or shortness of  breath. 11/18/22   Sharon Seller, NP  ALPRAZolam Prudy Feeler) 1 MG tablet TAKE 1/2 TABLET BY MOUTH TWICE DAILY AS NEEDED 01/08/23   Sharon Seller, NP  cetirizine (ZYRTEC) 10 MG tablet Take 10 mg by mouth daily.    [provider]  cholecalciferol (VITAMIN D) 1000 units tablet Take 1,000 Units by mouth daily.    [provider]  diclofenac Sodium (VOLTAREN) 1 % GEL APPLY 4 GRAMS TO AFFECTED AREA THREE TIMES A DAY AS NEEDED FOR L KNEE PAIN 09/06/20   [provider]  EPINEPHrine 0.3 mg/0.3 mL IJ SOAJ injection Inject 0.3 mLs (0.3 mg total) into the muscle once. For allergic reaction to bee stings 11/02/13   Salley Scarlet, MD  fluticasone  [facility]) 50 MCG/ACT nasal spray Place 2 sprays into both nostrils daily. 01/21/23   Venita Sheffield, MD  gabapentin (NEURONTIN) 100 MG capsule TAKE ONE CAPSULE BY MOUTH EVERY MORNING AND TAKE THREE CAPSULES AT BEDTIME 06/15/20   [provider]  hydrochlorothiazide (HYDRODIURIL) 25 MG tablet Take 1 tablet by mouth daily. 09/06/20   [provider]  mupirocin ointment (BACTROBAN) 2 % APPLY TOPICALLY TO THE AFFECTED AREA TWICE DAILY 01/08/23   Sharon Seller, NP  pantoprazole (PROTONIX) 40 MG tablet Take 1 tablet (40 mg total) by mouth 2 (two) times daily before a meal. 01/21/23   Venita Sheffield, MD  potassium chloride SA (KLOR-CON M) 20 MEQ tablet Take 2 tablets (40 mEq total) by mouth once for 1 dose. 01/22/23 01/22/23  Venita Sheffield, MD  predniSONE (STERAPRED UNI-PAK 21 TAB) 10 MG (21) TBPK tablet Use as directed 11/18/22   Sharon Seller, NP  UNABLE TO FIND Take 2 capsules by mouth daily. Citrus & Pinellia Vitamin    [provider]  vitamin B-12 (CYANOCOBALAMIN) 1000 MCG tablet Take 1,000 mcg by mouth daily.    [provider]  vitamin E 100 UNIT capsule Take by mouth daily.    [provider]    Family History Family History  Problem Relation Age of Onset   Cancer Mother        brain   Cancer Brother        Question lung   Drug abuse Maternal Aunt    Colon polyps Neg Hx    Colon cancer Neg Hx    Rectal cancer Neg Hx    Stomach cancer Neg Hx    Esophageal cancer Neg Hx     Social History Social History   Tobacco Use   Smoking status: Every Day    Current packs/day: 0.50    Types: Cigarettes   Smokeless tobacco: Never  Vaping Use   Vaping status: Never Used  Substance Use Topics   Alcohol use: Yes    Comment: occassion   Drug use: No     Allergies   Bee venom, Penicillins, and Hydrocodone-acetaminophen   Review of Systems Review of Systems Per HPI  Physical Exam Triage Vital Signs ED Triage  Vitals  Encounter Vitals Group     BP 04/19/23 1116 (!) 143/73     Systolic BP Percentile --      Diastolic BP Percentile --      Pulse Rate 04/19/23 1116 62     Resp 04/19/23 1116 20     Temp 04/19/23 1116 97.7 F (36.5 C)     Temp Source 04/19/23 1116 Oral     SpO2 04/19/23 1116 94 %     Weight --  Height --      Head Circumference --      Peak Flow --      Pain Score 04/19/23 1115 0     Pain Loc --      Pain Education --      Exclude from Growth Chart --    No data found.  Updated Vital Signs BP (!) 143/73 (BP Location: Right Arm)   Pulse 62   Temp 97.7 F (36.5 C) (Oral)   Resp 20   SpO2 94%   Visual Acuity Right Eye Distance:   Left Eye Distance:   Bilateral Distance:    Right Eye Near:   Left Eye Near:    Bilateral Near:     Physical Exam Vitals and nursing note reviewed.  Constitutional:      Appearance: He is well-developed.  HENT:     Head: Atraumatic.     Right Ear: External ear normal.     Left Ear: External ear normal.     Nose: Rhinorrhea present.     Mouth/Throat:     Pharynx: Posterior oropharyngeal erythema present. No oropharyngeal exudate.  Eyes:     Conjunctiva/sclera: Conjunctivae normal.     Pupils: Pupils are equal, round, and reactive to light.  Cardiovascular:     Rate and Rhythm: Normal rate and regular rhythm.  Pulmonary:     Effort: Pulmonary effort is normal. No respiratory distress.     Breath sounds: No wheezing or rales.  Musculoskeletal:        General: Normal range of motion.     Cervical back: Normal range of motion and neck supple.  Lymphadenopathy:     Cervical: No cervical adenopathy.  Skin:    General: Skin is warm and dry.  Neurological:     Mental Status: He is alert and oriented to person, place, and time.  Psychiatric:        Behavior: Behavior normal.      UC Treatments / Results  Labs (all labs ordered are listed, but only abnormal results are displayed) Labs Reviewed  POC COVID19/FLU A&B  COMBO    EKG   Radiology No results found.  Procedures Procedures (including critical care time)  Medications Ordered in UC Medications - No data to display  Initial Impression / Assessment and Plan / UC Course  I have reviewed the triage vital signs and the nursing notes.  Pertinent labs & imaging results that were available during my care of the patient were reviewed by me and considered in my medical decision making (see chart for details).     Vitals and exam reassuring today, overall well-appearing and in no acute distress.  Rapid flu and COVID-negative.  Suspect viral respiratory infection causing symptoms.  Treat symptomatically with Zofran, Tessalon, supportive over-the-counter medications and home care.  Return for worsening symptoms.  Final Clinical Impressions(s) / UC Diagnoses   Final diagnoses:  Viral URI with cough  Nausea and vomiting, unspecified vomiting type     Discharge Instructions      I suspect you have a viral respiratory infection.  I have sent over a nausea medication and a cough medication and you may take medications over-the-counter such as Flonase nasal spray, Coricidin HBP, Tylenol.  Stay well-hydrated, get lots of rest    ED Prescriptions     Medication Sig Dispense Auth. Provider   ondansetron (ZOFRAN-ODT) 4 MG disintegrating tablet Take 1 tablet (4 mg total) by mouth every 8 (eight) hours as needed for nausea  or vomiting. 20 tablet Particia Nearing, [place]   benzonatate (TESSALON) 200 MG capsule Take 1 capsule (200 mg total) by mouth 3 (three) times daily as needed for cough. 20 capsule Particia Nearing, [place]      PDMP not reviewed this encounter.   Particia Nearing, [place] 04/19/23 1210

## 2023-05-19 ENCOUNTER — Ambulatory Visit: Payer: Medicare HMO | Admitting: Nurse Practitioner

## 2023-05-23 ENCOUNTER — Encounter: Payer: Self-pay | Admitting: Adult Health

## 2023-05-23 ENCOUNTER — Ambulatory Visit: Payer: Self-pay

## 2023-05-23 ENCOUNTER — Ambulatory Visit: Admitting: Adult Health

## 2023-05-23 ENCOUNTER — Other Ambulatory Visit: Payer: Self-pay | Admitting: Nurse Practitioner

## 2023-05-23 VITALS — BP 114/60 | HR 70 | Temp 98.7°F | Resp 21 | Ht 66.0 in | Wt 166.4 lb

## 2023-05-23 DIAGNOSIS — F5101 Primary insomnia: Secondary | ICD-10-CM

## 2023-05-23 DIAGNOSIS — I10 Essential (primary) hypertension: Secondary | ICD-10-CM

## 2023-05-23 DIAGNOSIS — R600 Localized edema: Secondary | ICD-10-CM | POA: Diagnosis not present

## 2023-05-23 DIAGNOSIS — G629 Polyneuropathy, unspecified: Secondary | ICD-10-CM

## 2023-05-23 DIAGNOSIS — F419 Anxiety disorder, unspecified: Secondary | ICD-10-CM | POA: Diagnosis not present

## 2023-05-23 DIAGNOSIS — R42 Dizziness and giddiness: Secondary | ICD-10-CM

## 2023-05-23 DIAGNOSIS — Z720 Tobacco use: Secondary | ICD-10-CM | POA: Diagnosis not present

## 2023-05-23 MED ORDER — GABAPENTIN 100 MG PO CAPS: 100.0000 mg | ORAL_CAPSULE | Freq: Every day | ORAL | 3 refills | Status: DC

## 2023-05-23 MED ORDER — GABAPENTIN 100 MG PO CAPS: 200.0000 mg | ORAL_CAPSULE | Freq: Every day | ORAL | Status: DC

## 2023-05-23 MED ORDER — HYDROCHLOROTHIAZIDE 25 MG PO TABS: 25.0000 mg | ORAL_TABLET | Freq: Every day | ORAL | Status: DC | PRN

## 2023-05-23 NOTE — Telephone Encounter (Signed)
 Patient has request refill on medication Xanax. Patient medication last refilled 01/08/2023. Patient has no contract on file. Patient has upcoming appointment 10/14/2023. Sign Contract added to apportionment notes. Medication pend and sent to PCP Janyth Contes Janene Harvey, NP for approval.

## 2023-05-23 NOTE — Telephone Encounter (Signed)
 Copied from CRM [id number]. Topic: Clinical - Red Word Triage >> May 23, 2023  1:35 PM Dennison Nancy wrote: Red Word that prompted transfer to Nurse Triage: dizzy and vertigo  Patient mention he been have flu symptoms over a month, having muscle aches  Chief Complaint: dizziness, flu like symptoms Symptoms: muscle aches, dizzy Frequency: constant Pertinent Negatives: Patient denies cp, sob Disposition: [] ED /[] Urgent Care (no appt availability in office) / [x] Appointment(In office/virtual)/ []  [facility]/ [] Home Care/ [] Refused Recommended Disposition /[] [place] [facility]/ []  Follow-up with PCP Additional Notes: per protocol apt made for today; care advice given, denies questions; instructed to go to ER if becomes worse.   Reason for Disposition  [1] MODERATE dizziness (e.g., vertigo; feels very unsteady, interferes with normal activities) AND [2] has NOT been evaluated by doctor (or NP/PA) for this  Answer Assessment - Initial Assessment Questions 1. DESCRIPTION: "Describe your dizziness."     tilting 2. VERTIGO: "Do you feel like either you or the room is spinning or tilting?"      tilting 3. LIGHTHEADED: "Do you feel lightheaded?" (e.g., somewhat faint, woozy, weak upon standing)     Yes, weak  4. SEVERITY: "How bad is it?"  "Can you walk?"   - MILD: Feels slightly dizzy and unsteady, but is walking normally.   - MODERATE: Feels unsteady when walking, but not falling; interferes with normal activities (e.g., school, work).   - SEVERE: Unable to walk without falling, or requires assistance to walk without falling.     moderate 5. ONSET:  "When did the dizziness begin?"     Month ago or longer 6. AGGRAVATING FACTORS: "Does anything make it worse?" (e.g., standing, change in head position)     Doesn't seem to get better; has been doing exercises 7. CAUSE: "What do you think is causing the dizziness?"     unknown 8. RECURRENT SYMPTOM: "Have you had dizziness before?" If  Yes, ask: "When was the last time?" "What happened that time?"     Denies  9. OTHER SYMPTOMS: "Do you have any other symptoms?" (e.g., headache, weakness, numbness, vomiting, earache)     Weakness, headache, numbness, vomiting 10. PREGNANCY: "Is there any chance you are pregnant?" "When was your last menstrual period?"       na  Protocols used: Dizziness - Vertigo-A-AH

## 2023-05-23 NOTE — Progress Notes (Signed)
 [facility] clinic  Provider:  Kenard Gower DNP  Code Status:   Full Code  Goals of Care:     02/10/2023   11:10 AM  Advanced Directives  Does Patient Have a Medical Advance Directive? No  Would patient like information on creating a medical advance directive? No - Patient declined     Chief Complaint  Patient presents with   Dizziness    Patient concerns about a cough and body aches.   Discussed the use of AI scribe software for clinical note transcription with the patient, who gave verbal consent to proceed.  HPI: Patient is a 78 y.o. male seen today for an acute visit for dizziness. He lives with his son, who assists him as needed.  He has been experiencing persistent dizziness since September, which worsens with quick movements and upon waking. He needs to sit on the edge of the bed in the morning due to dizziness and feeling unsteady. Physical therapy exercises have provided some relief, but dizziness persists, especially in the morning. He drove himself to the appointment, indicating some improvement from the previous day when he felt terrible and could hardly walk. CT head done on 02/06/23 showed no acute intracranial process.  He reports flu-like symptoms starting in mid-February, including congestion and aches in his shoulders and wrists. No fever or chills, but he notes a slight increase in temperature from his usual baseline. He experiences some coughing with clear sputum.  He has a history of hypertension but is not currently taking medication for it due to dehydration concerns. His blood pressure was noted to be 114/60. He takes hydrochlorothiazide for lower extremity edema but has not taken it for about a week due to increased fluid loss. He takes potassium supplements and gabapentin for cramps and neuropathy, with a dosage of 200 mg in the morning and 400 mg at night. He reports variable effectiveness of gabapentin for neuropathy symptoms.  He mentions a decreased  appetite over the past week but maintains his weight. He eats oatmeal for breakfast and plans to have soup for dinner. No stomach pain and regular bowel movements.  He smokes about 10-11 cigarettes a day and has quit drinking alcohol.     Past Medical History:  Diagnosis Date   Adenomatous colon polyp    Allergy    Anxiety    Arthritis    Cataract    removed bilateral   GERD (gastroesophageal reflux disease)    Graves disease    Per [facility] New Patient Packet   H/O hemorrhoids    H/O hiatal hernia    Heart murmur 2007   per pt history- possible murmur by PCp- states negative stress test   Hyperlipidemia    Pneumonia    dx 03/08/21   Prostate cancer (HCC) 01/20/2013   Sleep apnea    CPAP.  Trying to use it. (NOT USING CURRENTLY )   DX YEARS AGO   Thyroid disease    graves   Ulcer approx 1970   HX peptic ulcer    Past Surgical History:  Procedure Laterality Date   CATARACT EXTRACTION W/ INTRAOCULAR LENS  IMPLANT, BILATERAL     COLONOSCOPY  2017   LYMPHADENECTOMY Bilateral 03/17/2013   Procedure: LYMPHADENECTOMY PELVIC LYMPH NODE DISSECTION;  Surgeon: Valetta Fuller, MD;  Location: [facility];  Service: Urology;  Laterality: Bilateral;   POLYPECTOMY     PROSTATE BIOPSY     ROBOT ASSISTED LAPAROSCOPIC RADICAL PROSTATECTOMY N/A 03/17/2013   Procedure: ROBOTIC ASSISTED  LAPAROSCOPIC RADICAL PROSTATECTOMY;  Surgeon: Valetta Fuller, MD;  Location: [facility];  Service: Urology;  Laterality: N/A;   TONSILLECTOMY     TOTAL KNEE ARTHROPLASTY Left 10/15/2016   Procedure: TOTAL KNEE ARTHROPLASTY;  Surgeon: Sheral Apley, MD;  Location: [facility] OR;  Service: Orthopedics;  Laterality: Left;   TRANSURETHRAL RESECTION OF PROSTATE  12/01/2012   Dr Isabel Caprice   UPPER GASTROINTESTINAL ENDOSCOPY      Allergies  Allergen Reactions   Bee Venom Swelling    Throat swells    Penicillins Rash and Other (See Comments)    PATIENT HAS HAD A PCN REACTION WITH IMMEDIATE RASH, FACIAL/TONGUE/THROAT SWELLING, SOB,  OR LIGHTHEADEDNESS WITH HYPOTENSION:  #  #  #  YES  #  #  #   Has patient had a PCN reaction causing severe rash involving mucus membranes or skin necrosis: No Has patient had a PCN reaction that required hospitalization: No Has patient had a PCN reaction occurring within the last 10 years: No If all of the above answers are "NO", then may proceed with Cephalosporin use.    Hydrocodone-Acetaminophen Nausea And Vomiting    Outpatient Encounter Medications as of 05/23/2023  Medication Sig   albuterol (VENTOLIN HFA) 108 (90 Base) MCG/ACT inhaler Inhale 2 puffs into the lungs every 6 (six) hours as needed for wheezing or shortness of breath.   cetirizine (ZYRTEC) 10 MG tablet Take 10 mg by mouth daily.   cholecalciferol (VITAMIN D) 1000 units tablet Take 1,000 Units by mouth daily.   diclofenac Sodium (VOLTAREN) 1 % GEL APPLY 4 GRAMS TO AFFECTED AREA THREE TIMES A DAY AS NEEDED FOR L KNEE PAIN   EPINEPHrine 0.3 mg/0.3 mL IJ SOAJ injection Inject 0.3 mLs (0.3 mg total) into the muscle once. For allergic reaction to bee stings   fluticasone (FLONASE) 50 MCG/ACT nasal spray Place 2 sprays into both nostrils daily.   gabapentin (NEURONTIN) 100 MG capsule Take 2 capsules (200 mg total) by mouth at bedtime.   ondansetron (ZOFRAN-ODT) 4 MG disintegrating tablet Take 1 tablet (4 mg total) by mouth every 8 (eight) hours as needed for nausea or vomiting.   vitamin B-12 (CYANOCOBALAMIN) 1000 MCG tablet Take 1,000 mcg by mouth daily.   vitamin E 100 UNIT capsule Take by mouth daily.   [DISCONTINUED] ALPRAZolam (XANAX) 1 MG tablet TAKE 1/2 TABLET BY MOUTH TWICE DAILY AS NEEDED   [DISCONTINUED] benzonatate (TESSALON) 200 MG capsule Take 1 capsule (200 mg total) by mouth 3 (three) times daily as needed for cough.   [DISCONTINUED] gabapentin (NEURONTIN) 100 MG capsule TAKE ONE CAPSULE BY MOUTH EVERY MORNING AND TAKE THREE CAPSULES AT BEDTIME   [DISCONTINUED] hydrochlorothiazide (HYDRODIURIL) 25 MG tablet Take 1  tablet by mouth daily.   [DISCONTINUED] mupirocin ointment (BACTROBAN) 2 % APPLY TOPICALLY TO THE AFFECTED AREA TWICE DAILY   gabapentin (NEURONTIN) 100 MG capsule Take 1 capsule (100 mg total) by mouth daily.   hydrochlorothiazide (HYDRODIURIL) 25 MG tablet Take 1 tablet (25 mg total) by mouth daily as needed.   potassium chloride SA (KLOR-CON M) 20 MEQ tablet Take 2 tablets (40 mEq total) by mouth once for 1 dose.   [DISCONTINUED] pantoprazole (PROTONIX) 40 MG tablet Take 1 tablet (40 mg total) by mouth 2 (two) times daily before a meal. (Patient not taking: Reported on 05/23/2023)   [DISCONTINUED] predniSONE (STERAPRED UNI-PAK 21 TAB) 10 MG (21) TBPK tablet Use as directed (Patient not taking: Reported on 05/23/2023)   [DISCONTINUED] UNABLE TO FIND Take  2 capsules by mouth daily. Citrus & Pinellia Vitamin (Patient not taking: Reported on 05/23/2023)   Facility-Administered Encounter Medications as of 05/23/2023  Medication   0.9 %  sodium chloride infusion    Review of Systems:  Review of Systems  Constitutional:  Negative for activity change, appetite change and fever.  HENT:  Negative for sore throat.   Eyes: Negative.   Cardiovascular:  Negative for chest pain and leg swelling.  Gastrointestinal:  Negative for abdominal distention, diarrhea and vomiting.  Genitourinary:  Negative for dysuria, frequency and urgency.  Skin:  Negative for color change.  Neurological:  Positive for dizziness. Negative for headaches.  Psychiatric/Behavioral:  Negative for behavioral problems and sleep disturbance. The patient is not nervous/anxious.     Health Maintenance  Topic Date Due   INFLUENZA VACCINE  09/26/2023   Medicare Annual Wellness (AWV)  10/07/2023   Colonoscopy  04/04/2024   DTaP/Tdap/Td (2 - Td or Tdap) 01/02/2026   Pneumonia Vaccine 14+ Years old  Completed   Hepatitis C Screening  Completed   Zoster Vaccines- Shingrix  Completed   HPV VACCINES  Aged Out   COVID-19 Vaccine   Discontinued    Physical Exam: Vitals:   05/23/23 1450  BP: 114/60  Pulse: 70  Resp: (!) 21  Temp: 98.7 F (37.1 C)  SpO2: 96%  Weight: 166 lb 6.4 oz (75.5 kg)  Height: 5\' 6"  (1.676 m)   Body mass index is 26.86 kg/m. Physical Exam Constitutional:      General: He is not in acute distress.    Appearance: Normal appearance.  HENT:     Head: Normocephalic and atraumatic.     Mouth/Throat:     Mouth: Mucous membranes are moist.  Eyes:     Conjunctiva/sclera: Conjunctivae normal.  Cardiovascular:     Rate and Rhythm: Normal rate and regular rhythm.     Pulses: Normal pulses.     Heart sounds: Normal heart sounds.  Pulmonary:     Effort: Pulmonary effort is normal.     Breath sounds: Normal breath sounds.  Abdominal:     General: Bowel sounds are normal.     Palpations: Abdomen is soft.  Musculoskeletal:        General: No swelling. Normal range of motion.     Cervical back: Normal range of motion.     Comments: Uses cane when walking  Skin:    General: Skin is warm and dry.  Neurological:     General: No focal deficit present.     Mental Status: He is alert and oriented to person, place, and time.  Psychiatric:        Mood and Affect: Mood normal.        Behavior: Behavior normal.        Thought Content: Thought content normal.        Judgment: Judgment normal.     Labs reviewed: Basic Metabolic Panel: Recent Labs    11/18/22 1050 01/21/23 1353 05/23/23 1608  NA 142 140 132*  K 3.6 3.4* 3.7  CL 98 94* 90*  CO2 32 30 30  GLUCOSE 88 93 102  BUN 13 16 22   CREATININE 0.98 0.96 1.03  CALCIUM 9.3 9.2 9.4   Liver Function Tests: Recent Labs    06/17/22 1346 11/18/22 1050  AST 30 46*  ALT 19 29  BILITOT 1.1 0.7  PROT 6.8 7.3   Recent Labs    06/17/22 1346  LIPASE 14  AMYLASE 26  No results for input(s): "AMMONIA" in the last 8760 hours. CBC: Recent Labs    11/18/22 1050 01/21/23 1353 05/23/23 1608  WBC 5.0 5.6 4.5  NEUTROABS 3,730  4,502 3,501  HGB 13.3 12.9* 11.9*  HCT 39.3 36.0* 33.1*  MCV 98.7 97.3 99.4  PLT 186 155 105*   Lipid Panel: No results for input(s): "CHOL", "HDL", "LDLCALC", "TRIG", "CHOLHDL", "LDLDIRECT" in the last 8760 hours. No results found for: "HGBA1C"  Procedures since last visit: No results found.  Assessment/Plan  1. Dizzy spells (Primary) -  Chronic dizziness likely due to orthostatic hypotension from low blood pressure and medication side effects. Recent flu-like symptoms may contribute. - Order metabolic panel to check electrolytes. - Advise use of compression socks during the day. - Adjust hydrochlorothiazide to as needed for swelling. - Reduce gabapentin to 200 mg at night and 100 mg in the morning - Basic Metabolic Panel with eGFR - CBC with Differential/Platelets  2. Neuropathy -   - gabapentin (NEURONTIN) 100 MG capsule; Take 1 capsule (100 mg total) by mouth daily.  Dispense: 30 capsule; Refill: 3 - gabapentin (NEURONTIN) 100 MG capsule; Take 2 capsules (200 mg total) by mouth at bedtime.  3. Primary hypertension -  Chronic peripheral neuropathy managed with gabapentin. Current dosage may contribute to dizziness and memory issues. - Reduce gabapentin to 200 mg at night and 100 mg in the morning.  4. Anxiety -  Intermittent anxiety managed with alprazolam. - Continue alprazolam as needed.  5. Lower extremity edema - hydrochlorothiazide (HYDRODIURIL) 25 MG tablet; Take 1 tablet (25 mg total) by mouth daily as needed.  6. Tobacco abuse -  Smokes 10-11 cigarettes per day. Reducing smoking may improve health and reduce dizziness. - Advise reducing smoking to 5 cigarettes per day with goal of cessation.    Labs/tests ordered:   CBC and BMP   Return if symptoms worsen or fail to improve.  Kenard Gower, NP

## 2023-05-24 LAB — CBC WITH DIFFERENTIAL/PLATELET
Absolute Lymphocytes: 545 {cells}/uL — ABNORMAL LOW (ref 850–3900)
Absolute Monocytes: 351 {cells}/uL (ref 200–950)
Basophils Absolute: 32 {cells}/uL (ref 0–200)
Basophils Relative: 0.7 %
Eosinophils Absolute: 72 {cells}/uL (ref 15–500)
Eosinophils Relative: 1.6 %
HCT: 33.1 % — ABNORMAL LOW (ref 38.5–50.0)
Hemoglobin: 11.9 g/dL — ABNORMAL LOW (ref 13.2–17.1)
MCH: 35.7 pg — ABNORMAL HIGH (ref 27.0–33.0)
MCHC: 36 g/dL (ref 32.0–36.0)
MCV: 99.4 fL (ref 80.0–100.0)
MPV: 10 fL (ref 7.5–12.5)
Monocytes Relative: 7.8 %
Neutro Abs: 3501 {cells}/uL (ref 1500–7800)
Neutrophils Relative %: 77.8 %
Platelets: 105 10*3/uL — ABNORMAL LOW (ref 140–400)
RBC: 3.33 10*6/uL — ABNORMAL LOW (ref 4.20–5.80)
RDW: 11.8 % (ref 11.0–15.0)
Total Lymphocyte: 12.1 %
WBC: 4.5 10*3/uL (ref 3.8–10.8)

## 2023-05-24 LAB — BASIC METABOLIC PANEL WITHOUT GFR
BUN: 22 mg/dL (ref 7–25)
CO2: 30 mmol/L (ref 20–32)
Calcium: 9.4 mg/dL (ref 8.6–10.3)
Chloride: 90 mmol/L — ABNORMAL LOW (ref 98–110)
Creat: 1.03 mg/dL (ref 0.70–1.28)
Glucose, Bld: 102 mg/dL (ref 65–139)
Potassium: 3.7 mmol/L (ref 3.5–5.3)
Sodium: 132 mmol/L — ABNORMAL LOW (ref 135–146)

## 2023-05-29 ENCOUNTER — Telehealth: Payer: Self-pay

## 2023-05-29 ENCOUNTER — Other Ambulatory Visit: Payer: Self-pay | Admitting: Nurse Practitioner

## 2023-05-29 DIAGNOSIS — F5101 Primary insomnia: Secondary | ICD-10-CM

## 2023-05-29 MED ORDER — ALPRAZOLAM 1 MG PO TABS: ORAL_TABLET | ORAL | 0 refills | Status: DC

## 2023-05-29 NOTE — Telephone Encounter (Signed)
 Copied from CRM [id number]. Topic: Clinical - Medication Refill >> May 29, 2023  2:16 PM Corin V wrote: Most Recent Primary Care Visit:  Provider: Kenard Gower C  Department: [facility]  Visit Type: ACUTE  Date: 05/23/2023  Medication: ALPRAZolam Prudy Feeler) 1 MG tablet  Has the patient contacted their pharmacy? Yes (Agent: If no, request that the patient contact the pharmacy for the refill. If patient does not wish to contact the pharmacy document the reason why and proceed with request.) (Agent: If yes, when and what did the pharmacy advise?)  Is this the correct pharmacy for this prescription? Yes If no, delete pharmacy and type the correct one.  This is the patient's preferred pharmacy:  [id number] - [place], [place] - [address] [place] [address] Phone: [phone] Fax: [phone]  Has the prescription been filled recently? No  Is the patient out of the medication? No  Has the patient been seen for an appointment in the last year OR does the patient have an upcoming appointment? Yes  Can we respond through MyChart? No  Agent: Please be advised that Rx refills may take up to 3 business days. We ask that you follow-up with your pharmacy.

## 2023-05-29 NOTE — Telephone Encounter (Signed)
 It was stopped because he reported he was not taking- is he taking this medication?

## 2023-05-29 NOTE — Telephone Encounter (Signed)
 Copied from CRM [id number]. Topic: Clinical - Medication Question >> May 29, 2023  2:18 PM Corin V wrote: Reason for CRM: Patient stated that he was prescribed pantoprazole (PROTONIX) 40 MG tablet at his visit on 05/23/23 but it shows this was discontinued in his medication records. Please advise patient if he is to continue or stop taking this medication and if it can be taken with the other medications he is on.    Message sent to Sharon Seller, NP

## 2023-05-29 NOTE — Telephone Encounter (Signed)
 Rx sent to pharmacy but needs to set up follow up with me, he saw [facility] for acute last month, have him set up routine follow up with me in the next month or 2

## 2023-05-29 NOTE — Telephone Encounter (Signed)
 Patient has request refill on medication Xanax. Patient medication last refilled 01/08/2023. Patient has no contract on file. Upcoming appointment 10/14/2023. Appointment notes updated. Medication pend and sent to PCP Janyth Contes Janene Harvey, NP

## 2023-05-30 NOTE — Telephone Encounter (Signed)
 Message routed to Admin staff to set patient up an appointment.

## 2023-06-01 NOTE — Progress Notes (Signed)
-    hgb 11.9, stable, down from 12.9 (taken 4 months ago -  platelet 105, down from 155, will need to be re-checked in next visit -  sodium 132, down from 140, possibly due to hydrochlorothiazide which is now changed to as needed -  no new order

## 2023-06-30 ENCOUNTER — Ambulatory Visit
Admission: EM | Admit: 2023-06-30 | Discharge: 2023-06-30 | Disposition: A | Discharge status: Home / Self Care | Attending: Nurse Practitioner | Admitting: Nurse Practitioner

## 2023-06-30 DIAGNOSIS — J069 Acute upper respiratory infection, unspecified: Secondary | ICD-10-CM

## 2023-06-30 DIAGNOSIS — R112 Nausea with vomiting, unspecified: Secondary | ICD-10-CM | POA: Diagnosis not present

## 2023-06-30 DIAGNOSIS — R197 Diarrhea, unspecified: Secondary | ICD-10-CM

## 2023-06-30 LAB — POCT URINALYSIS DIP (MANUAL ENTRY)
Bilirubin, UA: NEGATIVE
Blood, UA: NEGATIVE
Glucose, UA: NEGATIVE mg/dL
Leukocytes, UA: NEGATIVE
Nitrite, UA: NEGATIVE
Protein Ur, POC: NEGATIVE mg/dL
Spec Grav, UA: <=1.005 — AB
Urobilinogen, UA: 0.2 U/dL
pH, UA: 6

## 2023-06-30 MED ORDER — AZITHROMYCIN 250 MG PO TABS: 250.0000 mg | ORAL_TABLET | Freq: Every day | ORAL | 0 refills | Status: DC

## 2023-06-30 MED ORDER — ONDANSETRON 4 MG PO TBDP: 4.0000 mg | ORAL_TABLET | Freq: Three times a day (TID) | ORAL | 0 refills | Status: DC | PRN

## 2023-06-30 MED ORDER — BENZONATATE 100 MG PO CAPS: 100.0000 mg | ORAL_CAPSULE | Freq: Three times a day (TID) | ORAL | 0 refills | Status: DC

## 2023-06-30 NOTE — ED Provider Notes (Signed)
 [facility]    CSN: [id number] Arrival date & time: 06/30/23  1355      History   Chief Complaint Chief Complaint  Patient presents with   Cough   Nasal Congestion    HPI Hunter Porter is a 78 y.o. male.   The history is provided by the patient.   Patient presents with a 1 week history of headache, nasal congestion, runny nose, cough, vomiting, and diarrhea.  Patient reports he has not had any episodes of nausea and vomiting or diarrhea today.  He further denies fever, chills, ear pain, ear drainage, wheezing, difficulty breathing, chest pain, or abdominal pain.  Reports he has been taking over-the-counter naproxen  for his symptoms.  Patient denies any obvious known sick contacts.  Past Medical History:  Diagnosis Date   Adenomatous colon polyp    Allergy    Anxiety    Arthritis    Cataract    removed bilateral   GERD (gastroesophageal reflux disease)    Graves disease    Per [facility] New Patient Packet   H/O hemorrhoids    H/O hiatal hernia    Heart murmur 2007   per pt history- possible murmur by [facility]- states negative stress test   Hyperlipidemia    Pneumonia    dx 03/08/21   Prostate cancer (HCC) 01/20/2013   Sleep apnea    CPAP.  Trying to use it. (NOT USING CURRENTLY )   DX YEARS AGO   Thyroid disease    graves   Ulcer approx 1970   HX peptic ulcer    Patient Active Problem List   Diagnosis Date Noted   History of prostate cancer 03/20/2021   Primary osteoarthritis involving multiple joints 03/20/2021   B12 deficiency 03/20/2021   Vitamin D  deficiency 03/20/2021   Primary osteoarthritis of left knee 09/23/2016   Abnormal EKG 08/31/2016   Tobacco abuse 08/31/2016   Dyslipidemia 08/31/2016   Preop cardiovascular exam 08/31/2016   OSA on CPAP 08/21/2016   Ventral hernia 08/21/2016   Hypertension 05/29/2016   Lower extremity edema 05/15/2016   Insomnia 06/01/2015   GERD (gastroesophageal reflux disease)    Anxiety    Thyroid disease      Past Surgical History:  Procedure Laterality Date   CATARACT EXTRACTION W/ INTRAOCULAR LENS  IMPLANT, BILATERAL     COLONOSCOPY  2017   LYMPHADENECTOMY Bilateral 03/17/2013   Procedure: LYMPHADENECTOMY PELVIC LYMPH NODE DISSECTION;  Surgeon: Livingston Rigg, MD;  Location: [facility];  Service: Urology;  Laterality: Bilateral;   POLYPECTOMY     PROSTATE BIOPSY     ROBOT ASSISTED LAPAROSCOPIC RADICAL PROSTATECTOMY N/A 03/17/2013   Procedure: ROBOTIC ASSISTED LAPAROSCOPIC RADICAL PROSTATECTOMY;  Surgeon: Livingston Rigg, MD;  Location: [facility];  Service: Urology;  Laterality: N/A;   TONSILLECTOMY     TOTAL KNEE ARTHROPLASTY Left 10/15/2016   Procedure: TOTAL KNEE ARTHROPLASTY;  Surgeon: Saundra Curl, MD;  Location: [facility];  Service: Orthopedics;  Laterality: Left;   TRANSURETHRAL RESECTION OF PROSTATE  12/01/2012   Dr Bosie Bye   UPPER GASTROINTESTINAL ENDOSCOPY         Home Medications    Prior to Admission medications   Medication Sig Start Date End Date Taking? Authorizing Provider  albuterol  (VENTOLIN  HFA) 108 (90 Base) MCG/ACT inhaler Inhale 2 puffs into the lungs every 6 (six) hours as needed for wheezing or shortness of breath. 11/18/22   Verma Gobble, NP  ALPRAZolam  (XANAX ) 1 MG tablet TAKE 1/2 TABLET  BY MOUTH DAILY AS NEEDED 05/29/23  Yes Eubanks, Jessica K, NP  cetirizine (ZYRTEC) 10 MG tablet Take 10 mg by mouth daily.   Yes [provider]  cholecalciferol (VITAMIN D ) 1000 units tablet Take 1,000 Units by mouth daily.   Yes [provider]  diclofenac Sodium (VOLTAREN) 1 % GEL APPLY 4 GRAMS TO AFFECTED AREA THREE TIMES A DAY AS NEEDED FOR L KNEE PAIN 09/06/20   [provider]  EPINEPHrine  0.3 mg/0.3 mL IJ SOAJ injection Inject 0.3 mLs (0.3 mg total) into the muscle once. For allergic reaction to bee stings 11/02/13   Mathis Som, MD  fluticasone  (FLONASE ) 50 MCG/ACT nasal spray Place 2 sprays into both nostrils daily. 01/21/23  Yes Tye Gall, MD  gabapentin  (NEURONTIN ) 100 MG capsule Take 1 capsule (100 mg total) by mouth daily. 05/23/23   Medina-Vargas, Monina C, NP  gabapentin  (NEURONTIN ) 100 MG capsule Take 2 capsules (200 mg total) by mouth at bedtime. 05/23/23   Medina-Vargas, Monina C, NP  hydrochlorothiazide  (HYDRODIURIL ) 25 MG tablet Take 1 tablet (25 mg total) by mouth daily as needed. 05/23/23   Medina-Vargas, Monina C, NP  ondansetron  (ZOFRAN -ODT) 4 MG disintegrating tablet Take 1 tablet (4 mg total) by mouth every 8 (eight) hours as needed for nausea or vomiting. 04/19/23  Yes Corbin Dess, PA-C  potassium chloride  SA (KLOR-CON  M) 20 MEQ tablet Take 2 tablets (40 mEq total) by mouth once for 1 dose. 01/22/23 01/22/23  Tye Gall, MD  vitamin B-12 (CYANOCOBALAMIN ) 1000 MCG tablet Take 1,000 mcg by mouth daily.   Yes [provider]  vitamin E 100 UNIT capsule Take by mouth daily.   Yes [provider]    Family History Family History  Problem Relation Age of Onset   Cancer Mother        brain   Cancer Brother        Question lung   Drug abuse Maternal Aunt    Colon polyps Neg Hx    Colon cancer Neg Hx    Rectal cancer Neg Hx    Stomach cancer Neg Hx    Esophageal cancer Neg Hx     Social History Social History   Tobacco Use   Smoking status: Every Day    Current packs/day: 0.50    Types: Cigarettes   Smokeless tobacco: Never  Vaping Use   Vaping status: Never Used  Substance Use Topics   Alcohol  use: Yes    Comment: occassion   Drug use: No     Allergies   Bee venom, Penicillins, and Hydrocodone-acetaminophen    Review of Systems Review of Systems  Respiratory:  Positive for cough.      Physical Exam Triage Vital Signs ED Triage Vitals  Encounter Vitals Group     BP 06/30/23 1611 (!) 119/55     Systolic BP Percentile --      Diastolic BP Percentile --      Pulse Rate 06/30/23 1611 88     Resp 06/30/23 1611 18     Temp 06/30/23 1611 98.8  F (37.1 C)     Temp Source 06/30/23 1611 Oral     SpO2 06/30/23 1611 95 %     Weight --      Height --      Head Circumference --      Peak Flow --      Pain Score 06/30/23 1614 0     Pain Loc --  Pain Education --      Exclude from Growth Chart --    No data found.  Updated Vital Signs BP (!) 119/55 (BP Location: Right Arm)   Pulse 88   Temp 98.8 F (37.1 C) (Oral)   Resp 18   SpO2 95%   Visual Acuity Right Eye Distance:   Left Eye Distance:   Bilateral Distance:    Right Eye Near:   Left Eye Near:    Bilateral Near:     Physical Exam Vitals and nursing note reviewed.  Constitutional:      General: He is not in acute distress.    Appearance: Normal appearance.  HENT:     Head: Normocephalic.     Right Ear: Tympanic membrane, ear canal and external ear normal.     Left Ear: Tympanic membrane, ear canal and external ear normal.     Nose: Nose normal.     Right Turbinates: Enlarged and swollen.     Left Turbinates: Enlarged and swollen.     Right Sinus: No maxillary sinus tenderness or frontal sinus tenderness.     Left Sinus: No maxillary sinus tenderness or frontal sinus tenderness.     Mouth/Throat:     Lips: Pink.     Mouth: Mucous membranes are moist.     Pharynx: Uvula midline. Postnasal drip present. No pharyngeal swelling, oropharyngeal exudate, posterior oropharyngeal erythema or uvula swelling.     Comments: Cobblestoning present to posterior oropharynx  Eyes:     Extraocular Movements: Extraocular movements intact.     Conjunctiva/sclera: Conjunctivae normal.     Pupils: Pupils are equal, round, and reactive to light.  Cardiovascular:     Rate and Rhythm: Normal rate and regular rhythm.     Pulses: Normal pulses.     Heart sounds: Normal heart sounds.  Pulmonary:     Effort: Pulmonary effort is normal. No respiratory distress.     Breath sounds: Normal breath sounds. No stridor. No wheezing, rhonchi or rales.  Abdominal:     General: Bowel  sounds are normal.     Palpations: Abdomen is soft.     Tenderness: There is no abdominal tenderness.  Musculoskeletal:     Cervical back: Normal range of motion.  Skin:    General: Skin is warm and dry.  Neurological:     General: No focal deficit present.     Mental Status: He is alert and oriented to person, place, and time.  Psychiatric:        Mood and Affect: Mood normal.        Behavior: Behavior normal.      UC Treatments / Results  Labs (all labs ordered are listed, but only abnormal results are displayed) Labs Reviewed  POCT URINALYSIS DIP (MANUAL ENTRY) - Abnormal; Notable for the following components:      Result Value   Ketones, POC UA small (15) (*)    Spec Grav, UA <=1.005 (*)    All other components within normal limits    EKG   Radiology No results found.  Procedures Procedures (including critical care time)  Medications Ordered in UC Medications - No data to display  Initial Impression / Assessment and Plan / UC Course  I have reviewed the triage vital signs and the nursing notes.  Pertinent labs & imaging results that were available during my care of the patient were reviewed by me and considered in my medical decision making (see chart for details).  On exam,  lung sounds are clear throughout, room air sats at 95%.  Patient has been afebrile since symptoms started approximately 1 week ago.  He does not have any mucopurulent drainage, or sinus pressure or tenderness.  Symptoms consistent with a viral URI with cough.  Will provide symptomatic treatment with Tessalon  100 mg.  Ondansetron  4 mg ODT prescribed for nausea and vomiting.  Patient advised to begin use of fluticasone  that he currently has at home.  Will send prescription for azithromycin  250 mg in the event patient's symptoms do not improve within the next 5 days.  Supportive care recommendations were provided and discussed with the patient to include fluids, rest, continuing over-the-counter  analgesics such as Tylenol , normal saline nasal spray, and use of a humidifier during sleep.  Discussed indications regarding follow-up.  Patient was in agreement with this plan of care and verbalizes understanding.  All questions were answered.  Patient stable for discharge.  Final Clinical Impressions(s) / UC Diagnoses   Final diagnoses:  None   Discharge Instructions   None    ED Prescriptions   None    PDMP not reviewed this encounter.   Hardy Lia, NP 06/30/23 [phone]

## 2023-06-30 NOTE — Discharge Instructions (Addendum)
 Take medication as prescribed.  As discussed, start the Flonase  that you currently have at home.  If symptoms have not improved by 07/04/2023, start the antibiotic prescribed today. Increase fluids and allow for plenty of rest. You may take over-the-counter Tylenol  as needed for pain, fever, or general discomfort. Recommend use of normal saline nasal spray throughout the day for nasal congestion and runny nose. For your cough, recommend use of a humidifier in your bedroom at nighttime during sleep and sleeping elevated on pillows while symptoms persist. Increase the fruits and vegetables in your diet to help decrease diarrhea symptoms.  Recommend a brat diet to include bananas, rice, applesauce, and toast until nausea and vomiting improved.  If symptoms do not improve with this treatment, please follow-up with your primary care physician for further evaluation. Follow-up as needed.

## 2023-06-30 NOTE — ED Notes (Signed)
 Pt came in clinic 15 mins after phone calls were made patient states he did not receive the calls from inside the clinic at the time we called his phone

## 2023-06-30 NOTE — ED Triage Notes (Signed)
 Cough, congestion, vomiting, headache, diarrhea x 1 week. Taking naproxen .

## 2023-06-30 NOTE — ED Notes (Signed)
 Attempted to call patient twice via number provided by patient access and once in the lobby no response for any of the calls.

## 2023-07-11 ENCOUNTER — Encounter: Admitting: Nurse Practitioner

## 2023-07-11 NOTE — Progress Notes (Signed)
 This encounter was created in error - please disregard.

## 2023-09-12 ENCOUNTER — Other Ambulatory Visit: Payer: Self-pay | Admitting: Nurse Practitioner

## 2023-09-16 ENCOUNTER — Telehealth

## 2023-09-16 NOTE — Telephone Encounter (Signed)
 Copied from CRM [id number]. Topic: Clinical - Prescription Issue >> Sep 16, 2023 10:49 AM Rosaria A wrote: Reason for CRM: Patient is calling to see why his medication is not being refilled. He spoke with the pharmacy and was told to call his PCP. Medication: mupirocin  ointment (BACTROBAN ) 2 %  Showing: refill not appropriate in refill request. Please advise and reach out to patient.  [facility] #[id number] - [place], [place] - [address]  Phone: [phone] Fax: [phone]

## 2023-09-16 NOTE — Telephone Encounter (Signed)
 Call returned to patient and/or family member, a detailed message was left with the providers reply. Patient will need to schedule an appointment for a routine visit and this can be scheduled with any provider

## 2023-09-16 NOTE — Telephone Encounter (Signed)
 Overdue for follow up, please call and schedule routine follow up to discuss.

## 2023-09-16 NOTE — Telephone Encounter (Signed)
 The requested medication is not on active medication list. Spoke with patient and he states that he is and has always used bactroban  ointment and does not understand why it was removed from his list.  Use for medication: Patient with rash off/on on right foot  Please advise and add medication back to list if necessary, we can then provide a refill

## 2023-09-19 ENCOUNTER — Telehealth: Payer: Self-pay

## 2023-09-19 NOTE — Telephone Encounter (Signed)
 Outgoing call placed to patient to inform him of Dinah's response

## 2023-09-19 NOTE — Telephone Encounter (Signed)
 Please refer to previous phone note dated 09/16/23  I have now left another very detailed voicemail informing patient Hunter Harlene POUR, NP refused to refill Bactroban , stating he is OVERDUE for a routine follow-up. Appointment is pending for 09/29/23.   I will forward to another provider to see if they are willing to add medication back to med list and approve since appointment is pending

## 2023-09-19 NOTE — Telephone Encounter (Signed)
 Copied from CRM [id number]. Topic: Clinical - Prescription Issue >> Sep 16, 2023 10:49 AM Rosaria A wrote: Reason for CRM: Patient is calling to see why his medication is not being refilled. He spoke with the pharmacy and was told to call his PCP. Medication: mupirocin  ointment (BACTROBAN ) 2 %  Showing: refill not appropriate in refill request. Please advise and reach out to patient.  [facility] #[id number] - [place], [place] - [address]  Phone: [phone] Fax: [phone] >> Sep 19, 2023 10:49 AM Zane F wrote: Patient is scheduled to see his PCP for a follow up appointment as requested by his provider on September 29, 2023 at 10:20am.

## 2023-09-19 NOTE — Telephone Encounter (Signed)
 Discuss Bactroban  use with PCP during upcoming appointment.

## 2023-09-29 ENCOUNTER — Ambulatory Visit (INDEPENDENT_AMBULATORY_CARE_PROVIDER_SITE_OTHER): Admitting: Nurse Practitioner

## 2023-09-29 ENCOUNTER — Encounter: Payer: Self-pay | Admitting: Nurse Practitioner

## 2023-09-29 VITALS — BP 126/82 | HR 64 | Temp 98.0°F | Resp 10 | Ht 66.0 in | Wt 169.8 lb

## 2023-09-29 DIAGNOSIS — E785 Hyperlipidemia, unspecified: Secondary | ICD-10-CM | POA: Diagnosis not present

## 2023-09-29 DIAGNOSIS — L080 Pyoderma: Secondary | ICD-10-CM

## 2023-09-29 DIAGNOSIS — D649 Anemia, unspecified: Secondary | ICD-10-CM | POA: Diagnosis not present

## 2023-09-29 DIAGNOSIS — Z8546 Personal history of malignant neoplasm of prostate: Secondary | ICD-10-CM

## 2023-09-29 DIAGNOSIS — F419 Anxiety disorder, unspecified: Secondary | ICD-10-CM | POA: Diagnosis not present

## 2023-09-29 DIAGNOSIS — I10 Essential (primary) hypertension: Secondary | ICD-10-CM

## 2023-09-29 DIAGNOSIS — K219 Gastro-esophageal reflux disease without esophagitis: Secondary | ICD-10-CM | POA: Diagnosis not present

## 2023-09-29 DIAGNOSIS — F5101 Primary insomnia: Secondary | ICD-10-CM

## 2023-09-29 DIAGNOSIS — Z720 Tobacco use: Secondary | ICD-10-CM | POA: Diagnosis not present

## 2023-09-29 LAB — CBC WITH DIFFERENTIAL/PLATELET
Absolute Lymphocytes: 556 {cells}/uL — ABNORMAL LOW (ref 850–3900)
Absolute Monocytes: 368 {cells}/uL (ref 200–950)
Basophils Absolute: 40 {cells}/uL (ref 0–200)
Basophils Relative: 1 %
Eosinophils Absolute: 180 {cells}/uL (ref 15–500)
Eosinophils Relative: 4.5 %
HCT: 40 % (ref 38.5–50.0)
Hemoglobin: 13.3 g/dL (ref 13.2–17.1)
MCH: 34.6 pg — ABNORMAL HIGH (ref 27.0–33.0)
MCHC: 33.3 g/dL (ref 32.0–36.0)
MCV: 104.2 fL — ABNORMAL HIGH (ref 80.0–100.0)
MPV: 9.8 fL (ref 7.5–12.5)
Monocytes Relative: 9.2 %
Neutro Abs: 2856 {cells}/uL (ref 1500–7800)
Neutrophils Relative %: 71.4 %
Platelets: 205 Thousand/uL (ref 140–400)
RBC: 3.84 Million/uL — ABNORMAL LOW (ref 4.20–5.80)
RDW: 12.5 % (ref 11.0–15.0)
Total Lymphocyte: 13.9 %
WBC: 4 Thousand/uL (ref 3.8–10.8)

## 2023-09-29 LAB — LIPID PANEL
Cholesterol: 131 mg/dL (ref <200)
HDL: 74 mg/dL (ref >=40)
LDL Cholesterol (Calc): 42 mg/dL
Non-HDL Cholesterol (Calc): 57 mg/dL (ref <130)
Total CHOL/HDL Ratio: 1.8 (calc) (ref <5.0)
Triglycerides: 69 mg/dL (ref <150)

## 2023-09-29 LAB — COMPREHENSIVE METABOLIC PANEL WITH GFR
AG Ratio: 1.3 (calc) (ref 1.0–2.5)
ALT: 15 U/L (ref 9–46)
AST: 27 U/L (ref 10–35)
Albumin: 3.6 g/dL (ref 3.6–5.1)
Alkaline phosphatase (APISO): 118 U/L (ref 35–144)
BUN: 21 mg/dL (ref 7–25)
CO2: 29 mmol/L (ref 20–32)
Calcium: 8.9 mg/dL (ref 8.6–10.3)
Chloride: 103 mmol/L (ref 98–110)
Creat: 0.84 mg/dL (ref 0.70–1.28)
Globulin: 2.8 g/dL (ref 1.9–3.7)
Glucose, Bld: 84 mg/dL (ref 65–99)
Potassium: 4.1 mmol/L (ref 3.5–5.3)
Sodium: 141 mmol/L (ref 135–146)
Total Bilirubin: 0.5 mg/dL (ref 0.2–1.2)
Total Protein: 6.4 g/dL (ref 6.1–8.1)
eGFR: 90 mL/min/1.73m2 (ref >=60)

## 2023-09-29 LAB — IRON,TIBC AND FERRITIN PANEL
%SAT: 31 % (ref 20–48)
Ferritin: 161 ng/mL (ref 24–380)
Iron: 89 ug/dL (ref 50–180)
TIBC: 287 ug/dL (ref 250–425)

## 2023-09-29 LAB — PSA: PSA: 0.04 ng/mL (ref <=4.00)

## 2023-09-29 MED ORDER — ALPRAZOLAM 1 MG PO TABS: 0.5000 mg | ORAL_TABLET | Freq: Every day | ORAL | 0 refills | Status: AC | PRN

## 2023-09-29 MED ORDER — MUPIROCIN 2 % EX OINT: 1.0000 | TOPICAL_OINTMENT | Freq: Two times a day (BID) | CUTANEOUS | 1 refills | Status: AC

## 2023-09-29 NOTE — Patient Instructions (Signed)
 Zoloft  25 mg daily for anxiety- see if you were on this before  Another medication is lexapro

## 2023-09-29 NOTE — Progress Notes (Signed)
 "   Careteam: Patient Care Team: Caro Harlene POUR, NP as PCP - General (Geriatric Medicine) Devere Lonni Righter, MD as Consulting Physician (Urology)  PLACE OF SERVICE:  [facility]  Advanced Directive information    Allergies  Allergen Reactions   Bee Venom Swelling    Throat swells    Penicillins Rash and Other (See Comments)    PATIENT HAS HAD A PCN REACTION WITH IMMEDIATE RASH, FACIAL/TONGUE/THROAT SWELLING, SOB, OR LIGHTHEADEDNESS WITH HYPOTENSION:  #  #  #  YES  #  #  #   Has patient had a PCN reaction causing severe rash involving mucus membranes or skin necrosis: No Has patient had a PCN reaction that required hospitalization: No Has patient had a PCN reaction occurring within the last 10 years: No If all of the above answers are NO, then may proceed with Cephalosporin use.    Hydrocodone-Acetaminophen  Nausea And Vomiting    Chief Complaint  Patient presents with   Medical Management of Chronic Issues    Routine follow up // pt has rash on right foot/ lab results review // medication review.     HPI:  Discussed the use of AI scribe software for clinical note transcription with the patient, who gave verbal consent to proceed.  History of Present Illness Hunter Porter is a 78 year old male who presents for a six-month follow-up.  He has a history of prostate cancer and requests a PSA check. He is not experiencing any urinary symptoms.  He experiences a recurrent pustular rash on his foot, has been ongoing for years. Saw dermatologist and previous medications were ineffective, but mupirocin  has been effective in managing the rash, which recurs approximately once a month. He has been out of mupirocin  and requests a refill.  He has a history of dizziness and body aches, previously addressed by another provider. He stopped taking gabapentin  due to side effects, including dizziness, and has noticed improvement in symptoms since discontinuation.   He is  currently taking Protonix  twice daily for acid reflux. No history of bleeding ulcers. No dark, tarry stools, signs of blood loss, or current nausea.  He has a history of elevated cholesterol but is not currently on medication for it and has not had recent blood work for cholesterol levels.  He takes Xanax  infrequently, approximately a quarter of a tablet as needed for anxiety, which he attributes to concerns about his son, a veterinary surgeon. He has a history of using Xanax  or similar medications for many years without feeling dependent. He has reduced his alcohol  consumption, now drinking about half a bottle of wine or one to two bourbons, and has cut back on smoking to about ten to twelve cigarettes a day.   Review of Systems:  Review of Systems  Constitutional:  Negative for chills, fever and weight loss.  HENT:  Negative for tinnitus.   Respiratory:  Negative for cough, sputum production and shortness of breath.   Cardiovascular:  Negative for chest pain, palpitations and leg swelling.  Gastrointestinal:  Negative for abdominal pain, constipation, diarrhea and heartburn.  Genitourinary:  Negative for dysuria, frequency and urgency.  Musculoskeletal:  Negative for back pain, falls, joint pain and myalgias.  Skin:  Positive for rash.  Neurological:  Negative for dizziness and headaches.  Psychiatric/Behavioral:  Negative for depression and memory loss. The patient is nervous/anxious. The patient does not have insomnia.     Past Medical History:  Diagnosis Date   Adenomatous colon polyp  Allergy    Anxiety    Arthritis    Cataract    removed bilateral   GERD (gastroesophageal reflux disease)    Graves disease    Per [facility] New Patient Packet   H/O hemorrhoids    H/O hiatal hernia    Heart murmur 2007   per pt history- possible murmur by PC[facility]- states negative stress test   Hyperlipidemia    Pneumonia    dx 03/08/21   Prostate cancer (HCC) 01/20/2013   Sleep apnea    CPAP.  Trying to use  it. (NOT USING CURRENTLY )   DX YEARS AGO   Thyroid disease    graves   Ulcer approx 1970   HX peptic ulcer   Past Surgical History:  Procedure Laterality Date   CATARACT EXTRACTION W/ INTRAOCULAR LENS  IMPLANT, BILATERAL     COLONOSCOPY  2017   LYMPHADENECTOMY Bilateral 03/17/2013   Procedure: LYMPHADENECTOMY PELVIC LYMPH NODE DISSECTION;  Surgeon: Alm GORMAN Fragmin, MD;  Location: [facility] ORS;  Service: Urology;  Laterality: Bilateral;   POLYPECTOMY     PROSTATE BIOPSY     ROBOT ASSISTED LAPAROSCOPIC RADICAL PROSTATECTOMY N/A 03/17/2013   Procedure: ROBOTIC ASSISTED LAPAROSCOPIC RADICAL PROSTATECTOMY;  Surgeon: Alm GORMAN Fragmin, MD;  Location: [facility];  Service: Urology;  Laterality: N/A;   TONSILLECTOMY     TOTAL KNEE ARTHROPLASTY Left 10/15/2016   Procedure: TOTAL KNEE ARTHROPLASTY;  Surgeon: Beverley Evalene BIRCH, MD;  Location: [facility] OR;  Service: Orthopedics;  Laterality: Left;   TRANSURETHRAL RESECTION OF PROSTATE  12/01/2012   Dr Fragmin   UPPER GASTROINTESTINAL ENDOSCOPY     Social History:   reports that he has been smoking cigarettes. He has never used smokeless tobacco. He reports current alcohol  use. He reports that he does not use drugs.  Family History  Problem Relation Age of Onset   Cancer Mother        brain   Cancer Brother        Question lung   Drug abuse Maternal Aunt    Colon polyps Neg Hx    Colon cancer Neg Hx    Rectal cancer Neg Hx    Stomach cancer Neg Hx    Esophageal cancer Neg Hx     Medications: Patient's Medications  New Prescriptions   No medications on file  Previous Medications   ALBUTEROL  (VENTOLIN  HFA) 108 (90 BASE) MCG/ACT INHALER    Inhale 2 puffs into the lungs every 6 (six) hours as needed for wheezing or shortness of breath.   ALPRAZOLAM  (XANAX ) 1 MG TABLET    TAKE 1/2 TABLET BY MOUTH DAILY AS NEEDED   AZITHROMYCIN  (ZITHROMAX ) 250 MG TABLET    Take 1 tablet (250 mg total) by mouth daily. Take first 2 tablets together, then 1 every day until  finished. Start prescription on 07/04/23.   BENZONATATE  (TESSALON ) 100 MG CAPSULE    Take 1 capsule (100 mg total) by mouth every 8 (eight) hours.   CETIRIZINE (ZYRTEC) 10 MG TABLET    Take 10 mg by mouth daily.   CHOLECALCIFEROL (VITAMIN D ) 1000 UNITS TABLET    Take 1,000 Units by mouth daily.   DICLOFENAC SODIUM (VOLTAREN) 1 % GEL    APPLY 4 GRAMS TO AFFECTED AREA THREE TIMES A DAY AS NEEDED FOR L KNEE PAIN   EPINEPHRINE  0.3 MG/0.3 ML IJ SOAJ INJECTION    Inject 0.3 mLs (0.3 mg total) into the muscle once. For allergic reaction to bee stings   FLUTICASONE  (  FLONASE ) 50 MCG/ACT NASAL SPRAY    Place 2 sprays into both nostrils daily.   GABAPENTIN  (NEURONTIN ) 100 MG CAPSULE    Take 1 capsule (100 mg total) by mouth daily.   GABAPENTIN  (NEURONTIN ) 100 MG CAPSULE    Take 2 capsules (200 mg total) by mouth at bedtime.   HYDROCHLOROTHIAZIDE  (HYDRODIURIL ) 25 MG TABLET    Take 1 tablet (25 mg total) by mouth daily as needed.   MUPIROCIN  OINTMENT (BACTROBAN ) 2 %    Apply 1 Application topically 2 (two) times daily.   ONDANSETRON  (ZOFRAN -ODT) 4 MG DISINTEGRATING TABLET    Take 1 tablet (4 mg total) by mouth every 8 (eight) hours as needed.   PANTOPRAZOLE  (PROTONIX ) 40 MG TABLET    Take 40 mg by mouth 2 (two) times daily.   POTASSIUM CHLORIDE  SA (KLOR-CON  M) 20 MEQ TABLET    Take 2 tablets (40 mEq total) by mouth once for 1 dose.   VITAMIN B-12 (CYANOCOBALAMIN ) 1000 MCG TABLET    Take 1,000 mcg by mouth daily.   VITAMIN E 100 UNIT CAPSULE    Take by mouth daily.  Modified Medications   No medications on file  Discontinued Medications   No medications on file    Physical Exam:  Vitals:   09/29/23 1140  BP: 126/82  Pulse: 64  Resp: 10  Temp: 98 F (36.7 C)  SpO2: 95%  Weight: 169 lb 12.8 oz (77 kg)  Height: 5' 6 (1.676 m)   Body mass index is 27.41 kg/m. Wt Readings from Last 3 Encounters:  09/29/23 169 lb 12.8 oz (77 kg)  05/23/23 166 lb 6.4 oz (75.5 kg)  01/21/23 167 lb (75.8 kg)     Physical Exam Constitutional:      General: He is not in acute distress.    Appearance: He is well-developed. He is not diaphoretic.  HENT:     Head: Normocephalic and atraumatic.     Right Ear: External ear normal.     Left Ear: External ear normal.     Mouth/Throat:     Pharynx: No oropharyngeal exudate.  Eyes:     Conjunctiva/sclera: Conjunctivae normal.     Pupils: Pupils are equal, round, and reactive to light.  Cardiovascular:     Rate and Rhythm: Normal rate and regular rhythm.     Heart sounds: Normal heart sounds.  Pulmonary:     Effort: Pulmonary effort is normal.     Breath sounds: Normal breath sounds.  Abdominal:     General: Bowel sounds are normal.     Palpations: Abdomen is soft.  Musculoskeletal:        General: No tenderness.     Cervical back: Normal range of motion and neck supple.     Right lower leg: No edema.     Left lower leg: No edema.  Skin:    General: Skin is warm and dry.     Findings: Rash present. Rash is pustular.     Comments: Small scattered pustular rash to medial aspect of right foot.  No erythema or swelling noted.   Neurological:     Mental Status: He is alert and oriented to person, place, and time.     Labs reviewed: Basic Metabolic Panel: Recent Labs    11/18/22 1050 01/21/23 1353 05/23/23 1608  NA 142 140 132*  K 3.6 3.4* 3.7  CL 98 94* 90*  CO2 32 30 30  GLUCOSE 88 93 102  BUN 13 16 22  CREATININE 0.98 0.96 1.03  CALCIUM 9.3 9.2 9.4   Liver Function Tests: Recent Labs    11/18/22 1050  AST 46*  ALT 29  BILITOT 0.7  PROT 7.3   No results for input(s): LIPASE, AMYLASE in the last 8760 hours. No results for input(s): AMMONIA in the last 8760 hours. CBC: Recent Labs    11/18/22 1050 01/21/23 1353 05/23/23 1608  WBC 5.0 5.6 4.5  NEUTROABS 3,730 4,502 3,501  HGB 13.3 12.9* 11.9*  HCT 39.3 36.0* 33.1*  MCV 98.7 97.3 99.4  PLT 186 155 105*   Lipid Panel: No results for input(s): CHOL,  HDL, LDLCALC, TRIG, CHOLHDL, LDLDIRECT in the last 8760 hours. TSH: No results for input(s): TSH in the last 8760 hours. A1C: No results found for: HGBA1C   Assessment/Plan  Primary hypertension Assessment & Plan: Blood pressure well controlled, goal bp <140/90 Continue current medications and dietary modifications follow metabolic panel   Orders: -     CBC with Differential/Platelet -     Comprehensive metabolic panel with GFR  Primary insomnia Assessment & Plan: Ongoing, continue lifestyle modifications   Orders: -     ALPRAZolam ; Take 0.5 tablets (0.5 mg total) by mouth daily as needed for anxiety. TAKE 1/2 TABLET BY MOUTH DAILY AS NEEDED  Dispense: 15 tablet; Refill: 0  Gastroesophageal reflux disease, unspecified whether esophagitis present Assessment & Plan: Stable, continue on protonix .    History of prostate cancer Assessment & Plan: No new symptoms, monitoring PSA  Orders: -     PSA  Dyslipidemia Assessment & Plan: Continue dietary modification, and follow lipids   Orders: -     Lipid panel  Anemia, unspecified type Assessment & Plan: No signs of blood loss, monitor CBC  Orders: -     CBC with Differential/Platelet -     Iron, TIBC and Ferritin Panel  Pustular rash Assessment & Plan: Ongoing, mupirocin  ointment has been effective, will refill.   Orders: -     Mupirocin ; Apply 1 Application topically 2 (two) times daily.  Dispense: 22 g; Refill: 1  Tobacco abuse Assessment & Plan: Encouraged cessation    Anxiety Assessment & Plan: Ongoing anxiety, drinking alcohol  to help cope with anxiety, discussed adverse effects of ETOH and frequent xanax  use.  Discussed safer options with SSRI, he said he will look to see what he has used in the past and let us  know.  Encouraged decreasing ETOH use.     Return in about 6 months (around 03/31/2024) for routine follow up.  Hunter Porter [facility] [phone]  "

## 2023-09-30 ENCOUNTER — Telehealth: Payer: Self-pay

## 2023-09-30 ENCOUNTER — Ambulatory Visit: Payer: Self-pay | Admitting: Nurse Practitioner

## 2023-09-30 MED ORDER — SERTRALINE HCL 50 MG PO TABS: 50.0000 mg | ORAL_TABLET | Freq: Every day | ORAL | 1 refills | Status: AC

## 2023-09-30 NOTE — Telephone Encounter (Signed)
 Noted, refer to results notes for additional documentation

## 2023-09-30 NOTE — Telephone Encounter (Signed)
 Copied from CRM #[id number]. Topic: Clinical - Medication Question >> Sep 30, 2023 12:10 PM Adrianna P wrote: Reason for CRM: Pt said he is not taking hydroxyzine 10 and would like to go ahead and be prescribed zoloft . [phone].

## 2023-09-30 NOTE — Telephone Encounter (Signed)
 See Lab response, Rx sent in- to start with half tablet for 2 weeks then increase to whole tablet.

## 2023-10-01 DIAGNOSIS — D649 Anemia, unspecified: Secondary | ICD-10-CM | POA: Insufficient documentation

## 2023-10-01 DIAGNOSIS — L080 Pyoderma: Secondary | ICD-10-CM | POA: Insufficient documentation

## 2023-10-01 NOTE — Assessment & Plan Note (Signed)
 Stable, continue on protonix .

## 2023-10-01 NOTE — Assessment & Plan Note (Signed)
 Ongoing anxiety, drinking alcohol  to help cope with anxiety, discussed adverse effects of ETOH and frequent xanax  use.  Discussed safer options with SSRI, he said he will look to see what he has used in the past and let us  know.  Encouraged decreasing ETOH use.

## 2023-10-01 NOTE — Assessment & Plan Note (Signed)
 Blood pressure well controlled, goal bp <140/90 Continue current medications and dietary modifications follow metabolic panel

## 2023-10-01 NOTE — Assessment & Plan Note (Signed)
 No new symptoms, monitoring PSA

## 2023-10-01 NOTE — Assessment & Plan Note (Signed)
 Ongoing, continue lifestyle modifications

## 2023-10-01 NOTE — Assessment & Plan Note (Signed)
 Ongoing, mupirocin  ointment has been effective, will refill.

## 2023-10-01 NOTE — Assessment & Plan Note (Signed)
 Encouraged cessation.

## 2023-10-01 NOTE — Assessment & Plan Note (Signed)
 Continue dietary modification, and follow lipids

## 2023-10-01 NOTE — Assessment & Plan Note (Signed)
 No signs of blood loss, monitor CBC

## 2023-10-14 ENCOUNTER — Encounter: Payer: Medicare HMO | Admitting: Nurse Practitioner

## 2023-11-03 ENCOUNTER — Ambulatory Visit: Payer: Self-pay

## 2023-11-03 NOTE — Telephone Encounter (Signed)
 FYI Only or Action Required?: Action required by provider: request for appointment.  Patient was last seen in primary care on 09/29/2023 by Caro Harlene POUR, NP.  Called Nurse Triage reporting Abdominal Pain, Vomiting, and Nausea.  Symptoms began yesterday.  Interventions attempted: Rest, hydration, or home remedies.  Symptoms are: intermittent abdominal pain, nausea gradually improving. Diarrhea  and vomiting resolved.  Triage Disposition: See Within 3 Days in Office  Patient/caregiver understands and will follow disposition?: Yes             Copied from CRM [id number]. Topic: Clinical - Red Word Triage >> Nov 03, 2023  3:47 PM Mercer PEDLAR wrote: Red Word that prompted transfer to Nurse Triage: Stomach pain, nausea, vomiting. Reason for Disposition  [1] Vomits everything for less than 8 hours AND [2] not dehydrated  [1] MODERATE pain (e.g., interferes with normal activities) AND [2] pain comes and goes (cramps) AND [3] present > 24 hours  (Exception: Pain with Vomiting or Diarrhea - see that Guideline.)  Answer Assessment - Initial Assessment Questions 1. LOCATION: Where does it hurt?      Lower,like a band across the lower part  2. RADIATION: Does the pain shoot anywhere else? (e.g., chest, back)     No.  3. ONSET: When did the pain begin? (Minutes, hours or days ago)      Last night.  4. SUDDEN: Gradual or sudden onset?     He states he thinks what triggered it was throwing up.  5. PATTERN Does the pain come and go, or is it constant?     Comes and goes.  6. SEVERITY: How bad is the pain?  (e.g., Scale 1-10; mild, moderate, or severe)     None present at this time. He states more moderate, not severe pain when he feels it.  7. RECURRENT SYMPTOM: Have you ever had this type of stomach pain before? If Yes, ask: When was the last time? and What happened that time?      He states this has been happening once a month that he gets sick like this since  February.  8. CAUSE: What do you think is causing the stomach pain? (e.g., gallstones, recent abdominal surgery)     Unsure. He states he ate crab legs last night and states he properly refrigerated them and fully cooked them.  9. RELIEVING/AGGRAVATING FACTORS: What makes it better or worse? (e.g., antacids, bending or twisting motion, bowel movement)     No.  10. OTHER SYMPTOMS: Do you have any other symptoms? (e.g., back pain, diarrhea, fever, urination pain, vomiting)       Vomiting (Sunday evening, resolved 7pm last night), nausea, reports increased sleep (x 1 week), diarrhea (resolved). He states today he has only eaten a protein bar. He reports he has had a cup of coffee and about 4- 16oz bottles of water . Denies any fever, back pain, blood or black tarry stool, no blood in vomit.  Protocols used: Abdominal Pain - Male-A-AH, Vomiting With [facility]

## 2023-11-03 NOTE — Telephone Encounter (Signed)
 Patient was scheduled for evaluation on 11/06/2023

## 2023-11-06 ENCOUNTER — Ambulatory Visit (INDEPENDENT_AMBULATORY_CARE_PROVIDER_SITE_OTHER): Admitting: Adult Health

## 2023-11-06 ENCOUNTER — Encounter: Payer: Self-pay | Admitting: Adult Health

## 2023-11-06 VITALS — BP 122/64 | HR 65 | Temp 99.2°F | Ht 66.0 in | Wt 171.8 lb

## 2023-11-06 DIAGNOSIS — F419 Anxiety disorder, unspecified: Secondary | ICD-10-CM

## 2023-11-06 DIAGNOSIS — I10 Essential (primary) hypertension: Secondary | ICD-10-CM

## 2023-11-06 DIAGNOSIS — K529 Noninfective gastroenteritis and colitis, unspecified: Secondary | ICD-10-CM | POA: Diagnosis not present

## 2023-11-06 DIAGNOSIS — K219 Gastro-esophageal reflux disease without esophagitis: Secondary | ICD-10-CM

## 2023-11-06 DIAGNOSIS — R197 Diarrhea, unspecified: Secondary | ICD-10-CM

## 2023-11-06 DIAGNOSIS — L821 Other seborrheic keratosis: Secondary | ICD-10-CM | POA: Diagnosis not present

## 2023-11-06 LAB — CBC WITH DIFFERENTIAL/PLATELET
Absolute Lymphocytes: 557 {cells}/uL — ABNORMAL LOW (ref 850–3900)
Absolute Monocytes: 419 {cells}/uL (ref 200–950)
Basophils Absolute: 41 {cells}/uL (ref 0–200)
Basophils Relative: 0.9 %
Eosinophils Absolute: 101 {cells}/uL (ref 15–500)
Eosinophils Relative: 2.2 %
HCT: 40.3 % (ref 38.5–50.0)
Hemoglobin: 13.9 g/dL (ref 13.2–17.1)
MCH: 35.4 pg — ABNORMAL HIGH (ref 27.0–33.0)
MCHC: 34.5 g/dL (ref 32.0–36.0)
MCV: 102.5 fL — ABNORMAL HIGH (ref 80.0–100.0)
MPV: 10.1 fL (ref 7.5–12.5)
Monocytes Relative: 9.1 %
Neutro Abs: 3482 {cells}/uL (ref 1500–7800)
Neutrophils Relative %: 75.7 %
Platelets: 190 Thousand/uL (ref 140–400)
RBC: 3.93 Million/uL — ABNORMAL LOW (ref 4.20–5.80)
RDW: 12.6 % (ref 11.0–15.0)
Total Lymphocyte: 12.1 %
WBC: 4.6 Thousand/uL (ref 3.8–10.8)

## 2023-11-06 LAB — BASIC METABOLIC PANEL WITH GFR
BUN: 15 mg/dL (ref 7–25)
CO2: 29 mmol/L (ref 20–32)
Calcium: 8.9 mg/dL (ref 8.6–10.3)
Chloride: 100 mmol/L (ref 98–110)
Creat: 1.03 mg/dL (ref 0.70–1.28)
Glucose, Bld: 84 mg/dL (ref 65–99)
Potassium: 3.7 mmol/L (ref 3.5–5.3)
Sodium: 139 mmol/L (ref 135–146)
eGFR: 75 mL/min/1.73m2 (ref >=60)

## 2023-11-06 MED ORDER — ESOMEPRAZOLE MAGNESIUM 20 MG PO CPDR: 20.0000 mg | DELAYED_RELEASE_CAPSULE | Freq: Every day | ORAL | Status: AC

## 2023-11-06 MED ORDER — ONDANSETRON HCL 4 MG PO TABS: 4.0000 mg | ORAL_TABLET | Freq: Three times a day (TID) | ORAL | 0 refills | Status: DC | PRN

## 2023-11-06 NOTE — Progress Notes (Signed)
 [facility] clinic  Provider:  Jereld Serum DNP  Code Status:  Full Code  Goals of Care:     02/10/2023   11:10 AM  Advanced Directives  Does Patient Have a Medical Advance Directive? No  Would patient like information on creating a medical advance directive? No - Patient declined     Chief Complaint  Patient presents with   Diarrhea    Patient states he's been having flu like symptoms for the last 6 months. Diarrhea and vomiting off and on no fever.    Discussed the use of AI scribe software for clinical note transcription with the patient, who gave verbal consent to proceed.  HPI: Patient is a 78 y.o. male seen today for an acute visit for diarrhea.  He has been experiencing flu-like symptoms since February, with the current episode starting on Sunday. Symptoms include myalgias, nausea, and hot and cold flashes. He denies fever but has occasional coughing. Vomiting occurred on Monday, and diarrhea subsided by Tuesday. Nausea persists, but he has not taken any medication for it.  He describes abdominal pain in the lower abdomen as a 'band' of pain, uncertain if related to vomiting. He avoids fatty and spicy foods, consuming a bland diet of egg drop soup and crackers.  He has a history of acid reflux, switching from Protonix  to over-the-counter Nexium , taking 20 mg daily. Severe heartburn in August was managed with Tums and Nexium .  Hypertension is managed with hydrochlorothiazide  25 mg daily, which also helps with swelling. For anxiety, he occasionally takes 0.25 mg of Xanax  as needed, cutting a 1 mg tablet into quarters.  He has a history of vertigo and is cautious about medications causing dizziness. A past dermatological issue on his foot was treated with mupirocin  after other treatments were ineffective.  He is concerned about rough, brown spots on his face, suspecting age spots, and has not applied any treatment.    Past Medical History:  Diagnosis Date    Adenomatous colon polyp    Allergy    Anxiety    Arthritis    Cataract    removed bilateral   GERD (gastroesophageal reflux disease)    Graves disease    Per [facility] New Patient Packet   H/O hemorrhoids    H/O hiatal hernia    Heart murmur 2007   per pt history- possible murmur by [facility]- states negative stress test   Hyperlipidemia    Pneumonia    dx 03/08/21   Prostate cancer (HCC) 01/20/2013   Sleep apnea    CPAP.  Trying to use it. (NOT USING CURRENTLY )   DX YEARS AGO   Thyroid disease    graves   Ulcer approx 1970   HX peptic ulcer    Past Surgical History:  Procedure Laterality Date   CATARACT EXTRACTION W/ INTRAOCULAR LENS  IMPLANT, BILATERAL     COLONOSCOPY  2017   LYMPHADENECTOMY Bilateral 03/17/2013   Procedure: LYMPHADENECTOMY PELVIC LYMPH NODE DISSECTION;  Surgeon: Alm GORMAN Fragmin, MD;  Location: [facility];  Service: Urology;  Laterality: Bilateral;   POLYPECTOMY     PROSTATE BIOPSY     ROBOT ASSISTED LAPAROSCOPIC RADICAL PROSTATECTOMY N/A 03/17/2013   Procedure: ROBOTIC ASSISTED LAPAROSCOPIC RADICAL PROSTATECTOMY;  Surgeon: Alm GORMAN Fragmin, MD;  Location: [facility];  Service: Urology;  Laterality: N/A;   TONSILLECTOMY     TOTAL KNEE ARTHROPLASTY Left 10/15/2016   Procedure: TOTAL KNEE ARTHROPLASTY;  Surgeon: Beverley Evalene BIRCH, MD;  Location: [facility] OR;  Service:  Orthopedics;  Laterality: Left;   TRANSURETHRAL RESECTION OF PROSTATE  12/01/2012   Dr Alline   UPPER GASTROINTESTINAL ENDOSCOPY      Allergies  Allergen Reactions   Bee Venom Swelling    Throat swells    Penicillins Rash and Other (See Comments)    PATIENT HAS HAD A PCN REACTION WITH IMMEDIATE RASH, FACIAL/TONGUE/THROAT SWELLING, SOB, OR LIGHTHEADEDNESS WITH HYPOTENSION:  #  #  #  YES  #  #  #   Has patient had a PCN reaction causing severe rash involving mucus membranes or skin necrosis: No Has patient had a PCN reaction that required hospitalization: No Has patient had a PCN reaction occurring within the last  10 years: No If all of the above answers are NO, then may proceed with Cephalosporin use.    Hydrocodone-Acetaminophen  Nausea And Vomiting    Outpatient Encounter Medications as of 11/06/2023  Medication Sig   albuterol  (VENTOLIN  HFA) 108 (90 Base) MCG/ACT inhaler Inhale 2 puffs into the lungs every 6 (six) hours as needed for wheezing or shortness of breath.   ALPRAZolam  (XANAX ) 1 MG tablet Take 0.5 tablets (0.5 mg total) by mouth daily as needed for anxiety. TAKE 1/2 TABLET BY MOUTH DAILY AS NEEDED   cetirizine (ZYRTEC) 10 MG tablet Take 10 mg by mouth daily.   cholecalciferol (VITAMIN D ) 1000 units tablet Take 1,000 Units by mouth daily.   diclofenac Sodium (VOLTAREN) 1 % GEL APPLY 4 GRAMS TO AFFECTED AREA THREE TIMES A DAY AS NEEDED FOR L KNEE PAIN   EPINEPHrine  0.3 mg/0.3 mL IJ SOAJ injection Inject 0.3 mLs (0.3 mg total) into the muscle once. For allergic reaction to bee stings   esomeprazole  (NEXIUM ) 20 MG capsule Take 1 capsule (20 mg total) by mouth daily at 12 noon.   fluticasone  (FLONASE ) 50 MCG/ACT nasal spray Place 2 sprays into both nostrils daily.   hydrochlorothiazide  (HYDRODIURIL ) 25 MG tablet Take 1 tablet (25 mg total) by mouth daily as needed.   mupirocin  ointment (BACTROBAN ) 2 % Apply 1 Application topically 2 (two) times daily.   ondansetron  (ZOFRAN ) 4 MG tablet Take 1 tablet (4 mg total) by mouth every 8 (eight) hours as needed for nausea or vomiting.   potassium chloride  SA (KLOR-CON  M) 20 MEQ tablet Take 2 tablets (40 mEq total) by mouth once for 1 dose.   sertraline  (ZOLOFT ) 50 MG tablet Take 1 tablet (50 mg total) by mouth daily.   vitamin B-12 (CYANOCOBALAMIN ) 1000 MCG tablet Take 1,000 mcg by mouth daily.   vitamin E 100 UNIT capsule Take by mouth daily.   pantoprazole  (PROTONIX ) 40 MG tablet Take 40 mg by mouth daily. (Patient not taking: Reported on 11/06/2023)   Facility-Administered Encounter Medications as of 11/06/2023  Medication   0.9 %  sodium chloride   infusion    Review of Systems:  Review of Systems  Constitutional:  Negative for activity change, appetite change and fever.  HENT:  Negative for sore throat.   Eyes: Negative.   Cardiovascular:  Negative for chest pain and leg swelling.  Gastrointestinal:  Positive for diarrhea, nausea and vomiting. Negative for abdominal distention.  Genitourinary:  Negative for dysuria, frequency and urgency.  Skin:  Negative for color change.  Neurological:  Negative for dizziness and headaches.  Psychiatric/Behavioral:  Negative for behavioral problems and sleep disturbance. The patient is not nervous/anxious.     Health Maintenance  Topic Date Due   Influenza Vaccine  09/26/2023   Medicare Annual Wellness (AWV)  10/07/2023   Colonoscopy  04/04/2024   DTaP/Tdap/Td (2 - Td or Tdap) 01/02/2026   Pneumococcal Vaccine: 50+ Years  Completed   Hepatitis C Screening  Completed   Zoster Vaccines- Shingrix  Completed   HPV VACCINES  Aged Out   Meningococcal B Vaccine  Aged Out   COVID-19 Vaccine  Discontinued    Physical Exam: Vitals:   11/06/23 1254  BP: 122/64  Pulse: 65  Temp: 99.2 F (37.3 C)  SpO2: 97%  Weight: 171 lb 12.8 oz (77.9 kg)  Height: 5' 6 (1.676 m)   Body mass index is 27.73 kg/m. Physical Exam Constitutional:      Appearance: Normal appearance.  HENT:     Head: Normocephalic and atraumatic.     Mouth/Throat:     Mouth: Mucous membranes are moist.  Eyes:     Conjunctiva/sclera: Conjunctivae normal.  Cardiovascular:     Rate and Rhythm: Normal rate and regular rhythm.     Pulses: Normal pulses.     Heart sounds: Normal heart sounds.  Pulmonary:     Effort: Pulmonary effort is normal.     Breath sounds: Normal breath sounds.  Abdominal:     General: Bowel sounds are normal.     Palpations: Abdomen is soft.  Musculoskeletal:        General: No swelling. Normal range of motion.     Cervical back: Normal range of motion.  Skin:    General: Skin is warm and  dry.     Comments: Has rough light brown skin on bilateral face  Neurological:     General: No focal deficit present.     Mental Status: He is alert and oriented to person, place, and time.  Psychiatric:        Mood and Affect: Mood normal.        Behavior: Behavior normal.        Thought Content: Thought content normal.        Judgment: Judgment normal.     Labs reviewed: Basic Metabolic Panel: Recent Labs    01/21/23 1353 05/23/23 1608 09/29/23 1208  NA 140 132* 141  K 3.4* 3.7 4.1  CL 94* 90* 103  CO2 30 30 29   GLUCOSE 93 102 84  BUN 16 22 21   CREATININE 0.96 1.03 0.84  CALCIUM 9.2 9.4 8.9   Liver Function Tests: Recent Labs    11/18/22 1050 09/29/23 1208  AST 46* 27  ALT 29 15  BILITOT 0.7 0.5  PROT 7.3 6.4   No results for input(s): LIPASE, AMYLASE in the last 8760 hours. No results for input(s): AMMONIA in the last 8760 hours. CBC: Recent Labs    01/21/23 1353 05/23/23 1608 09/29/23 1208  WBC 5.6 4.5 4.0  NEUTROABS 4,502 3,501 2,856  HGB 12.9* 11.9* 13.3  HCT 36.0* 33.1* 40.0  MCV 97.3 99.4 104.2*  PLT 155 105* 205   Lipid Panel: Recent Labs    09/29/23 1208  CHOL 131  HDL 74  LDLCALC 42  TRIG 69  CHOLHDL 1.8   No results found for: HGBA1C  Procedures since last visit: No results found.  Assessment/Plan  1. Acute gastroenteritis (Primary) -  with nausea, vomiting, diarrhea, and lower abdominal pain. Differential includes viral gastroenteritis and possible Clostridium difficile infection due to mucoid stools. Recent crab leg consumption may be a factor. - Order stool culture for Clostridium difficile. - Prescribe Zofran  for nausea PRN. - Advise dietary modifications: avoid fatty/spicy foods, consume bland foods. -  Order CBC for infection. - Order CMP for electrolytes. - Advise drinking Gatorade for electrolyte replenishment. - ondansetron  (ZOFRAN ) 4 MG tablet; Take 1 tablet (4 mg total) by mouth every 8 (eight) hours as  needed for nausea or vomiting.  Dispense: 20 tablet; Refill: 0 - Clostridium difficile culture-fecal - CBC with Differential/Platelets - Basic Metabolic Panel  2. Gastroesophageal reflux disease, unspecified whether esophagitis present -  managed with Nexium  20 mg daily. Severe heartburn in August managed with Tums and Nexium . - Continue Nexium  20 mg daily. - Discontinue Protonix  prescription.  3. Primary hypertension -  controlled with hydrochlorothiazide  25 mg daily. Blood pressure 122/64 mmHg. - Continue hydrochlorothiazide  25 mg daily.  4. Anxiety -  managed with Xanax  0.25 mg PRN. Discussed cost-effectiveness of splitting 1 mg tablets. - Continue Xanax  0.25 mg PRN.  5. Seborrheic keratoses -  keratosis on face, rough and bothersome during shaving. Previous topical treatment ineffective. - Refer to dermatologist, Dr. Norleen Hurst, for evaluation and management.  - Ambulatory referral to Dermatology      Labs/tests ordered:  CBC, BMP, stool culture for C-difficile   Return if symptoms worsen or fail to improve.  Gerarda Conklin Medina-Vargas, NP

## 2023-11-07 ENCOUNTER — Ambulatory Visit: Payer: Self-pay | Admitting: Adult Health

## 2023-11-07 ENCOUNTER — Encounter: Payer: Self-pay | Admitting: Nurse Practitioner

## 2023-11-07 ENCOUNTER — Ambulatory Visit (INDEPENDENT_AMBULATORY_CARE_PROVIDER_SITE_OTHER): Admitting: Nurse Practitioner

## 2023-11-07 VITALS — BP 110/76 | HR 75 | Temp 97.2°F | Resp 16 | Ht 66.0 in | Wt 171.4 lb

## 2023-11-07 DIAGNOSIS — R197 Diarrhea, unspecified: Secondary | ICD-10-CM | POA: Diagnosis not present

## 2023-11-07 DIAGNOSIS — Z0000 Encounter for general adult medical examination without abnormal findings: Secondary | ICD-10-CM | POA: Diagnosis not present

## 2023-11-07 NOTE — Progress Notes (Signed)
 Subjective:   Hunter Porter is a 78 y.o. male who presents for Medicare Annual/Subsequent preventive examination.  Visit Complete: In person [employer]   Cardiac Risk Factors include: advanced age (>70men, >32 women);male gender;dyslipidemia     Objective:    Today's Vitals   11/07/23 1329  BP: 110/76  Pulse: 75  Resp: 16  Temp: (!) 97.2 F (36.2 C)  SpO2: 93%  Weight: 171 lb 6.4 oz (77.7 kg)  Height: 5' 6 (1.676 m)   Body mass index is 27.66 kg/m.     11/07/2023    1:26 PM 02/10/2023   11:10 AM 11/18/2022    9:56 AM 10/07/2022    2:17 PM 10/01/2022    2:12 PM 04/12/2022   10:34 AM 03/19/2021    1:43 PM  Advanced Directives  Does Patient Have a Medical Advance Directive? No No No No No No No  Would patient like information on creating a medical advance directive? No - Patient declined No - Patient declined No - Patient declined No - Patient declined No - Patient declined No - Patient declined No - Patient declined    Current Medications (verified) Outpatient Encounter Medications as of 11/07/2023  Medication Sig   albuterol  (VENTOLIN  HFA) 108 (90 Base) MCG/ACT inhaler Inhale 2 puffs into the lungs every 6 (six) hours as needed for wheezing or shortness of breath.   ALPRAZolam  (XANAX ) 1 MG tablet Take 0.5 tablets (0.5 mg total) by mouth daily as needed for anxiety. TAKE 1/2 TABLET BY MOUTH DAILY AS NEEDED   cetirizine (ZYRTEC) 10 MG tablet Take 10 mg by mouth daily.   cholecalciferol (VITAMIN D ) 1000 units tablet Take 1,000 Units by mouth daily.   diclofenac Sodium (VOLTAREN) 1 % GEL APPLY 4 GRAMS TO AFFECTED AREA THREE TIMES A DAY AS NEEDED FOR L KNEE PAIN   EPINEPHrine  0.3 mg/0.3 mL IJ SOAJ injection Inject 0.3 mLs (0.3 mg total) into the muscle once. For allergic reaction to bee stings   esomeprazole  (NEXIUM ) 20 MG capsule Take 1 capsule (20 mg total) by mouth daily at 12 noon.   fluticasone  (FLONASE ) 50 MCG/ACT nasal spray Place 2 sprays into both nostrils daily.    hydrochlorothiazide  (HYDRODIURIL ) 25 MG tablet Take 1 tablet (25 mg total) by mouth daily as needed.   mupirocin  ointment (BACTROBAN ) 2 % Apply 1 Application topically 2 (two) times daily.   ondansetron  (ZOFRAN ) 4 MG tablet Take 1 tablet (4 mg total) by mouth every 8 (eight) hours as needed for nausea or vomiting.   potassium chloride  SA (KLOR-CON  M) 20 MEQ tablet Take 2 tablets (40 mEq total) by mouth once for 1 dose.   sertraline  (ZOLOFT ) 50 MG tablet Take 1 tablet (50 mg total) by mouth daily.   vitamin B-12 (CYANOCOBALAMIN ) 1000 MCG tablet Take 1,000 mcg by mouth daily.   vitamin E 100 UNIT capsule Take by mouth daily.   pantoprazole  (PROTONIX ) 40 MG tablet Take 40 mg by mouth daily. (Patient not taking: Reported on 11/07/2023)   Facility-Administered Encounter Medications as of 11/07/2023  Medication   0.9 %  sodium chloride  infusion    Allergies (verified) Bee venom, Penicillins, and Hydrocodone-acetaminophen    History: Past Medical History:  Diagnosis Date   Adenomatous colon polyp    Allergy    Anxiety    Arthritis    Cataract    removed bilateral   GERD (gastroesophageal reflux disease)    Graves disease    Per [facility] New Patient Packet   H/O  hemorrhoids    H/O hiatal hernia    Heart murmur 2007   per pt history- possible murmur by [facility]- states negative stress test   Hyperlipidemia    Pneumonia    dx 03/08/21   Prostate cancer (HCC) 01/20/2013   Sleep apnea    CPAP.  Trying to use it. (NOT USING CURRENTLY )   DX YEARS AGO   Thyroid disease    graves   Ulcer approx 1970   HX peptic ulcer   Past Surgical History:  Procedure Laterality Date   CATARACT EXTRACTION W/ INTRAOCULAR LENS  IMPLANT, BILATERAL     COLONOSCOPY  2017   LYMPHADENECTOMY Bilateral 03/17/2013   Procedure: LYMPHADENECTOMY PELVIC LYMPH NODE DISSECTION;  Surgeon: Alm GORMAN Fragmin, MD;  Location: [facility];  Service: Urology;  Laterality: Bilateral;   POLYPECTOMY     PROSTATE BIOPSY     ROBOT ASSISTED  LAPAROSCOPIC RADICAL PROSTATECTOMY N/A 03/17/2013   Procedure: ROBOTIC ASSISTED LAPAROSCOPIC RADICAL PROSTATECTOMY;  Surgeon: Alm GORMAN Fragmin, MD;  Location: [facility];  Service: Urology;  Laterality: N/A;   TONSILLECTOMY     TOTAL KNEE ARTHROPLASTY Left 10/15/2016   Procedure: TOTAL KNEE ARTHROPLASTY;  Surgeon: Beverley Evalene BIRCH, MD;  Location: [facility];  Service: Orthopedics;  Laterality: Left;   TRANSURETHRAL RESECTION OF PROSTATE  12/01/2012   Dr Fragmin   UPPER GASTROINTESTINAL ENDOSCOPY     Family History  Problem Relation Age of Onset   Cancer Mother        brain   Cancer Brother        Question lung   Drug abuse Maternal Aunt    Colon polyps Neg Hx    Colon cancer Neg Hx    Rectal cancer Neg Hx    Stomach cancer Neg Hx    Esophageal cancer Neg Hx    Social History   Socioeconomic History   Marital status: Single    Spouse name: Not on file   Number of children: Not on file   Years of education: Not on file   Highest education level: Not on file  Occupational History   Not on file  Tobacco Use   Smoking status: Every Day    Current packs/day: 0.50    Types: Cigarettes   Smokeless tobacco: Never  Vaping Use   Vaping status: Never Used  Substance and Sexual Activity   Alcohol  use: Yes    Comment: occassion   Drug use: No   Sexual activity: Not Currently  Other Topics Concern   Not on file  Social History Narrative   Retired. Plays golf 4 days/week.Works in yard. Drinks 6 beers each evening.      Diet: Left blank      Caffeine: yes      Married, if yes what year: Single      Do you live in a house, apartment, assisted living, condo, trailer, ect: House      Is it one or more stories: 2 stories      How many persons live in your home? 2 people      Pets: Yes, dog      Highest level or education completed: 2 years of college      Current/Past profession: [employer]      Exercise:   Yes               Type and how often: Golf and weights         Living Will:  No   DNR:  No   POA/HPOA: No      Functional Status:   Do you have difficulty bathing or dressing yourself? No   Do you have difficulty preparing food or eating? No   Do you have difficulty managing your medications? No   Do you have difficulty managing your finances? No   Do you have difficulty affording your medications? No   Social Drivers of Corporate investment banker Strain: Not on file  Food Insecurity: No Food Insecurity (11/07/2023)   Hunger Vital Sign    Worried About Running Out of Food in the Last Year: Never true    Ran Out of Food in the Last Year: Never true  Transportation Needs: No Transportation Needs (11/07/2023)   PRAPARE - Administrator, Civil Service (Medical): No    Lack of Transportation (Non-Medical): No  Physical Activity: Not on file  Stress: Not on file  Social Connections: Moderately Isolated (11/07/2023)   Social Connection and Isolation Panel    Frequency of Communication with Friends and Family: Three times a week    Frequency of Social Gatherings with Friends and Family: Three times a week    Attends Religious Services: More than 4 times per year    Active Member of Clubs or Organizations: No    Attends Banker Meetings: Never    Marital Status: Divorced    Tobacco Counseling Ready to quit: Not Answered Counseling given: Not Answered   Clinical Intake:  Pre-visit preparation completed: No  Pain : No/denies pain     BMI - recorded: 27.66 Nutritional Status: BMI 25 -29 Overweight Nutritional Risks: None Diabetes: No  How often do you need to have someone help you when you read instructions, pamphlets, or other written materials from your doctor or pharmacy?: 1 - Never         Activities of Daily Living    11/07/2023    1:36 PM  In your present state of health, do you have any difficulty performing the following activities:  Hearing? 1  Vision? 1  Difficulty concentrating or making decisions? 0   Walking or climbing stairs? 1  Comment OA in left knee, trouble walking and climbing stairs  Dressing or bathing? 0  Doing errands, shopping? 0  Preparing Food and eating ? N  Using the Toilet? N  In the past six months, have you accidently leaked urine? N  Do you have problems with loss of bowel control? Y  Managing your Medications? N  Managing your Finances? N  Housekeeping or managing your Housekeeping? N    Patient Care Team: Caro Harlene POUR, NP as PCP - General (Geriatric Medicine) Devere Lonni Righter, MD as Consulting Physician (Urology)  Indicate any recent Medical Services you may have received from other than [facility] providers in the past year (date may be approximate).     Assessment:   This is a routine wellness examination for Rodrick.  Hearing/Vision screen Hearing Screening - Comments:: Some hearing concerns. Patient wears hearing aids in both ears. Vision Screening - Comments:: Some vision concerns. Patient wears prescription glasses. Patient last eye exam 2024.   Goals Addressed   None    Depression Screen    11/07/2023    1:23 PM 05/23/2023    3:25 PM 10/07/2022    2:16 PM 04/12/2022   10:34 AM 10/08/2021   10:24 AM 03/19/2021    1:43 PM 03/26/2017   10:40 AM  PHQ 2/9 Scores  PHQ - 2 Score 0  0  0 0 0 0  Exception Documentation   Other- indicate reason in comment box        Fall Risk    11/07/2023    1:23 PM 05/23/2023    3:25 PM 11/18/2022    9:56 AM 10/07/2022    2:16 PM 10/01/2022    2:11 PM  Fall Risk   Falls in the past year? 0 0 0 0 0  Number falls in past yr: 0 0 0 0 0  Injury with Fall? 0 0 0 0 0  Risk for fall due to : No Fall Risks No Fall Risks No Fall Risks No Fall Risks No Fall Risks  Follow up Falls evaluation completed Falls evaluation completed Falls evaluation completed Falls evaluation completed Falls evaluation completed;Education provided;Falls prevention discussed    MEDICARE RISK AT HOME: Medicare Risk at Home Any stairs  in or around the home?: Yes If so, are there any without handrails?: No Home free of loose throw rugs in walkways, pet beds, electrical cords, etc?: Yes Adequate lighting in your home to reduce risk of falls?: Yes Life alert?: No Use of a cane, walker or w/c?: Yes Grab bars in the bathroom?: Yes Shower chair or bench in shower?: Yes Elevated toilet seat or a handicapped toilet?: No  TIMED UP AND GO:  Was the test performed?  No    Cognitive Function:        11/07/2023    1:27 PM 10/07/2022    2:18 PM  6CIT Screen  What Year? 0 points 0 points  What month? 0 points 0 points  What time? 0 points 0 points  Count back from 20 2 points 0 points  Months in reverse 0 points 0 points  Repeat phrase 2 points 0 points  Total Score 4 points 0 points    Immunizations Immunization History  Administered Date(s) Administered   INFLUENZA, HIGH DOSE SEASONAL PF 01/03/2016, 11/27/2016   Influenza Split 01/24/2009   Influenza-Unspecified 03/08/2013, 12/03/2017, 11/26/2018   Moderna Sars-Covid-2 Vaccination 09/06/2020   PFIZER(Purple Top)SARS-COV-2 Vaccination 04/11/2019, 05/04/2019   Pneumococcal Conjugate-13 06/01/2015, 12/03/2017   Pneumococcal Polysaccharide-23 03/18/2013, 10/06/2019   Tdap 01/03/2016   Zoster Recombinant(Shingrix) 11/04/2019, 01/07/2020    TDAP status: Up to date  Flu Vaccine status: Due, Education has been provided regarding the importance of this vaccine. Advised may receive this vaccine at local pharmacy or Health Dept. Aware to provide a copy of the vaccination record if obtained from local pharmacy or Health Dept. Verbalized acceptance and understanding.  Pneumococcal vaccine status: Up to date  Covid-19 vaccine status: Declined, Education has been provided regarding the importance of this vaccine but patient still declined. Advised may receive this vaccine at local pharmacy or Health Dept.or vaccine clinic. Aware to provide a copy of the vaccination record if  obtained from local pharmacy or Health Dept. Verbalized acceptance and understanding.  Qualifies for Shingles Vaccine? Yes   Zostavax completed No   Shingrix Completed?: Yes  Screening Tests Health Maintenance  Topic Date Due   Influenza Vaccine  09/26/2023   Colonoscopy  04/04/2024   Medicare Annual Wellness (AWV)  11/06/2024   DTaP/Tdap/Td (2 - Td or Tdap) 01/02/2026   Pneumococcal Vaccine: 50+ Years  Completed   Hepatitis C Screening  Completed   Zoster Vaccines- Shingrix  Completed   HPV VACCINES  Aged Out   Meningococcal B Vaccine  Aged Out   COVID-19 Vaccine  Discontinued    Health Maintenance  Health Maintenance Due  Topic Date Due   Influenza Vaccine  09/26/2023    Colorectal cancer screening: No longer required.   Lung Cancer Screening: (Low Dose CT Chest recommended if Age 68-80 years, 20 pack-year currently smoking OR have quit w/in 15years.) does not qualify.   Lung Cancer Screening Referral: na  Additional Screening:  Hepatitis C Screening: does qualify; Completed   Vision Screening: Recommended annual ophthalmology exams for early detection of glaucoma and other disorders of the eye. Is the patient up to date with their annual eye exam?  No  Who is the provider or what is the [facility] of the [facility] in which the patient [facility]  If pt is not established with a provider, would they like to be referred to a provider to establish care? No .   Dental Screening: Recommended annual dental exams for proper oral hygiene   Community Resource Referral / Chronic Care Management: CRR required this visit?  No   CCM required this visit?  No     Plan:     I have personally reviewed and noted the following in the patient's chart:   Medical and social history Use of alcohol , tobacco or illicit drugs  Current medications and supplements including opioid prescriptions. Patient is not currently taking opioid prescriptions. Functional ability and  status Nutritional status Physical activity Advanced directives List of other physicians Hospitalizations, surgeries, and ER visits in previous 12 months Vitals Screenings to include cognitive, depression, and falls Referrals and appointments  In addition, I have reviewed and discussed with patient certain preventive protocols, quality metrics, and best practice recommendations. A written personalized care plan for preventive services as well as general preventive health recommendations were provided to patient.     Harlene MARLA An, NP   11/07/2023

## 2023-11-07 NOTE — Progress Notes (Signed)
-    no anemia -  electrolytes normal -  continue taking liquid IV drink

## 2023-11-07 NOTE — Patient Instructions (Signed)
  Mr. Smail , Thank you for taking time to come for your Medicare Wellness Visit. I appreciate your ongoing commitment to your health goals. Please review the following plan we discussed and let me know if I can assist you in the future.   Dont forget to schedule eye exam  To get flu shot at local pharmacy or here in office when feeling better Make sure they send us  the record if you get at pharmacy   This is a list of the screening recommended for you and due dates:  Health Maintenance  Topic Date Due   Flu Shot  09/26/2023   Colon Cancer Screening  04/04/2024   Medicare Annual Wellness Visit  11/06/2024   DTaP/Tdap/Td vaccine (2 - Td or Tdap) 01/02/2026   Pneumococcal Vaccine for age over 58  Completed   Hepatitis C Screening  Completed   Zoster (Shingles) Vaccine  Completed   HPV Vaccine  Aged Out   Meningitis B Vaccine  Aged Out   COVID-19 Vaccine  Discontinued

## 2023-11-11 LAB — CLOSTRIDIUM DIFFICILE CULTURE-FECAL

## 2023-11-11 NOTE — Progress Notes (Signed)
-   Stool for C. difficile negative - Electrolytes, WBC normal -No anemia

## 2023-11-24 DIAGNOSIS — L570 Actinic keratosis: Secondary | ICD-10-CM | POA: Diagnosis not present

## 2023-11-24 DIAGNOSIS — X32XXXD Exposure to sunlight, subsequent encounter: Secondary | ICD-10-CM | POA: Diagnosis not present

## 2023-11-24 DIAGNOSIS — L820 Inflamed seborrheic keratosis: Secondary | ICD-10-CM | POA: Diagnosis not present

## 2023-12-17 ENCOUNTER — Ambulatory Visit: Payer: Self-pay

## 2023-12-17 ENCOUNTER — Ambulatory Visit: Admitting: Sports Medicine

## 2023-12-17 ENCOUNTER — Encounter: Payer: Self-pay | Admitting: Sports Medicine

## 2023-12-17 VITALS — BP 104/62 | HR 64 | Temp 98.1°F | Ht 66.0 in | Wt 167.6 lb

## 2023-12-17 DIAGNOSIS — R42 Dizziness and giddiness: Secondary | ICD-10-CM

## 2023-12-17 DIAGNOSIS — R112 Nausea with vomiting, unspecified: Secondary | ICD-10-CM

## 2023-12-17 DIAGNOSIS — K219 Gastro-esophageal reflux disease without esophagitis: Secondary | ICD-10-CM | POA: Diagnosis not present

## 2023-12-17 DIAGNOSIS — I10 Essential (primary) hypertension: Secondary | ICD-10-CM | POA: Diagnosis not present

## 2023-12-17 MED ORDER — SUCRALFATE 1 GM/10ML PO SUSP: 1.0000 g | Freq: Three times a day (TID) | ORAL | 0 refills | Status: DC

## 2023-12-17 MED ORDER — HYDROCHLOROTHIAZIDE 12.5 MG PO CAPS: 12.5000 mg | ORAL_CAPSULE | Freq: Every day | ORAL | 0 refills | Status: DC

## 2023-12-17 NOTE — Progress Notes (Signed)
 Careteam: Patient Care Team: Caro Harlene POUR, NP as PCP - General (Geriatric Medicine) Devere Lonni Righter, MD as Consulting Physician (Urology)  PLACE OF SERVICE:  [facility]  Advanced Directive information    Allergies  Allergen Reactions   Bee Venom Swelling    Throat swells    Penicillins Rash and Other (See Comments)    PATIENT HAS HAD A PCN REACTION WITH IMMEDIATE RASH, FACIAL/TONGUE/THROAT SWELLING, SOB, OR LIGHTHEADEDNESS WITH HYPOTENSION:  #  #  #  YES  #  #  #   Has patient had a PCN reaction causing severe rash involving mucus membranes or skin necrosis: No Has patient had a PCN reaction that required hospitalization: No Has patient had a PCN reaction occurring within the last 10 years: No If all of the above answers are NO, then may proceed with Cephalosporin use.    Hydrocodone-Acetaminophen  Nausea And Vomiting    Chief Complaint  Patient presents with   Acute Visit    Patient has been experience nausea stated he's been having some burning in his abdomen.  Patient stated he hasn't eaten since Sunday because its hard for him to eat.  Patient has taken ondansetron  today to help. Along with nexium        Discussed the use of AI scribe software for clinical note transcription with the patient, who gave verbal consent to proceed.  History of Present Illness  Hunter Porter is a 78 year old male who presents with recurrent vomiting and inability to eat.  He began experiencing vomiting on Sunday night around 11 PM, which continued every hour until 8 AM on Monday, totaling approximately nine episodes. Since then, he has been unable to eat solid foods and has only managed to consume small amounts of a nutritional drink over the past two days. He feels dizzy and lightheaded. No abdominal pain, but he reports difficulty swallowing large pills, such as multivitamins, which sometimes get stuck and require additional water  to swallow. No coughing or choking  while eating or drinking. No blood in stool, dark stools, or diarrhea.  He experiences acid reflux with a burning sensation that extends up his throat, His symptoms have been recurring monthly since May, with notable episodes in June and August. During these times, he tried different antacids and Tums, which provided some relief but did not resolve his inability to eat. He has been taking Nexium , initially one 20 mg tablet daily, but increased to two 20 mg tablets daily when he realized the prescription was for 40 mg.  He has not taken his hydrochlorothiazide  since Friday or Saturday, as he has been avoiding medications unless necessary.  He has a history of smoking less than half a pack of cigarettes daily and quit drinking alcohol  about a month ago, although he occasionally had wine.    Review of Systems:  Review of Systems  Constitutional:  Negative for chills and fever.  HENT:  Negative for congestion and sore throat.   Respiratory:  Negative for cough, sputum production and shortness of breath.   Cardiovascular:  Negative for chest pain, palpitations and leg swelling.  Gastrointestinal:  Negative for abdominal pain, heartburn and nausea.  Genitourinary:  Negative for dysuria, frequency and hematuria.  Musculoskeletal:  Negative for falls and myalgias.  Neurological:  Positive for dizziness.   Negative unless indicated in HPI.   Past Medical History:  Diagnosis Date   Adenomatous colon polyp    Allergy    Anxiety    Arthritis  Cataract    removed bilateral   GERD (gastroesophageal reflux disease)    Graves disease    Per [facility] New Patient Packet   H/O hemorrhoids    H/O hiatal hernia    Heart murmur 2007   per pt history- possible murmur by [facility]- states negative stress test   Hyperlipidemia    Pneumonia    dx 03/08/21   Prostate cancer (HCC) 01/20/2013   Sleep apnea    CPAP.  Trying to use it. (NOT USING CURRENTLY )   DX YEARS AGO   Thyroid disease    graves   Ulcer  approx 1970   HX peptic ulcer   Past Surgical History:  Procedure Laterality Date   CATARACT EXTRACTION W/ INTRAOCULAR LENS  IMPLANT, BILATERAL     COLONOSCOPY  2017   LYMPHADENECTOMY Bilateral 03/17/2013   Procedure: LYMPHADENECTOMY PELVIC LYMPH NODE DISSECTION;  Surgeon: Alm GORMAN Fragmin, MD;  Location: [facility];  Service: Urology;  Laterality: Bilateral;   POLYPECTOMY     PROSTATE BIOPSY     ROBOT ASSISTED LAPAROSCOPIC RADICAL PROSTATECTOMY N/A 03/17/2013   Procedure: ROBOTIC ASSISTED LAPAROSCOPIC RADICAL PROSTATECTOMY;  Surgeon: Alm GORMAN Fragmin, MD;  Location: [facility];  Service: Urology;  Laterality: N/A;   TONSILLECTOMY     TOTAL KNEE ARTHROPLASTY Left 10/15/2016   Procedure: TOTAL KNEE ARTHROPLASTY;  Surgeon: Beverley Evalene BIRCH, MD;  Location: [facility];  Service: Orthopedics;  Laterality: Left;   TRANSURETHRAL RESECTION OF PROSTATE  12/01/2012   Dr Fragmin   UPPER GASTROINTESTINAL ENDOSCOPY     Social History:   reports that he has been smoking cigarettes. He has never used smokeless tobacco. He reports current alcohol  use. He reports that he does not use drugs.  Family History  Problem Relation Age of Onset   Cancer Mother        brain   Cancer Brother        Question lung   Drug abuse Maternal Aunt    Colon polyps Neg Hx    Colon cancer Neg Hx    Rectal cancer Neg Hx    Stomach cancer Neg Hx    Esophageal cancer Neg Hx     Medications: Patient's Medications  New Prescriptions   No medications on file  Previous Medications   ALBUTEROL  (VENTOLIN  HFA) 108 (90 BASE) MCG/ACT INHALER    Inhale 2 puffs into the lungs every 6 (six) hours as needed for wheezing or shortness of breath.   ALPRAZOLAM  (XANAX ) 1 MG TABLET    Take 0.5 tablets (0.5 mg total) by mouth daily as needed for anxiety. TAKE 1/2 TABLET BY MOUTH DAILY AS NEEDED   CETIRIZINE (ZYRTEC) 10 MG TABLET    Take 10 mg by mouth daily.   CHOLECALCIFEROL (VITAMIN D ) 1000 UNITS TABLET    Take 1,000 Units by mouth daily.    DICLOFENAC SODIUM (VOLTAREN) 1 % GEL    APPLY 4 GRAMS TO AFFECTED AREA THREE TIMES A DAY AS NEEDED FOR L KNEE PAIN   EPINEPHRINE  0.3 MG/0.3 ML IJ SOAJ INJECTION    Inject 0.3 mLs (0.3 mg total) into the muscle once. For allergic reaction to bee stings   ESOMEPRAZOLE  (NEXIUM ) 20 MG CAPSULE    Take 1 capsule (20 mg total) by mouth daily at 12 noon.   FLUTICASONE  (FLONASE ) 50 MCG/ACT NASAL SPRAY    Place 2 sprays into both nostrils daily.   HYDROCHLOROTHIAZIDE  (HYDRODIURIL ) 25 MG TABLET    Take 1 tablet (25 mg total) by mouth  daily as needed.   MUPIROCIN  OINTMENT (BACTROBAN ) 2 %    Apply 1 Application topically 2 (two) times daily.   ONDANSETRON  (ZOFRAN ) 4 MG TABLET    Take 1 tablet (4 mg total) by mouth every 8 (eight) hours as needed for nausea or vomiting.   PANTOPRAZOLE  (PROTONIX ) 40 MG TABLET    Take 40 mg by mouth daily.   POTASSIUM CHLORIDE  SA (KLOR-CON  M) 20 MEQ TABLET    Take 2 tablets (40 mEq total) by mouth once for 1 dose.   SERTRALINE  (ZOLOFT ) 50 MG TABLET    Take 1 tablet (50 mg total) by mouth daily.   VITAMIN B-12 (CYANOCOBALAMIN ) 1000 MCG TABLET    Take 1,000 mcg by mouth daily.   VITAMIN E 100 UNIT CAPSULE    Take by mouth daily.  Modified Medications   No medications on file  Discontinued Medications   No medications on file    Physical Exam: Vitals:   12/17/23 1317  BP: 104/62  Pulse: 64  Temp: 98.1 F (36.7 C)  SpO2: 90%  Weight: 167 lb 9.6 oz (76 kg)  Height: 5' 6 (1.676 m)   Body mass index is 27.05 kg/m. BP Readings from Last 3 Encounters:  12/17/23 104/62  11/07/23 110/76  11/06/23 122/64   Wt Readings from Last 3 Encounters:  12/17/23 167 lb 9.6 oz (76 kg)  11/07/23 171 lb 6.4 oz (77.7 kg)  11/06/23 171 lb 12.8 oz (77.9 kg)    Physical Exam Constitutional:      Appearance: Normal appearance.  HENT:     Head: Normocephalic and atraumatic.  Cardiovascular:     Rate and Rhythm: Normal rate and regular rhythm.     Pulses: Normal pulses.     Heart  sounds: Normal heart sounds.  Pulmonary:     Effort: No respiratory distress.     Breath sounds: No stridor. No wheezing or rales.  Abdominal:     General: Bowel sounds are normal. There is no distension.     Palpations: Abdomen is soft.     Tenderness: There is no abdominal tenderness. There is no guarding.  Musculoskeletal:        General: No swelling.  Neurological:     Mental Status: He is alert. Mental status is at baseline.     Sensory: No sensory deficit.     Motor: No weakness.     Labs reviewed: Basic Metabolic Panel: Recent Labs    05/23/23 1608 09/29/23 1208 11/06/23 1341  NA 132* 141 139  K 3.7 4.1 3.7  CL 90* 103 100  CO2 30 29 29   GLUCOSE 102 84 84  BUN 22 21 15   CREATININE 1.03 0.84 1.03  CALCIUM 9.4 8.9 8.9   Liver Function Tests: Recent Labs    09/29/23 1208  AST 27  ALT 15  BILITOT 0.5  PROT 6.4   No results for input(s): LIPASE, AMYLASE in the last 8760 hours. No results for input(s): AMMONIA in the last 8760 hours. CBC: Recent Labs    05/23/23 1608 09/29/23 1208 11/06/23 1341  WBC 4.5 4.0 4.6  NEUTROABS 3,501 2,856 3,482  HGB 11.9* 13.3 13.9  HCT 33.1* 40.0 40.3  MCV 99.4 104.2* 102.5*  PLT 105* 205 190   Lipid Panel: Recent Labs    09/29/23 1208  CHOL 131  HDL 74  LDLCALC 42  TRIG 69  CHOLHDL 1.8   TSH: No results for input(s): TSH in the last 8760 hours. A1C: No results  found for: HGBA1C  Assessment and Plan Assessment & Plan  1. Nausea and vomiting, unspecified vomiting type (Primary)  Take zofran  prn  - Basic Metabolic Panel with eGFR - Ambulatory referral to Gastroenterology - Helicobacter Pylori Special Antigen, Stool - Lipase  2. Gastroesophageal reflux disease, unspecified whether esophagitis present  Denies bloody or dark stools Lost few pounds since last visit Hb stable - Basic Metabolic Panel with eGFR - Ambulatory referral to Gastroenterology - Helicobacter Pylori Special Antigen,  Stool - sucralfate (CARAFATE) 1 GM/10ML suspension; Take 10 mLs (1 g total) by mouth in the morning, at noon, and at bedtime.  Dispense: 420 mL; Refill: 0  3. Primary hypertension  Bp on the lower side Increase oral hydration  Will decrease hydrochlorothiazide  to 12.5  Follow up in 3-4 weeks - hydrochlorothiazide  (MICROZIDE ) 12.5 MG capsule; Take 1 capsule (12.5 mg total) by mouth daily.  Dispense: 30 capsule; Refill: 0  4. Dizziness Ortho stasis  neg Decrease hydrochlorothiazide  to 12.5 Increase oral hydration

## 2023-12-17 NOTE — Telephone Encounter (Signed)
 FYI Only or Action Required?: Action required by provider: request for appointment.  Patient was last seen in primary care on 11/07/2023 by Hunter Harlene POUR, NP.  Called Nurse Triage reporting Vomiting.  Symptoms began several days ago.  Interventions attempted: Prescription medications: Zofran .  Symptoms are: stable.Had vomiting Sunday and Monday, just nausea now.  Triage Disposition: See Physician Within 24 Hours  Patient/caregiver understands and will follow disposition?: Yes     Copied from CRM #[id number]. Topic: Clinical - Red Word Triage >> Dec 17, 2023  9:50 AM Susanna ORN wrote: Red Word that prompted transfer to Nurse Triage: Patient states he's having some type of upper GI problem. States he's been vomiting every since Sunday and vomited all the way until 9AM Monday morning. States that he started feeling a little better on Tuesday but now he can't hold anything down and he's having some burning in his throat like acid reflux. Wants to be seen today if possible. Reason for Disposition  [1] MILD or MODERATE vomiting AND [2] present > 48 hours (2 days)  (Exception: Mild vomiting with associated diarrhea.)  Answer Assessment - Initial Assessment Questions 1. VOMITING SEVERITY: How many times have you vomited in the past 24 hours?      Just nauseated now 2. ONSET: When did the vomiting begin?      Sunday 3. FLUIDS: What fluids or food have you vomited up today? Have you been able to keep any fluids down?     no 4. ABDOMEN PAIN: Are your having any abdomen pain? If Yes : How bad is it and what does it feel like? (e.g., crampy, dull, intermittent, constant)      no 5. DIARRHEA: Is there any diarrhea? If Yes, ask: How many times today?      no 6. CONTACTS: Is there anyone else in the family with the same symptoms?      no 7. CAUSE: What do you think is causing your vomiting?     unsure 8. HYDRATION STATUS: Any signs of dehydration? (e.g., dry mouth [not  only dry lips], too weak to stand) When did you last urinate?     no 9. OTHER SYMPTOMS: Do you have any other symptoms? (e.g., fever, headache, vertigo, vomiting blood or coffee grounds, recent head injury)     no 10. PREGNANCY: Is there any chance you are pregnant? When was your last menstrual period?       N/a  Protocols used: Vomiting-A-AH

## 2023-12-18 LAB — BASIC METABOLIC PANEL WITH GFR
BUN/Creatinine Ratio: 15 (calc) (ref 6–22)
BUN: 23 mg/dL (ref 7–25)
CO2: 31 mmol/L (ref 20–32)
Calcium: 9.8 mg/dL (ref 8.6–10.3)
Chloride: 90 mmol/L — ABNORMAL LOW (ref 98–110)
Creat: 1.5 mg/dL — ABNORMAL HIGH (ref 0.70–1.28)
Glucose, Bld: 91 mg/dL (ref 65–99)
Potassium: 3.5 mmol/L (ref 3.5–5.3)
Sodium: 135 mmol/L (ref 135–146)
eGFR: 48 mL/min/1.73m2 — ABNORMAL LOW (ref >=60)

## 2023-12-18 LAB — LIPASE: Lipase: 36 U/L (ref 7–60)

## 2023-12-19 ENCOUNTER — Ambulatory Visit: Payer: Self-pay | Admitting: Sports Medicine

## 2023-12-19 DIAGNOSIS — N179 Acute kidney failure, unspecified: Secondary | ICD-10-CM

## 2023-12-22 DIAGNOSIS — K219 Gastro-esophageal reflux disease without esophagitis: Secondary | ICD-10-CM | POA: Diagnosis not present

## 2023-12-23 LAB — HELICOBACTER PYLORI  SPECIAL ANTIGEN
MICRO NUMBER:: 17152452
SPECIMEN QUALITY: ADEQUATE

## 2023-12-26 ENCOUNTER — Other Ambulatory Visit

## 2023-12-26 DIAGNOSIS — R112 Nausea with vomiting, unspecified: Secondary | ICD-10-CM | POA: Diagnosis not present

## 2023-12-26 DIAGNOSIS — N179 Acute kidney failure, unspecified: Secondary | ICD-10-CM | POA: Diagnosis not present

## 2023-12-27 LAB — BASIC METABOLIC PANEL WITH GFR
BUN: 20 mg/dL (ref 7–25)
CO2: 29 mmol/L (ref 20–32)
Calcium: 9.1 mg/dL (ref 8.6–10.3)
Chloride: 97 mmol/L — ABNORMAL LOW (ref 98–110)
Creat: 1.17 mg/dL (ref 0.70–1.28)
Glucose, Bld: 93 mg/dL (ref 65–139)
Potassium: 3.6 mmol/L (ref 3.5–5.3)
Sodium: 136 mmol/L (ref 135–146)
eGFR: 64 mL/min/1.73m2 (ref >=60)

## 2023-12-27 LAB — LIPASE: Lipase: 34 U/L (ref 7–60)

## 2023-12-29 ENCOUNTER — Other Ambulatory Visit: Payer: Self-pay | Admitting: Sports Medicine

## 2023-12-29 DIAGNOSIS — K219 Gastro-esophageal reflux disease without esophagitis: Secondary | ICD-10-CM

## 2024-01-05 ENCOUNTER — Ambulatory Visit: Admitting: Nurse Practitioner

## 2024-01-08 ENCOUNTER — Telehealth: Payer: Self-pay

## 2024-01-08 NOTE — Telephone Encounter (Signed)
 Copied from CRM #[id number]. Topic: Referral - Question >> Jan 08, 2024 12:27 PM Chiquita SQUIBB wrote: Reason for CRM: Patient is calling in stating that he has tried to contact [facility], and it is like they hang up on him every time he calls. Patient has waited over 15 days for someone to reach out to him and no one has. Patient is asking if someone from the office can contact them for him.

## 2024-01-09 NOTE — Telephone Encounter (Signed)
 I have sent a message to the Administrator for [facility] to make her aware AND ask her to have someone there call the patient. Also let her know he is an established there and continued care coordination is appreciated.

## 2024-01-12 NOTE — Telephone Encounter (Signed)
 Called Patient and [facility] to return call.

## 2024-01-13 ENCOUNTER — Ambulatory Visit: Payer: Self-pay | Admitting: Adult Health

## 2024-01-13 ENCOUNTER — Other Ambulatory Visit: Payer: Self-pay | Admitting: Nurse Practitioner

## 2024-01-13 ENCOUNTER — Other Ambulatory Visit: Payer: Self-pay | Admitting: Sports Medicine

## 2024-01-13 ENCOUNTER — Encounter: Payer: Self-pay | Admitting: Adult Health

## 2024-01-13 VITALS — BP 126/72 | HR 70 | Temp 97.8°F | Resp 18 | Ht 66.0 in | Wt 173.2 lb

## 2024-01-13 DIAGNOSIS — Z720 Tobacco use: Secondary | ICD-10-CM

## 2024-01-13 DIAGNOSIS — I10 Essential (primary) hypertension: Secondary | ICD-10-CM

## 2024-01-13 DIAGNOSIS — F419 Anxiety disorder, unspecified: Secondary | ICD-10-CM | POA: Diagnosis not present

## 2024-01-13 DIAGNOSIS — R42 Dizziness and giddiness: Secondary | ICD-10-CM | POA: Diagnosis not present

## 2024-01-13 DIAGNOSIS — K219 Gastro-esophageal reflux disease without esophagitis: Secondary | ICD-10-CM

## 2024-01-13 MED ORDER — SUCRALFATE 1 GM/10ML PO SUSP
1.0000 g | Freq: Three times a day (TID) | ORAL | 4 refills | Status: AC
Start: 2024-01-13 — End: ?

## 2024-01-13 MED ORDER — MECLIZINE HCL 25 MG PO TABS: 25.0000 mg | ORAL_TABLET | Freq: Two times a day (BID) | ORAL | 0 refills | Status: AC | PRN

## 2024-01-13 NOTE — Patient Instructions (Signed)
 Vertigo Vertigo is the feeling that you or the things around you are moving or spinning when they're not. It's different than feeling dizzy. It can also cause: Loss of balance. Trouble standing or walking. Nausea and vomiting. This feeling can come and go at any time. It can last from a few seconds to minutes or even hours. It may go away on its own or be treated with medicine. What are the types of vertigo? There are two types of vertigo: Peripheral vertigo happens when parts of your inner ear don't work like they should. This is the more common type. Central vertigo happens when your brain and spinal cord don't work like they should. Your health care provider will do tests to find out what kind of vertigo you have. This will help them decide on the right treatment for you. Follow these instructions at home: Eating and drinking Drink enough fluid to keep your pee (urine) pale yellow. Do not drink alcohol. Activity When you get up in the morning, first sit up on the side of the bed. When you feel okay, stand slowly while holding onto something. Move slowly. Avoid sudden body or head movements. Avoid certain positions, as told by your provider. Use a cane if you have trouble standing or walking. Sit down right away if you feel unsteady. Place items in your home so they're easy for you to reach without bending or leaning over. Return to normal activities when you're told. Ask what things are safe for you to do. General instructions Take your medicines only as told by your provider. Contact a health care provider if: Your medicines don't help or make your vertigo worse. You get new symptoms. You have a fever. You have nausea or vomiting. Your family or friends spot any changes in how you're acting. A part of your body goes numb. You feel tingling and prickling in a part of your body. You get very bad headaches. Get help right away if: You're always dizzy or you faint. You have a  stiff neck. You have trouble moving or speaking. Your hands, arms, or legs feel weak. Your hearing or eyesight changes. These symptoms may be an emergency. Call 911 right away. Do not wait to see if the symptoms will go away. Do not drive yourself to the hospital. This information is not intended to replace advice given to you by your health care provider. Make sure you discuss any questions you have with your health care provider. Document Revised: 11/14/2022 Document Reviewed: 05/17/2022 Elsevier Patient Education  2024 [employer].

## 2024-01-13 NOTE — Telephone Encounter (Signed)
Called patient and LMOM to return call.  

## 2024-01-13 NOTE — Progress Notes (Signed)
 [facility] clinic  Provider:  Jereld Serum DNP  Code Status:  Full Code  Goals of Care:     11/07/2023    1:26 PM  Advanced Directives  Does Patient Have a Medical Advance Directive? No  Would patient like information on creating a medical advance directive? No - Patient declined     Chief Complaint  Patient presents with   Medical Management of Chronic Issues    4 weeks follow-up, needs to colonoscopy and flu vaccine.   Discussed the use of AI scribe software for clinical note transcription with the patient, who gave verbal consent to proceed.  HPI: Patient is a 78 y.o. male seen today for a 4-week follow up of chronic medical issues.  Discussed the use of AI scribe software for clinical note transcription with the patient, who gave verbal consent to proceed.  History of Present Illness   Hunter Porter is a 78 year old male who presents with ongoing acid reflux and nausea.  He has been experiencing persistent acid reflux and nausea despite taking Nexium  40 mg twice daily and sucralfate  liquid 10 mL three times a day. These symptoms have persisted for about three weeks, causing difficulty eating, and he often resorts to consuming soups like egg drop soup. No vomiting or constipation.  He recalls a history of severe heartburn during a golf trip in August, which he attributes to switching from Nexium  to another medication that was ineffective. He prefers to continue with Nexium  as it has been more effective for him.  He also reports a rash on his foot for which he uses Bactroban  cream, which has been more effective than previous ointments prescribed by a dermatologist.  He has a history of hypertension and takes hydrochlorothiazide  12.5 mg, which he takes as needed due to concerns about stomach irritation.  He experiences vertigo, which has been ongoing for almost a year. He has not been taking meclizine  but has been using herbal remedies. Physical therapy exercises have  helped improve his vertigo, allowing him to resume playing golf.  He has a history of anxiety and occasionally takes Xanax  0.5 mg as needed, with the last dose taken about a month ago. He does not take sertraline  as it was ineffective for him.  He is a smoker, consuming 8 to 10 cigarettes daily, and has undergone annual lung scans due to his smoking history. No chest pain and a history of a CT scan of the head last December, which was normal.    Past Medical History:  Diagnosis Date   Adenomatous colon polyp    Allergy    Anxiety    Arthritis    Cataract    removed bilateral   GERD (gastroesophageal reflux disease)    Graves disease    Per [facility] New Patient Packet   H/O hemorrhoids    H/O hiatal hernia    Heart murmur 2007   per pt history- possible murmur by [facility]- states negative stress test   Hyperlipidemia    Pneumonia    dx 03/08/21   Prostate cancer (HCC) 01/20/2013   Sleep apnea    CPAP.  Trying to use it. (NOT USING CURRENTLY )   DX YEARS AGO   Thyroid disease    graves   Ulcer approx 1970   HX peptic ulcer    Past Surgical History:  Procedure Laterality Date   CATARACT EXTRACTION W/ INTRAOCULAR LENS  IMPLANT, BILATERAL     COLONOSCOPY  2017   LYMPHADENECTOMY Bilateral  03/17/2013   Procedure: LYMPHADENECTOMY PELVIC LYMPH NODE DISSECTION;  Surgeon: Alm GORMAN Fragmin, MD;  Location: [facility];  Service: Urology;  Laterality: Bilateral;   POLYPECTOMY     PROSTATE BIOPSY     ROBOT ASSISTED LAPAROSCOPIC RADICAL PROSTATECTOMY N/A 03/17/2013   Procedure: ROBOTIC ASSISTED LAPAROSCOPIC RADICAL PROSTATECTOMY;  Surgeon: Alm GORMAN Fragmin, MD;  Location: [facility];  Service: Urology;  Laterality: N/A;   TONSILLECTOMY     TOTAL KNEE ARTHROPLASTY Left 10/15/2016   Procedure: TOTAL KNEE ARTHROPLASTY;  Surgeon: Beverley Evalene BIRCH, MD;  Location: [facility];  Service: Orthopedics;  Laterality: Left;   TRANSURETHRAL RESECTION OF PROSTATE  12/01/2012   Dr Fragmin   UPPER GASTROINTESTINAL ENDOSCOPY       Allergies  Allergen Reactions   Bee Venom Swelling    Throat swells    Penicillins Rash and Other (See Comments)    PATIENT HAS HAD A PCN REACTION WITH IMMEDIATE RASH, FACIAL/TONGUE/THROAT SWELLING, SOB, OR LIGHTHEADEDNESS WITH HYPOTENSION:  #  #  #  YES  #  #  #   Has patient had a PCN reaction causing severe rash involving mucus membranes or skin necrosis: No Has patient had a PCN reaction that required hospitalization: No Has patient had a PCN reaction occurring within the last 10 years: No If all of the above answers are NO, then may proceed with Cephalosporin use.    Hydrocodone-Acetaminophen  Nausea And Vomiting    Outpatient Encounter Medications as of 01/13/2024  Medication Sig   albuterol  (VENTOLIN  HFA) 108 (90 Base) MCG/ACT inhaler Inhale 2 puffs into the lungs every 6 (six) hours as needed for wheezing or shortness of breath.   ALPRAZolam  (XANAX ) 1 MG tablet Take 0.5 tablets (0.5 mg total) by mouth daily as needed for anxiety. TAKE 1/2 TABLET BY MOUTH DAILY AS NEEDED   cetirizine (ZYRTEC) 10 MG tablet Take 10 mg by mouth daily.   cholecalciferol (VITAMIN D ) 1000 units tablet Take 1,000 Units by mouth daily.   diclofenac Sodium (VOLTAREN) 1 % GEL APPLY 4 GRAMS TO AFFECTED AREA THREE TIMES A DAY AS NEEDED FOR L KNEE PAIN   EPINEPHrine  0.3 mg/0.3 mL IJ SOAJ injection Inject 0.3 mLs (0.3 mg total) into the muscle once. For allergic reaction to bee stings   esomeprazole  (NEXIUM ) 20 MG capsule Take 1 capsule (20 mg total) by mouth daily at 12 noon.   fluticasone  (FLONASE ) 50 MCG/ACT nasal spray Place 2 sprays into both nostrils daily.   hydrochlorothiazide  (MICROZIDE ) 12.5 MG capsule TAKE 1 CAPSULE(12.5 MG) BY MOUTH DAILY   meclizine  (ANTIVERT ) 25 MG tablet Take 1 tablet (25 mg total) by mouth 2 (two) times daily as needed for dizziness.   mupirocin  ointment (BACTROBAN ) 2 % Apply 1 Application topically 2 (two) times daily.   ondansetron  (ZOFRAN ) 4 MG tablet Take 1 tablet (4  mg total) by mouth every 8 (eight) hours as needed for nausea or vomiting.   vitamin B-12 (CYANOCOBALAMIN ) 1000 MCG tablet Take 1,000 mcg by mouth daily.   vitamin E 100 UNIT capsule Take by mouth daily.   [DISCONTINUED] sucralfate  (CARAFATE ) 1 GM/10ML suspension Take 10 mLs (1 g total) by mouth in the morning, at noon, and at bedtime.   sertraline  (ZOLOFT ) 50 MG tablet Take 1 tablet (50 mg total) by mouth daily. (Patient not taking: Reported on 01/13/2024)   sucralfate  (CARAFATE ) 1 GM/10ML suspension Take 10 mLs (1 g total) by mouth in the morning, at noon, and at bedtime.   [DISCONTINUED] hydrochlorothiazide  (MICROZIDE ) 12.5  MG capsule Take 1 capsule (12.5 mg total) by mouth daily.   Facility-Administered Encounter Medications as of 01/13/2024  Medication   0.9 %  sodium chloride  infusion    Review of Systems:  Review of Systems  Constitutional:  Negative for activity change, appetite change and fever.  HENT:  Negative for sore throat.   Eyes: Negative.   Cardiovascular:  Negative for chest pain and leg swelling.  Gastrointestinal:  Positive for nausea. Negative for abdominal distention, diarrhea and vomiting.  Genitourinary:  Negative for dysuria, frequency and urgency.  Skin:  Negative for color change.  Neurological:  Negative for dizziness and headaches.  Psychiatric/Behavioral:  Negative for behavioral problems and sleep disturbance. The patient is not nervous/anxious.     Health Maintenance  Topic Date Due   Influenza Vaccine  09/26/2023   Colonoscopy  04/04/2024   Medicare Annual Wellness (AWV)  11/06/2024   DTaP/Tdap/Td (2 - Td or Tdap) 01/02/2026   Pneumococcal Vaccine: 50+ Years  Completed   Hepatitis C Screening  Completed   Zoster Vaccines- Shingrix  Completed   Meningococcal B Vaccine  Aged Out   COVID-19 Vaccine  Discontinued    Physical Exam: Vitals:   01/13/24 1251  BP: 126/72  Pulse: 70  Resp: 18  Temp: 97.8 F (36.6 C)  SpO2: 93%  Weight: 173 lb 3.2  oz (78.6 kg)  Height: 5' 6 (1.676 m)   Body mass index is 27.96 kg/m. Physical Exam Constitutional:      Appearance: Normal appearance.  HENT:     Head: Normocephalic and atraumatic.     Mouth/Throat:     Mouth: Mucous membranes are moist.  Eyes:     Conjunctiva/sclera: Conjunctivae normal.  Cardiovascular:     Rate and Rhythm: Normal rate and regular rhythm.     Pulses: Normal pulses.     Heart sounds: Normal heart sounds.  Pulmonary:     Effort: Pulmonary effort is normal.     Breath sounds: Normal breath sounds.  Abdominal:     General: Bowel sounds are normal.     Palpations: Abdomen is soft.  Musculoskeletal:        General: No swelling. Normal range of motion.     Cervical back: Normal range of motion.  Skin:    General: Skin is warm and dry.  Neurological:     General: No focal deficit present.     Mental Status: He is alert and oriented to person, place, and time.  Psychiatric:        Mood and Affect: Mood normal.        Behavior: Behavior normal.        Thought Content: Thought content normal.        Judgment: Judgment normal.     Labs reviewed: Basic Metabolic Panel: Recent Labs    11/06/23 1341 12/17/23 1354 12/26/23 1332  NA 139 135 136  K 3.7 3.5 3.6  CL 100 90* 97*  CO2 29 31 29   GLUCOSE 84 91 93  BUN 15 23 20   CREATININE 1.03 1.50* 1.17  CALCIUM 8.9 9.8 9.1   Liver Function Tests: Recent Labs    09/29/23 1208  AST 27  ALT 15  BILITOT 0.5  PROT 6.4   Recent Labs    12/17/23 1354 12/26/23 1332  LIPASE 36 34   No results for input(s): AMMONIA in the last 8760 hours. CBC: Recent Labs    05/23/23 1608 09/29/23 1208 11/06/23 1341  WBC 4.5 4.0 4.6  NEUTROABS 3,501 2,856 3,482  HGB 11.9* 13.3 13.9  HCT 33.1* 40.0 40.3  MCV 99.4 104.2* 102.5*  PLT 105* 205 190   Lipid Panel: Recent Labs    09/29/23 1208  CHOL 131  HDL 74  LDLCALC 42  TRIG 69  CHOLHDL 1.8   No results found for: HGBA1C  Procedures since last  visit: No results found.  Assessment/Plan  1. Gastroesophageal reflux disease, unspecified whether esophagitis present (Primary) -  Persistent nausea and difficulty eating despite treatment. Negative H. pylori. Normal labs. - Continue Nexium  40 mg twice daily. - Continue sucralfate  liquid, 10 mL three times daily. - Scheduled follow-up with gastroenterologist on December 30th at 10 AM. - Provided three refills for sucralfate  until gastroenterologist appointment. - sucralfate  (CARAFATE ) 1 GM/10ML suspension; Take 10 mLs (1 g total) by mouth in the morning, at noon, and at bedtime.  Dispense: 473 mL; Refill: 4  2. Vertigo -  Chronic vertigo with some relief from previous physical therapy. No ear infection or hearing issues. - Prescribed meclizine  25 mg twice daily as needed for dizziness. - Advised caution when experiencing dizziness. - meclizine  (ANTIVERT ) 25 MG tablet; Take 1 tablet (25 mg total) by mouth 2 (two) times daily as needed for dizziness.  Dispense: 30 tablet; Refill: 0  3. Primary hypertension -  Blood pressure well-controlled with hydrochlorothiazide  as needed. - Continue hydrochlorothiazide  12.5 mg as needed.  4. Anxiety -  Xanax  effective for occasional anxiety without abuse. - Continue Xanax  0.5 mg as needed.  5. Tobacco use -  Continues to smoke 8-10 cigarettes daily. - Encouraged smoking cessation.      Labs/tests ordered:   None   Return if symptoms worsen or fail to improve.  Tarrie Mcmichen Medina-Vargas, NP

## 2024-01-14 NOTE — Telephone Encounter (Signed)
 Patient was seen in office on yesterday and I was able to get in contact with the [facility] on yesterday while patient was in office and we were able to make an appointment with May, Deanna J, NP  on February 24, 2024 at 10 am.   Message sent to Sherren Tobias BIRCH

## 2024-02-13 ENCOUNTER — Other Ambulatory Visit: Payer: Self-pay | Admitting: Nurse Practitioner

## 2024-02-13 DIAGNOSIS — I10 Essential (primary) hypertension: Secondary | ICD-10-CM

## 2024-02-24 ENCOUNTER — Ambulatory Visit: Payer: Self-pay | Admitting: Gastroenterology

## 2024-02-24 ENCOUNTER — Encounter: Payer: Self-pay | Admitting: Gastroenterology

## 2024-02-24 ENCOUNTER — Other Ambulatory Visit (INDEPENDENT_AMBULATORY_CARE_PROVIDER_SITE_OTHER)

## 2024-02-24 ENCOUNTER — Ambulatory Visit: Admitting: Gastroenterology

## 2024-02-24 VITALS — BP 114/68 | HR 50 | Ht 66.0 in | Wt 177.0 lb

## 2024-02-24 DIAGNOSIS — K529 Noninfective gastroenteritis and colitis, unspecified: Secondary | ICD-10-CM

## 2024-02-24 DIAGNOSIS — K219 Gastro-esophageal reflux disease without esophagitis: Secondary | ICD-10-CM

## 2024-02-24 DIAGNOSIS — Z8546 Personal history of malignant neoplasm of prostate: Secondary | ICD-10-CM

## 2024-02-24 DIAGNOSIS — Z8719 Personal history of other diseases of the digestive system: Secondary | ICD-10-CM

## 2024-02-24 DIAGNOSIS — K8020 Calculus of gallbladder without cholecystitis without obstruction: Secondary | ICD-10-CM | POA: Diagnosis not present

## 2024-02-24 DIAGNOSIS — R1310 Dysphagia, unspecified: Secondary | ICD-10-CM | POA: Diagnosis not present

## 2024-02-24 DIAGNOSIS — E063 Autoimmune thyroiditis: Secondary | ICD-10-CM

## 2024-02-24 DIAGNOSIS — Z860101 Personal history of adenomatous and serrated colon polyps: Secondary | ICD-10-CM

## 2024-02-24 DIAGNOSIS — Z860100 Personal history of colon polyps, unspecified: Secondary | ICD-10-CM

## 2024-02-24 DIAGNOSIS — R112 Nausea with vomiting, unspecified: Secondary | ICD-10-CM | POA: Diagnosis not present

## 2024-02-24 DIAGNOSIS — R197 Diarrhea, unspecified: Secondary | ICD-10-CM

## 2024-02-24 DIAGNOSIS — R1319 Other dysphagia: Secondary | ICD-10-CM

## 2024-02-24 DIAGNOSIS — F17210 Nicotine dependence, cigarettes, uncomplicated: Secondary | ICD-10-CM

## 2024-02-24 DIAGNOSIS — R159 Full incontinence of feces: Secondary | ICD-10-CM

## 2024-02-24 DIAGNOSIS — R152 Fecal urgency: Secondary | ICD-10-CM | POA: Diagnosis not present

## 2024-02-24 LAB — HEPATIC FUNCTION PANEL
ALT: 41 U/L (ref 3–53)
AST: 57 U/L — ABNORMAL HIGH (ref 5–37)
Albumin: 4 g/dL (ref 3.5–5.2)
Alkaline Phosphatase: 59 U/L (ref 39–117)
Bilirubin, Direct: 0.3 mg/dL (ref 0.1–0.3)
Total Bilirubin: 0.8 mg/dL (ref 0.2–1.2)
Total Protein: 6.9 g/dL (ref 6.0–8.3)

## 2024-02-24 LAB — HEMOGLOBIN A1C: Hgb A1c MFr Bld: 4.4 % — ABNORMAL LOW (ref 4.6–6.5)

## 2024-02-24 LAB — TSH: TSH: 2.78 u[IU]/mL (ref 0.35–5.50)

## 2024-02-24 LAB — CBC
HCT: 30.4 % — ABNORMAL LOW (ref 39.0–52.0)
Hemoglobin: 10.5 g/dL — ABNORMAL LOW (ref 13.0–17.0)
MCHC: 34.6 g/dL (ref 30.0–36.0)
MCV: 100.8 fl — ABNORMAL HIGH (ref 78.0–100.0)
Platelets: 126 K/uL — ABNORMAL LOW (ref 150.0–400.0)
RBC: 3.02 Mil/uL — ABNORMAL LOW (ref 4.22–5.81)
RDW: 15.4 % (ref 11.5–15.5)
WBC: 2.7 K/uL — ABNORMAL LOW (ref 4.0–10.5)

## 2024-02-24 LAB — C-REACTIVE PROTEIN: CRP: 0.5 mg/dL — ABNORMAL LOW (ref 1.0–20.0)

## 2024-02-24 MED ORDER — ONDANSETRON HCL 4 MG PO TABS: 4.0000 mg | ORAL_TABLET | Freq: Three times a day (TID) | ORAL | 0 refills | Status: AC | PRN

## 2024-02-24 NOTE — Progress Notes (Signed)
 "  Chief Complaint:Nausea, vomiting, GERD Primary GI Doctor: (previously Dr. Aneita) Dr. San  HPI:  Patient is a  78  year old male patient with past medical history of Prostate CA,GERD, and Graves disease, who was referred to me by Caro Harlene POUR, NP on 12/17/23 for a evaluation of nausea, vomiting, GERD .    Interval History Patient presents for evaluation of nausea, vomiting and GERD.  Patient reports nausea and vomiting since Derreon Consalvo. Reports initially the nausea was occasionally, however since September has been occurring more regularly.  Patient has history of GERD and was taking Nexium  20 mg po daily until current symptoms increased. He increased the Nexium  to 40 mg twice daily. Sucralfate  was also added. He has been experiencing persistent acid reflux and nausea despite taking Nexium  40 mg twice daily and sucralfate  liquid 10 mL two times a day. He reports it does help some. Weight stable. Reports appetite was poor initially, but recently improved.  Patient also reports esophageal dysphagia with solids and pills.  Patient was taking naproxen  twice daily for knee arthritis, recently tried to reduce the dose.  Denies black tarry stools. Reports history of duodenal ulcers when he was in the [employer].   Patient also reports having vertigo and taking meclizine  for this.   Patient taking Zofran  4 mg prn , he ran out of prescription.  Patient also reports diarrhea with urgency incontinence. He has 2-3 BM's per day. Reports this started around [place] as well. No abdominal pain.   He reports he was drinking 4-6 oz of Bourbon daily , he decreased consumption due to current symptoms because he felt it made the reflux worse. He is a smoker, consuming 8 to 10 cigarettes daily.  Patient not on blood thinners.  Surgical history: Total knee, laparoscopic radical prostatectomy  Patient's family history includes: personal hx of prostate CA, no colon CA, no esophageal CA  Wt Readings from Last  3 Encounters:  02/24/24 177 lb (80.3 kg)  01/13/24 173 lb 3.2 oz (78.6 kg)  12/17/23 167 lb 9.6 oz (76 kg)    Past Medical History:  Diagnosis Date   Adenomatous colon polyp    Allergy    Anxiety    Arthritis    Cataract    removed bilateral   GERD (gastroesophageal reflux disease)    Graves disease    Per [facility] New Patient Packet   H/O hemorrhoids    H/O hiatal hernia    Heart murmur 2007   per pt history- possible murmur by PCp- states negative stress test   Hyperlipidemia    Pneumonia    dx 03/08/21   Prostate cancer (HCC) 01/20/2013   Sleep apnea    CPAP.  Trying to use it. (NOT USING CURRENTLY )   DX YEARS AGO   Thyroid disease    graves   Ulcer approx 1970   HX peptic ulcer    Past Surgical History:  Procedure Laterality Date   CATARACT EXTRACTION W/ INTRAOCULAR LENS  IMPLANT, BILATERAL     COLONOSCOPY  2017   LYMPHADENECTOMY Bilateral 03/17/2013   Procedure: LYMPHADENECTOMY PELVIC LYMPH NODE DISSECTION;  Surgeon: Alm GORMAN Fragmin, MD;  Location: [facility];  Service: Urology;  Laterality: Bilateral;   POLYPECTOMY     PROSTATE BIOPSY     ROBOT ASSISTED LAPAROSCOPIC RADICAL PROSTATECTOMY N/A 03/17/2013   Procedure: ROBOTIC ASSISTED LAPAROSCOPIC RADICAL PROSTATECTOMY;  Surgeon: Alm GORMAN Fragmin, MD;  Location: [facility];  Service: Urology;  Laterality: N/A;   TONSILLECTOMY  TOTAL KNEE ARTHROPLASTY Left 10/15/2016   Procedure: TOTAL KNEE ARTHROPLASTY;  Surgeon: Beverley Evalene BIRCH, MD;  Location: [facility] OR;  Service: Orthopedics;  Laterality: Left;   TRANSURETHRAL RESECTION OF PROSTATE  12/01/2012   Dr Alline   UPPER GASTROINTESTINAL ENDOSCOPY      Current Outpatient Medications  Medication Sig Dispense Refill   albuterol  (VENTOLIN  HFA) 108 (90 Base) MCG/ACT inhaler Inhale 2 puffs into the lungs every 6 (six) hours as needed for wheezing or shortness of breath. 8 g 0   ALPRAZolam  (XANAX ) 1 MG tablet Take 0.5 tablets (0.5 mg total) by mouth daily as needed for anxiety. TAKE 1/2  TABLET BY MOUTH DAILY AS NEEDED 15 tablet 0   cetirizine (ZYRTEC) 10 MG tablet Take 10 mg by mouth daily.     cholecalciferol (VITAMIN D ) 1000 units tablet Take 1,000 Units by mouth daily.     diclofenac Sodium (VOLTAREN) 1 % GEL APPLY 4 GRAMS TO AFFECTED AREA THREE TIMES A DAY AS NEEDED FOR L KNEE PAIN     EPINEPHrine  0.3 mg/0.3 mL IJ SOAJ injection Inject 0.3 mLs (0.3 mg total) into the muscle once. For allergic reaction to bee stings 1 Device 3   esomeprazole  (NEXIUM ) 20 MG capsule Take 1 capsule (20 mg total) by mouth daily at 12 noon.     fluticasone  (FLONASE ) 50 MCG/ACT nasal spray Place 2 sprays into both nostrils daily. 16 g 6   hydrochlorothiazide  (MICROZIDE ) 12.5 MG capsule TAKE 1 CAPSULE(12.5 MG) BY MOUTH DAILY 90 capsule 0   meclizine  (ANTIVERT ) 25 MG tablet Take 1 tablet (25 mg total) by mouth 2 (two) times daily as needed for dizziness. 30 tablet 0   mupirocin  ointment (BACTROBAN ) 2 % Apply 1 Application topically 2 (two) times daily. 22 g 1   sucralfate  (CARAFATE ) 1 GM/10ML suspension Take 10 mLs (1 g total) by mouth in the morning, at noon, and at bedtime. 473 mL 4   vitamin B-12 (CYANOCOBALAMIN ) 1000 MCG tablet Take 1,000 mcg by mouth daily.     vitamin E 100 UNIT capsule Take by mouth daily.     ondansetron  (ZOFRAN ) 4 MG tablet Take 1 tablet (4 mg total) by mouth every 8 (eight) hours as needed for nausea or vomiting. 20 tablet 0   sertraline  (ZOLOFT ) 50 MG tablet Take 1 tablet (50 mg total) by mouth daily. (Patient not taking: Reported on 02/24/2024) 30 tablet 1   Current Facility-Administered Medications  Medication Dose Route Frequency Provider Last Rate Last Admin   0.9 %  sodium chloride  infusion  500 mL Intravenous Continuous Aneita Gwendlyn DASEN, MD        Allergies as of 02/24/2024 - Review Complete 02/24/2024  Allergen Reaction Noted   Bee venom Swelling 11/02/2013   Penicillins Rash and Other (See Comments) 11/02/2010   Hydrocodone-acetaminophen  Nausea And Vomiting  03/17/2013    Family History  Problem Relation Age of Onset   Cancer Mother        brain   Cancer Brother        Question lung   Drug abuse Maternal Aunt    Colon polyps Neg Hx    Colon cancer Neg Hx    Rectal cancer Neg Hx    Stomach cancer Neg Hx    Esophageal cancer Neg Hx     Review of Systems:    Constitutional: No weight loss, fever, chills, weakness or fatigue HEENT: Eyes: No change in vision  Ears, Nose, Throat:  No change in hearing or congestion Skin: No rash or itching Cardiovascular: No chest pain, chest pressure or palpitations   Respiratory: No SOB or cough Gastrointestinal: See HPI and otherwise negative Genitourinary: No dysuria or change in urinary frequency Neurological: No headache, dizziness or syncope Musculoskeletal: No new muscle or joint pain Hematologic: No bleeding or bruising Psychiatric: No history of depression or anxiety    Physical Exam:  Vital signs: BP 114/68   Pulse (!) 50   Ht 5' 6 (1.676 m)   Wt 177 lb (80.3 kg)   BMI 28.57 kg/m   Constitutional:   Pleasant male appears to be in NAD, Well developed, Well nourished, alert and cooperative Eyes:   PEERL, EOMI. No icterus. Conjunctiva pink. Neck:  Supple Throat: Oral cavity and pharynx without inflammation, swelling or lesion.  Respiratory: Respirations even and unlabored. Lungs clear to auscultation bilaterally.   No wheezes, crackles, or rhonchi.  Cardiovascular: Normal S1, S2. Regular rate and rhythm. No peripheral edema, cyanosis or pallor.  Gastrointestinal:  Soft, nondistended, nontender. No rebound or guarding. Normal bowel sounds. No appreciable masses or hepatomegaly. Rectal:  Not performed.  Msk:  uses cane Neurologic:  Alert and  oriented x4;  grossly normal neurologically.  Skin:   Dry and intact without significant lesions or rashes.  RELEVANT LABS AND IMAGING: CBC    Latest Ref Rng & Units 11/06/2023    1:41 PM 09/29/2023   12:08 PM 05/23/2023    4:08  PM  CBC  WBC 3.8 - 10.8 Thousand/uL 4.6  4.0  4.5   Hemoglobin 13.2 - 17.1 g/dL 86.0  86.6  88.0   Hematocrit 38.5 - 50.0 % 40.3  40.0  33.1   Platelets 140 - 400 Thousand/uL 190  205  105      CMP     Latest Ref Rng & Units 12/26/2023    1:32 PM 12/17/2023    1:54 PM 11/06/2023    1:41 PM  CMP  Glucose 65 - 139 mg/dL 93  91  84   BUN 7 - 25 mg/dL 20  23  15    Creatinine 0.70 - 1.28 mg/dL 8.82  8.49  8.96   Sodium 135 - 146 mmol/L 136  135  139   Potassium 3.5 - 5.3 mmol/L 3.6  3.5  3.7   Chloride 98 - 110 mmol/L 97  90  100   CO2 20 - 32 mmol/L 29  31  29    Calcium 8.6 - 10.3 mg/dL 9.1  9.8  8.9      Lab Results  Component Value Date   TSH 3.49 03/19/2021   Lab Results  Component Value Date   LIPASE 34 12/26/2023   Lab Results  Component Value Date   IRON 89 09/29/2023   TIBC 287 09/29/2023   FERRITIN 161 09/29/2023   10/2023 cdiff culture negative  12/22/23 H pylori negative  01/2023 CTAP IMPRESSION: 1. No acute intra-abdominal or pelvic finding by CT. 2. No renal or urinary tract abnormality to account for hematuria. Similar nonspecific chronic bladder wall thickening. 3. Chronic stable small left pelvic sidewall fluid collection favored to be lymphocele as before. 4. Cholelithiasis. 5. Remote prostatectomy. 6. Similar chronic bibasilar interstitial lung disease. 7. Aortic atherosclerosis.   GI Procedures: 03/2021 colonoscopy, recall 3 years - Ten 5 to 8 mm polyps in the sigmoid colon, in the descending colon, in the transverse colon and in the ascending colon, removed with a cold snare. Resected  and retrieved. - Internal hemorrhoids.The examination was otherwise normal on direct and retroflexion views. Path: Surgical [P], colon, descending, transverse, and ascending, sigmoid, polyp (10) - TUBULAR ADENOMA(S) WITHOUT HIGH-GRADE DYSPLASIA OR MALIGNANCY - HYPERPLASTIC POLYP(S)  10/2015 colonoscopy - Thirteen 4 to 6 mm polyps in the sigmoid colon and in the  descending colon, removed with a cold snare. Resected and retrieved. - Internal hemorrhoids. Path: Surgical [P], descending, sigmoid, polyp (11) - TUBULAR ADENOMA AND MULTIPLE HYPERPLASTIC POLYPS. - NO HIGH GRADE DYSPLASIA OR INVASIVE MALIGNANCY IDENTIFIED.  10/2010 colonoscopy 5 mm sessile polyp in the mid transverse colon 4 mm sessile polyp in the descending colon 4 to 5 mm 2 polyps in the sigmoid colon Internal hemorrhoids  01/2005 colonoscopy 2 mm sessile polyp in the transverse colon 4 mm sessile polyp in the sigmoid colon Internal small hemorrhoids Path Colon polyp hyperplastic polyp, no adenomatous change or malignancy identified  03/2002 EGD Normal proximal esophagus to mid esophagus Stricture in distal esophagus Hiatal hernia 3 cm in length Normal body to fundus Mucosal abnormality in the duodenal bulb erythematous mucosa Mucosal abnormality Atrium to pyloric sphincter.  Erythematous mucosa Path Esophogastric junction mucosa with mild inflammation no intestinal metaplasia or dysplasia or malignancy identified  Assessment:  #1 GERD, uncontrolled with eso dysphagia- on PPI therapy twice daily.  History of esophageal stricture on endoscopy back in February 2004. Neg H pylori.   #2 Nausea/Vomiting, history of NSAID use, history of duodenal ulcer, also has issues with vertigo. Hx of hashimoto's- no recent TSH level.  #3 Cholelithiasis, seen on CTAP 01/2023  #4 Diarrhea with urgency incontinence. Neg cdiff. Hx of prostate CA w/ radiation.  Last colonoscopy 03/2021 with 10 polyps.   #5 History of tubular adenomas, Last colonoscopy 03/2021 with 10 polyps.   #6 history of prostate CA (2014), received radiation   Plan: -continue Nexium  40 mg twice daily -Offered switching PPI therapy, patient would like to hold off for now -continue sucralfate  prn -Recommend GERD diet , no late meals -continue ondansetron  prn, refilled -Add OTC Citrucel 1 tsp po daily  -NO NSAIDs  (naproxen ) - Check CBC, hepatic function panel, Ha1c, TSH,CRP, TTG IgA, IgA -RUQ abdominal ultrasound to evaluate gallbladder  - Order GI profile stool test -Schedule EGD in [facility] with Dr. San. The risks and benefits of EGD with possible biopsies and esophageal dilation were discussed with the patient who agrees to proceed. -Schedule for a colonoscopy in [facility] with Dr. San. The risks and benefits of colonoscopy with possible polypectomy / biopsies were discussed and the patient agrees to proceed.    Thank you for the courtesy of this consult. Please call me with any questions or concerns.   Selden Noteboom, FNP-C [facility] 02/24/2024, 12:19 PM  Cc: Caro Harlene POUR, NP  "

## 2024-02-24 NOTE — Patient Instructions (Signed)
 GERD  Recommend GERD diet Continue nexium  40 mg twice daily Continue carafate  as needed  Dysphagia Handout on how to prevent choking Take your time eating   Nausea Avoid NSAIDs (naproxen ) Can take ondansetron  as needed, refilled  Diarrhea Avoid food triggers Add OTC Citrucel 1 tsp po daily   _______________________________________________________  If your blood pressure at your visit was 140/90 or greater, please contact your primary care physician to follow up on this.  _______________________________________________________  If you are age 52 or older, your body mass index should be between 23-30. Your Body mass index is 28.57 kg/m. If this is out of the aforementioned range listed, please consider follow up with your Primary Care Provider.  If you are age 56 or younger, your body mass index should be between 19-25. Your Body mass index is 28.57 kg/m. If this is out of the aformentioned range listed, please consider follow up with your Primary Care Provider.   ________________________________________________________  The Normal [facility] providers would like to encourage you to use MYCHART to communicate with providers for non-urgent requests or questions.  Due to long hold times on the telephone, sending your provider a message by [facility] may be a faster and more efficient way to get a response.  Please allow 48 business hours for a response.  Please remember that this is for non-urgent requests.  _______________________________________________________  [facility] is using a team-based approach to care.  Your team is made up of your doctor and two to three APPS. Our APPS (Nurse Practitioners and Physician Assistants) work with your physician to ensure care continuity for you. They are fully qualified to address your health concerns and develop a treatment plan. They communicate directly with your gastroenterologist to care for you. Seeing the Advanced Practice  Practitioners on your physician's team can help you by facilitating care more promptly, often allowing for earlier appointments, access to diagnostic testing, procedures, and other specialty referrals.   Your provider has requested that you go to the basement level for lab work before leaving today. Press B on the elevator. The lab is located at the first door on the left as you exit the elevator.  You have been scheduled for an abdominal ultrasound at [facility] (1st floor of hospital) on 03-03-24 at 9am. Please arrive 30 minutes prior to your appointment for registration. Make certain not to have anything to eat or drink 6 hours prior to your appointment. Should you need to reschedule your appointment, please contact radiology at [phone]. This test typically takes about 30 minutes to perform.  It has been recommended to you by your physician that you have an endoscopy and colonoscopy completed. Per your request, we did not schedule the procedure(s) today. Please contact our office at [phone] should you decide to have the procedure completed. You will be scheduled for a pre-visit and procedure at that time.  Due to recent changes in healthcare laws, you may see the results of your imaging and laboratory studies on MyChart before your provider has had a chance to review them.  We understand that in some cases there may be results that are confusing or concerning to you. Not all laboratory results come back in the same time frame and the provider may be waiting for multiple results in order to interpret others.  Please give us  48 hours in order for your provider to thoroughly review all the results before contacting the office for clarification of your results.

## 2024-02-25 LAB — TISSUE TRANSGLUTAMINASE, IGA: (tTG) Ab, IgA: <1 U/mL

## 2024-02-25 LAB — IGA: Immunoglobulin A: 399 mg/dL — ABNORMAL HIGH (ref 70–320)

## 2024-02-25 NOTE — Progress Notes (Signed)
 Agree with the assessment and plan as outlined by [facility], FNP-C.  Carlitos Bottino, DO, [facility]

## 2024-03-02 ENCOUNTER — Telehealth: Payer: Self-pay | Admitting: Gastroenterology

## 2024-03-02 DIAGNOSIS — R112 Nausea with vomiting, unspecified: Secondary | ICD-10-CM

## 2024-03-02 DIAGNOSIS — Z8719 Personal history of other diseases of the digestive system: Secondary | ICD-10-CM

## 2024-03-02 DIAGNOSIS — Z860100 Personal history of colon polyps, unspecified: Secondary | ICD-10-CM

## 2024-03-02 DIAGNOSIS — K529 Noninfective gastroenteritis and colitis, unspecified: Secondary | ICD-10-CM

## 2024-03-02 DIAGNOSIS — R1319 Other dysphagia: Secondary | ICD-10-CM

## 2024-03-02 DIAGNOSIS — K219 Gastro-esophageal reflux disease without esophagitis: Secondary | ICD-10-CM

## 2024-03-02 MED ORDER — NA SULFATE-K SULFATE-MG SULF 17.5-3.13-1.6 GM/177ML PO SOLN: 1.0000 | ORAL | 0 refills | Status: DC

## 2024-03-02 NOTE — Telephone Encounter (Signed)
 Left message on voicemail that Suprep has been sent to [facility] in [place].  Also, made patient aware that patient instructions have been mailed to his home.  Advised to return call with any further questions or concerns.

## 2024-03-02 NOTE — Telephone Encounter (Signed)
 Patient came into the office today to schedule his EGD/Colon with Dr. San.  He is scheduled for 03/18/24 at 12:30 p.m.  Please provide patient with instructions and prep. Thank you.

## 2024-03-03 ENCOUNTER — Ambulatory Visit (HOSPITAL_COMMUNITY)
Admission: RE | Admit: 2024-03-03 | Discharge: 2024-03-03 | Disposition: A | Discharge status: Home / Self Care | Source: Ambulatory Visit | Attending: Gastroenterology | Admitting: Gastroenterology

## 2024-03-03 DIAGNOSIS — R112 Nausea with vomiting, unspecified: Secondary | ICD-10-CM | POA: Diagnosis present

## 2024-03-03 DIAGNOSIS — K8020 Calculus of gallbladder without cholecystitis without obstruction: Secondary | ICD-10-CM | POA: Diagnosis present

## 2024-03-05 NOTE — Telephone Encounter (Signed)
-----   Message from [facility], NP sent at 02/27/2024  4:57 PM EST ----- Karna let the patient know her lab work is as follows: Thyroid  level is normal Iliac panel is negative His A1c is 4.4 indicating nondiabetic CRP low indicating minimal inflammation Liver function shows slightly elevated AST His platelets are low at 126 His hemoglobin has dropped from 13.9-10.5 indicating blood loss His white blood count is low indicating increased risk of infection  We can further evaluate some of these issues with the procedure scheduled  Deanna, NP

## 2024-03-11 ENCOUNTER — Encounter: Payer: Self-pay | Admitting: Gastroenterology

## 2024-03-16 ENCOUNTER — Other Ambulatory Visit

## 2024-03-16 DIAGNOSIS — R197 Diarrhea, unspecified: Secondary | ICD-10-CM

## 2024-03-16 NOTE — Telephone Encounter (Signed)
-----   Message from [facility], NP sent at 03/05/2024 11:27 AM EST ----- Karna- Let patient know  Abdominal ultrasound shows multiple gallstones in the gallbladder. Also shows fatty liver with some scar tissue.  I would recommend you go ahead and proceed with the endoscopic procedures.  If there is no found cause of your current symptoms we will refer you to general surgery to discuss gall stones.  Deanna, NP

## 2024-03-17 LAB — GI PROFILE, STOOL, PCR

## 2024-03-18 ENCOUNTER — Ambulatory Visit: Admitting: Gastroenterology

## 2024-03-18 ENCOUNTER — Encounter: Payer: Self-pay | Admitting: Gastroenterology

## 2024-03-18 VITALS — BP 114/59 | HR 57 | Temp 97.3°F | Resp 16 | Ht 66.0 in | Wt 177.0 lb

## 2024-03-18 DIAGNOSIS — D125 Benign neoplasm of sigmoid colon: Secondary | ICD-10-CM | POA: Diagnosis not present

## 2024-03-18 DIAGNOSIS — K2950 Unspecified chronic gastritis without bleeding: Secondary | ICD-10-CM | POA: Diagnosis not present

## 2024-03-18 DIAGNOSIS — Z1211 Encounter for screening for malignant neoplasm of colon: Secondary | ICD-10-CM

## 2024-03-18 DIAGNOSIS — K219 Gastro-esophageal reflux disease without esophagitis: Secondary | ICD-10-CM | POA: Diagnosis not present

## 2024-03-18 DIAGNOSIS — R197 Diarrhea, unspecified: Secondary | ICD-10-CM | POA: Diagnosis not present

## 2024-03-18 DIAGNOSIS — R1310 Dysphagia, unspecified: Secondary | ICD-10-CM

## 2024-03-18 DIAGNOSIS — D120 Benign neoplasm of cecum: Secondary | ICD-10-CM | POA: Diagnosis not present

## 2024-03-18 DIAGNOSIS — Z860100 Personal history of colon polyps, unspecified: Secondary | ICD-10-CM

## 2024-03-18 DIAGNOSIS — D123 Benign neoplasm of transverse colon: Secondary | ICD-10-CM | POA: Diagnosis not present

## 2024-03-18 DIAGNOSIS — R1319 Other dysphagia: Secondary | ICD-10-CM

## 2024-03-18 DIAGNOSIS — Z8719 Personal history of other diseases of the digestive system: Secondary | ICD-10-CM

## 2024-03-18 MED ORDER — SODIUM CHLORIDE 0.9 % IV SOLN: 500.0000 mL | INTRAVENOUS | Status: DC

## 2024-03-18 NOTE — Patient Instructions (Signed)
 Handouts provided about esophageal stricture, dilation diet and polyps.   Soft diet today then advance slowly as tolerated tomorrow.  Continue present medications.  Await pathology results.  Repeat colonoscopy for surveillance based on pathology results.  Return to GI clinic PRN.    YOU HAD AN ENDOSCOPIC PROCEDURE TODAY AT THE [facility]:   Refer to the procedure report that was given to you for any specific questions about what was found during the examination.  If the procedure report does not answer your questions, please call your gastroenterologist to clarify.  If you requested that your care partner not be given the details of your procedure findings, then the procedure report has been included in a sealed envelope for you to review at your convenience later.  YOU SHOULD EXPECT: Some feelings of bloating in the abdomen. Passage of more gas than usual.  Walking can help get rid of the air that was put into your GI tract during the procedure and reduce the bloating. If you had a lower endoscopy (such as a colonoscopy or flexible sigmoidoscopy) you may notice spotting of blood in your stool or on the toilet paper. If you underwent a bowel prep for your procedure, you may not have a normal bowel movement for a few days.  Please Note:  You might notice some irritation and congestion in your nose or some drainage.  This is from the oxygen used during your procedure.  There is no need for concern and it should clear up in a day or so.  SYMPTOMS TO REPORT IMMEDIATELY:  Following lower endoscopy (colonoscopy or flexible sigmoidoscopy):  Excessive amounts of blood in the stool  Significant tenderness or worsening of abdominal pains  Swelling of the abdomen that is new, acute  Fever of 100F or higher  Following upper endoscopy (EGD)  Vomiting of blood or coffee ground material  New chest pain or pain under the shoulder blades  Painful or persistently difficult swallowing  New  shortness of breath  Fever of 100F or higher  Black, tarry-looking stools  For urgent or emergent issues, a gastroenterologist can be reached at any hour by calling [phone]. Do not use MyChart messaging for urgent concerns.    DIET:  We do recommend a small meal at first, but then you may proceed to your regular diet.  Drink plenty of fluids but you should avoid alcoholic beverages for 24 hours.  ACTIVITY:  You should plan to take it easy for the rest of today and you should NOT DRIVE or use heavy machinery until tomorrow (because of the sedation medicines used during the test).    FOLLOW UP: Our staff will call the number listed on your records the next business day following your procedure.  We will call around 7:15- 8:00 am to check on you and address any questions or concerns that you may have regarding the information given to you following your procedure. If we do not reach you, we will leave a message.     If any biopsies were taken you will be contacted by phone or by letter within the next 1-3 weeks.  Please call us  at [phone] if you have not heard about the biopsies in 3 weeks.    SIGNATURES/CONFIDENTIALITY: You and/or your care partner have signed paperwork which will be entered into your electronic medical record.  These signatures attest to the fact that that the information above on your After Visit Summary has been reviewed and is understood.  Full responsibility of the confidentiality of this discharge information lies with you and/or your care-partner.

## 2024-03-18 NOTE — Progress Notes (Signed)
 To pacu, VSS. Report to [email or web address]

## 2024-03-18 NOTE — Progress Notes (Signed)
 Called to room to assist during endoscopic procedure.  Patient ID and intended procedure confirmed with present staff. Received instructions for my participation in the procedure from the performing physician.

## 2024-03-18 NOTE — Op Note (Signed)
 [facility] Patient Name: Hunter Porter Procedure Date: 03/18/2024 1:26 PM MRN: [id number] Endoscopist: Sandor Flatter , MD, [id number] Age: 79 Referring MD:  Date of Birth: 06/04/1945 Gender: Male Account #: [id number] Procedure:                Upper GI endoscopy Indications:              Dysphagia, Esophageal reflux, Nausea with vomiting Medicines:                Monitored Anesthesia Care Procedure:                Pre-Anesthesia Assessment:                           - Prior to the procedure, a History and Physical                            was performed, and patient medications and                            allergies were reviewed. The patient's tolerance of                            previous anesthesia was also reviewed. The risks                            and benefits of the procedure and the sedation                            options and risks were discussed with the patient.                            All questions were answered, and informed consent                            was obtained. Prior Anticoagulants: The patient has                            taken no anticoagulant or antiplatelet agents. ASA                            Grade Assessment: II - A patient with mild systemic                            disease. After reviewing the risks and benefits,                            the patient was deemed in satisfactory condition to                            undergo the procedure.                           After obtaining informed consent, the endoscope was  passed under direct vision. Throughout the                            procedure, the patient's blood pressure, pulse, and                            oxygen saturations were monitored continuously. The                            Olympus Scope SN [id number] was introduced through the                            mouth, and advanced to the second part of duodenum.                             The upper GI endoscopy was accomplished without                            difficulty. The patient tolerated the procedure                            well. Scope In: Scope Out: Findings:                 The examined esophagus was normal. A guidewire was                            placed and the scope was withdrawn. Dilation was                            performed with a Savary dilator with mild                            resistance at 17 mm. The dilation site was examined                            following endoscope reinsertion and showed mild                            mucosal disruption. Estimated blood loss was                            minimal.                           The Z-line was regular and was found 41 cm from the                            incisors.                           Localized mild inflammation characterized by                            erythema was found in the gastric antrum. Biopsies  were taken with a cold forceps for Helicobacter                            pylori testing. Estimated blood loss was minimal.                           The examined duodenum was normal. Complications:            No immediate complications. Estimated Blood Loss:     Estimated blood loss was minimal. Impression:               - Normal esophagus. Dilated with 17 mm Savary                            dilator with appropriate mucosal disruption in the                            upper esophagus, consistent with successful                            dilation.                           - Z-line regular, 41 cm from the incisors.                           - Mild, non-ulcer antral gastritis. Biopsied.                           - Normal examined duodenum. Recommendation:           - Patient has a contact number available for                            emergencies. The signs and symptoms of potential                            delayed complications were discussed  with the                            patient. Return to normal activities tomorrow.                            Written discharge instructions were provided to the                            patient.                           - Soft diet today then advance slowly as tolerated                            tomorrow.                           - Continue present medications.                           -  Await pathology results. Sandor Flatter, MD 03/18/2024 2:39:24 PM

## 2024-03-18 NOTE — Progress Notes (Signed)
 "   GASTROENTEROLOGY PROCEDURE H&P NOTE   Primary Care Physician: Caro Harlene POUR, NP    Reason for Procedure:   GERD, nausea/vomiting, dysphagia, diarrhea, change in stools, hx of colon polyps  Plan:    EGD, colonoscopy  Patient is appropriate for endoscopic procedure(s) in the ambulatory ([facility]) setting.  The nature of the procedure, as well as the risks, benefits, and alternatives were carefully and thoroughly reviewed with the patient. Ample time for discussion and questions allowed. The patient understood, was satisfied, and agreed to proceed. I personally addressed all patient questions and concerns.     HPI: Hunter Porter is a 79 y.o. male who presents for EGD and colonoscopy for evaluation of GERD, dysphagia, nausea/vomiting, diarrhea along with polyp surveillance .  Patient was most recently seen in the Gastroenterology Clinic on 02/24/2024.  No interval change in medical history since that appointment. Please refer to that note for full details regarding GI history and clinical presentation.   Past Medical History:  Diagnosis Date   Adenomatous colon polyp    Allergy    Anxiety    Arthritis    Cataract    removed bilateral   GERD (gastroesophageal reflux disease)    Graves disease    Per [facility] New Patient Packet   H/O hemorrhoids    H/O hiatal hernia    Heart murmur 2007   per pt history- possible murmur by [facility]- states negative stress test   Hyperlipidemia    Pneumonia    dx 03/08/21   Prostate cancer (HCC) 01/20/2013   Sleep apnea    CPAP.  Trying to use it. (NOT USING CURRENTLY )   DX YEARS AGO   Thyroid  disease    graves   Ulcer approx 1970   HX peptic ulcer    Past Surgical History:  Procedure Laterality Date   CATARACT EXTRACTION W/ INTRAOCULAR LENS  IMPLANT, BILATERAL     COLONOSCOPY  2017   LYMPHADENECTOMY Bilateral 03/17/2013   Procedure: LYMPHADENECTOMY PELVIC LYMPH NODE DISSECTION;  Surgeon: Alm GORMAN Fragmin, MD;  Location: [facility];  Service:  Urology;  Laterality: Bilateral;   POLYPECTOMY     PROSTATE BIOPSY     ROBOT ASSISTED LAPAROSCOPIC RADICAL PROSTATECTOMY N/A 03/17/2013   Procedure: ROBOTIC ASSISTED LAPAROSCOPIC RADICAL PROSTATECTOMY;  Surgeon: Alm GORMAN Fragmin, MD;  Location: [facility];  Service: Urology;  Laterality: N/A;   TONSILLECTOMY     TOTAL KNEE ARTHROPLASTY Left 10/15/2016   Procedure: TOTAL KNEE ARTHROPLASTY;  Surgeon: Beverley Evalene BIRCH, MD;  Location: [facility] OR;  Service: Orthopedics;  Laterality: Left;   TRANSURETHRAL RESECTION OF PROSTATE  12/01/2012   Dr Fragmin   UPPER GASTROINTESTINAL ENDOSCOPY      Prior to Admission medications  Medication Sig Start Date End Date Taking? Authorizing Provider  ALPRAZolam  (XANAX ) 1 MG tablet Take 0.5 tablets (0.5 mg total) by mouth daily as needed for anxiety. TAKE 1/2 TABLET BY MOUTH DAILY AS NEEDED 09/29/23  Yes Eubanks, Jessica K, NP  cetirizine (ZYRTEC) 10 MG tablet Take 10 mg by mouth daily.   Yes [provider]  cholecalciferol (VITAMIN D ) 1000 units tablet Take 1,000 Units by mouth daily.   Yes [provider]  diclofenac Sodium (VOLTAREN) 1 % GEL APPLY 4 GRAMS TO AFFECTED AREA THREE TIMES A DAY AS NEEDED FOR L KNEE PAIN 09/06/20  Yes [provider]  esomeprazole  (NEXIUM ) 20 MG capsule Take 1 capsule (20 mg total) by mouth daily at 12 noon. 11/06/23  Yes Medina-Vargas,  Monina C, NP  fluticasone  (FLONASE ) 50 MCG/ACT nasal spray Place 2 sprays into both nostrils daily. 01/21/23  Yes Sherlynn Madden, MD  mupirocin  ointment (BACTROBAN ) 2 % Apply 1 Application topically 2 (two) times daily. 09/29/23  Yes Caro Harlene POUR, NP  ondansetron  (ZOFRAN ) 4 MG tablet Take 1 tablet (4 mg total) by mouth every 8 (eight) hours as needed for nausea or vomiting. 02/24/24  Yes May, Deanna J, NP  vitamin B-12 (CYANOCOBALAMIN ) 1000 MCG tablet Take 1,000 mcg by mouth daily.   Yes [provider]  vitamin E 100 UNIT capsule Take by mouth daily.   Yes [provider]  albuterol  (VENTOLIN  HFA) 108 (90 Base) MCG/ACT inhaler Inhale 2 puffs into the lungs every 6 (six) hours as needed for wheezing or shortness of breath. 11/18/22   Caro Harlene POUR, NP  EPINEPHrine  0.3 mg/0.3 mL IJ SOAJ injection Inject 0.3 mLs (0.3 mg total) into the muscle once. For allergic reaction to bee stings 11/02/13   Bari Theodoro FALCON, MD  hydrochlorothiazide  (MICROZIDE ) 12.5 MG capsule TAKE 1 CAPSULE(12.5 MG) BY MOUTH DAILY 02/13/24   Eubanks, Jessica K, NP  meclizine  (ANTIVERT ) 25 MG tablet Take 1 tablet (25 mg total) by mouth 2 (two) times daily as needed for dizziness. 01/13/24   Medina-Vargas, Monina C, NP  sertraline  (ZOLOFT ) 50 MG tablet Take 1 tablet (50 mg total) by mouth daily. Patient not taking: No sig reported 09/30/23   Caro Harlene POUR, NP  sucralfate  (CARAFATE ) 1 GM/10ML suspension Take 10 mLs (1 g total) by mouth in the morning, at noon, and at bedtime. Patient not taking: Reported on 03/18/2024 01/13/24   Medina-Vargas, Monina C, NP    Current Outpatient Medications  Medication Sig Dispense Refill   ALPRAZolam  (XANAX ) 1 MG tablet Take 0.5 tablets (0.5 mg total) by mouth daily as needed for anxiety. TAKE 1/2 TABLET BY MOUTH DAILY AS NEEDED 15 tablet 0   cetirizine (ZYRTEC) 10 MG tablet Take 10 mg by mouth daily.     cholecalciferol (VITAMIN D ) 1000 units tablet Take 1,000 Units by mouth daily.     diclofenac Sodium (VOLTAREN) 1 % GEL APPLY 4 GRAMS TO AFFECTED AREA THREE TIMES A DAY AS NEEDED FOR L KNEE PAIN     esomeprazole  (NEXIUM ) 20 MG capsule Take 1 capsule (20 mg total) by mouth daily at 12 noon.     fluticasone  (FLONASE ) 50 MCG/ACT nasal spray Place 2 sprays into both nostrils daily. 16 g 6   mupirocin  ointment (BACTROBAN ) 2 % Apply 1 Application topically 2 (two) times daily. 22 g 1   ondansetron  (ZOFRAN ) 4 MG tablet Take 1 tablet (4 mg total) by mouth every 8 (eight) hours as needed for nausea or vomiting. 20 tablet 0   vitamin B-12  (CYANOCOBALAMIN ) 1000 MCG tablet Take 1,000 mcg by mouth daily.     vitamin E 100 UNIT capsule Take by mouth daily.     albuterol  (VENTOLIN  HFA) 108 (90 Base) MCG/ACT inhaler Inhale 2 puffs into the lungs every 6 (six) hours as needed for wheezing or shortness of breath. 8 g 0   EPINEPHrine  0.3 mg/0.3 mL IJ SOAJ injection Inject 0.3 mLs (0.3 mg total) into the muscle once. For allergic reaction to bee stings 1 Device 3   hydrochlorothiazide  (MICROZIDE ) 12.5 MG capsule TAKE 1 CAPSULE(12.5 MG) BY MOUTH DAILY 90 capsule 0   meclizine  (ANTIVERT ) 25 MG tablet Take 1 tablet (25 mg total) by mouth 2 (two) times daily as needed for  dizziness. 30 tablet 0   sertraline  (ZOLOFT ) 50 MG tablet Take 1 tablet (50 mg total) by mouth daily. (Patient not taking: No sig reported) 30 tablet 1   sucralfate  (CARAFATE ) 1 GM/10ML suspension Take 10 mLs (1 g total) by mouth in the morning, at noon, and at bedtime. (Patient not taking: Reported on 03/18/2024) 473 mL 4   Current Facility-Administered Medications  Medication Dose Route Frequency Provider Last Rate Last Admin   0.9 %  sodium chloride  infusion  500 mL Intravenous Continuous Aneita Gwendlyn DASEN, MD       0.9 %  sodium chloride  infusion  500 mL Intravenous Continuous Cybil Senegal V, DO        Allergies as of 03/18/2024 - Review Complete 03/18/2024  Allergen Reaction Noted   Bee venom Swelling 11/02/2013   Penicillins Rash and Other (See Comments) 11/02/2010   Hydrocodone-acetaminophen  Nausea And Vomiting 03/17/2013    Family History  Problem Relation Age of Onset   Cancer Mother        brain   Cancer Brother        Question lung   Drug abuse Maternal Aunt    Colon polyps Neg Hx    Colon cancer Neg Hx    Rectal cancer Neg Hx    Stomach cancer Neg Hx    Esophageal cancer Neg Hx     Social History   Socioeconomic History   Marital status: Single    Spouse name: Not on file   Number of children: Not on file   Years of education: Not on file    Highest education level: Not on file  Occupational History   Not on file  Tobacco Use   Smoking status: Every Day    Current packs/day: 0.50    Types: Cigarettes   Smokeless tobacco: Never  Vaping Use   Vaping status: Never Used  Substance and Sexual Activity   Alcohol  use: Yes    Comment: occassion   Drug use: No   Sexual activity: Not Currently  Other Topics Concern   Not on file  Social History Narrative   Retired. Plays golf 4 days/week.Works in yard. Drinks 6 beers each evening.      Diet: Left blank      Caffeine: yes      Married, if yes what year: Single      Do you live in a house, apartment, assisted living, condo, trailer, ect: House      Is it one or more stories: 2 stories      How many persons live in your home? 2 people      Pets: Yes, dog      Highest level or education completed: 2 years of college      Current/Past profession: [employer]      Exercise:   Yes               Type and how often: Golf and weights         Living Will: No   DNR: No   POA/HPOA: No      Functional Status:   Do you have difficulty bathing or dressing yourself? No   Do you have difficulty preparing food or eating? No   Do you have difficulty managing your medications? No   Do you have difficulty managing your finances? No   Do you have difficulty affording your medications? No   Social Drivers of Health   Tobacco Use: High Risk (03/18/2024)   Patient  History    Smoking Tobacco Use: Every Day    Smokeless Tobacco Use: Never    Passive Exposure: Not on file  Financial Resource Strain: Not on file  Food Insecurity: No Food Insecurity (11/07/2023)   Epic    Worried About Programme Researcher, Broadcasting/film/video in the Last Year: Never true    Ran Out of Food in the Last Year: Never true  Transportation Needs: No Transportation Needs (11/07/2023)   Epic    Lack of Transportation (Medical): No    Lack of Transportation (Non-Medical): No  Physical Activity: Not on file  Stress: Not on file   Social Connections: Moderately Isolated (11/07/2023)   Social Connection and Isolation Panel    Frequency of Communication with Friends and Family: Three times a week    Frequency of Social Gatherings with Friends and Family: Three times a week    Attends Religious Services: More than 4 times per year    Active Member of Clubs or Organizations: No    Attends Banker Meetings: Never    Marital Status: Divorced  Catering Manager Violence: Not At Risk (11/07/2023)   Epic    Fear of Current or Ex-Partner: No    Emotionally Abused: No    Physically Abused: No    Sexually Abused: No  Depression (PHQ2-9): Low Risk (11/07/2023)   Depression (PHQ2-9)    PHQ-2 Score: 0  Alcohol  Screen: Not on file  Housing: Low Risk (11/07/2023)   Epic    Unable to Pay for Housing in the Last Year: No    Number of Times Moved in the Last Year: 0    Homeless in the Last Year: No  Utilities: Not At Risk (11/07/2023)   Epic    Threatened with loss of utilities: No  Health Literacy: Not on file    Physical Exam: Vital signs in last 24 hours: @BP  119/65   Pulse 67   Temp (!) 97.3 F (36.3 C) (Temporal)   Ht 5' 6 (1.676 m)   Wt 177 lb (80.3 kg)   SpO2 97%   BMI 28.57 kg/m  GEN: NAD EYE: Sclerae anicteric ENT: MMM CV: Non-tachycardic Pulm: CTA b/l GI: Soft, NT/ND NEURO:  Alert & Oriented x 3   Sandor Flatter, DO [facility]   03/18/2024 1:48 PM  "

## 2024-03-18 NOTE — Telephone Encounter (Signed)
-----   Message from [facility], NP sent at 03/18/2024  1:24 PM EST ----- GI profile also negative  Deanna, NP

## 2024-03-18 NOTE — Op Note (Signed)
 [facility] Patient Name: Hunter Porter Procedure Date: 03/18/2024 1:25 PM MRN: [id number] Endoscopist: Sandor Flatter , MD, [id number] Age: 79 Referring MD:  Date of Birth: [birth year] Gender: Male Account #: [id number] Procedure:                Colonoscopy Indications:              High risk colon cancer surveillance: Personal                            history of colonic polyps                           Incidental - Change in bowel habits, Diarrhea Medicines:                Monitored Anesthesia Care Procedure:                Pre-Anesthesia Assessment:                           - Prior to the procedure, a History and Physical                            was performed, and patient medications and                            allergies were reviewed. The patient's tolerance of                            previous anesthesia was also reviewed. The risks                            and benefits of the procedure and the sedation                            options and risks were discussed with the patient.                            All questions were answered, and informed consent                            was obtained. Prior Anticoagulants: The patient has                            taken no anticoagulant or antiplatelet agents. ASA                            Grade Assessment: II - A patient with mild systemic                            disease. After reviewing the risks and benefits,                            the patient was deemed in satisfactory condition to  undergo the procedure.                           After obtaining informed consent, the colonoscope                            was passed under direct vision. Throughout the                            procedure, the patient's blood pressure, pulse, and                            oxygen saturations were monitored continuously. The                            Olympus Scope SN [id number] was introduced  through the                            anus and advanced to the the terminal ileum. The                            colonoscopy was performed without difficulty. The                            patient tolerated the procedure well. The quality                            of the bowel preparation was good. The terminal                            ileum, ileocecal valve, appendiceal orifice, and                            rectum were photographed. Scope In: 2:05:32 PM Scope Out: 2:28:56 PM Scope Withdrawal Time: 0 hours 19 minutes 14 seconds  Total Procedure Duration: 0 hours 23 minutes 24 seconds  Findings:                 The perianal and digital rectal examinations were                            normal.                           A 3 mm polyp was found in the cecum. The polyp was                            sessile. The polyp was removed with a cold snare.                            Resection and retrieval were complete. Estimated                            blood loss was minimal.  Two sessile polyps were found in the transverse                            colon. The polyps were 3 to 5 mm in size. These                            polyps were removed with a cold snare. Resection                            and retrieval were complete. Estimated blood loss                            was minimal.                           Four sessile polyps were found in the sigmoid                            colon. The polyps were 3 to 4 mm in size. These                            polyps were removed with a cold snare. Resection                            and retrieval were complete. Estimated blood loss                            was minimal.                           Normal mucosa was found in the entire colon.                            Biopsies for histology were taken with a cold                            forceps from the right colon and left colon for                             evaluation of microscopic colitis. Estimated blood                            loss was minimal.                           The retroflexed view of the distal rectum and anal                            verge was normal and showed no anal or rectal                            abnormalities.  The terminal ileum appeared normal. Complications:            No immediate complications. Estimated Blood Loss:     Estimated blood loss was minimal. Impression:               - One 3 mm polyp in the cecum, removed with a cold                            snare. Resected and retrieved.                           - Two 3 to 5 mm polyps in the transverse colon,                            removed with a cold snare. Resected and retrieved.                           - Four 3 to 4 mm polyps in the sigmoid colon,                            removed with a cold snare. Resected and retrieved.                           - Normal mucosa in the entire examined colon.                            Biopsied.                           - The distal rectum and anal verge are normal on                            retroflexion view.                           - The examined portion of the ileum was normal. Recommendation:           - Patient has a contact number available for                            emergencies. The signs and symptoms of potential                            delayed complications were discussed with the                            patient. Return to normal activities tomorrow.                            Written discharge instructions were provided to the                            patient.                           -  Continue present medications.                           - Await pathology results.                           - Repeat colonoscopy for surveillance based on                            pathology results.                           - Return to GI clinic PRN. Sandor Flatter,  MD 03/18/2024 2:44:47 PM

## 2024-03-19 ENCOUNTER — Telehealth: Payer: Self-pay

## 2024-03-19 NOTE — Telephone Encounter (Signed)
" °  Follow up Call-     03/18/2024    1:09 PM  Call back number  Post procedure Call Back phone  # [phone]  Permission to leave phone message Yes    Attempted to call patient regarding follow-up, no answer. Left VM with instructions to return call for any questions/concerns, severe pain, fever, unable to tolerate food/liquids.  "

## 2024-03-24 ENCOUNTER — Ambulatory Visit: Payer: Self-pay | Admitting: Gastroenterology

## 2024-03-24 LAB — SURGICAL PATHOLOGY

## 2024-03-29 ENCOUNTER — Ambulatory Visit: Payer: Self-pay | Admitting: Nurse Practitioner

## 2024-04-06 ENCOUNTER — Ambulatory Visit: Admitting: Nurse Practitioner

## 2024-11-15 ENCOUNTER — Ambulatory Visit: Payer: Self-pay | Admitting: Nurse Practitioner
